# Patient Record
Sex: Female | Born: 1940 | Race: White | Hispanic: No | Marital: Married | Smoking: Former smoker
Health system: Southern US, Community
[De-identification: ages and names within clinical notes are randomized; demographics above are authoritative.]

## PROBLEM LIST (undated history)

## (undated) DIAGNOSIS — Z9889 Other specified postprocedural states: Secondary | ICD-10-CM

## (undated) DIAGNOSIS — R112 Nausea with vomiting, unspecified: Secondary | ICD-10-CM

## (undated) DIAGNOSIS — I209 Angina pectoris, unspecified: Secondary | ICD-10-CM

## (undated) DIAGNOSIS — E119 Type 2 diabetes mellitus without complications: Secondary | ICD-10-CM

## (undated) DIAGNOSIS — C679 Malignant neoplasm of bladder, unspecified: Secondary | ICD-10-CM

## (undated) DIAGNOSIS — I10 Essential (primary) hypertension: Secondary | ICD-10-CM

## (undated) DIAGNOSIS — E785 Hyperlipidemia, unspecified: Secondary | ICD-10-CM

## (undated) DIAGNOSIS — K219 Gastro-esophageal reflux disease without esophagitis: Secondary | ICD-10-CM

## (undated) DIAGNOSIS — I2510 Atherosclerotic heart disease of native coronary artery without angina pectoris: Secondary | ICD-10-CM

## (undated) DIAGNOSIS — T753XXA Motion sickness, initial encounter: Secondary | ICD-10-CM

## (undated) DIAGNOSIS — N3289 Other specified disorders of bladder: Secondary | ICD-10-CM

## (undated) DIAGNOSIS — R7303 Prediabetes: Secondary | ICD-10-CM

## (undated) DIAGNOSIS — I4891 Unspecified atrial fibrillation: Secondary | ICD-10-CM

## (undated) DIAGNOSIS — R0600 Dyspnea, unspecified: Secondary | ICD-10-CM

## (undated) DIAGNOSIS — K769 Liver disease, unspecified: Secondary | ICD-10-CM

## (undated) HISTORY — DX: Liver disease, unspecified: K76.9

## (undated) HISTORY — PX: CHOLECYSTECTOMY: SHX55

## (undated) HISTORY — PX: APPENDECTOMY: SHX54

## (undated) HISTORY — PX: TOE SURGERY: SHX1073

## (undated) HISTORY — DX: Gastro-esophageal reflux disease without esophagitis: K21.9

## (undated) HISTORY — DX: Other specified disorders of bladder: N32.89

## (undated) HISTORY — DX: Malignant neoplasm of bladder, unspecified: C67.9

## (undated) HISTORY — DX: Type 2 diabetes mellitus without complications: E11.9

## (undated) SURGERY — Surgical Case
Anesthesia: *Unknown

---

## 2005-06-30 ENCOUNTER — Ambulatory Visit: Payer: Self-pay | Admitting: Internal Medicine

## 2006-06-28 ENCOUNTER — Ambulatory Visit: Payer: Self-pay | Admitting: Internal Medicine

## 2006-08-18 ENCOUNTER — Emergency Department: Payer: Self-pay | Admitting: General Practice

## 2006-10-18 ENCOUNTER — Ambulatory Visit: Payer: Self-pay | Admitting: Gastroenterology

## 2007-02-11 ENCOUNTER — Ambulatory Visit: Payer: Self-pay | Admitting: Ophthalmology

## 2007-07-01 ENCOUNTER — Ambulatory Visit: Payer: Self-pay | Admitting: Internal Medicine

## 2008-07-23 ENCOUNTER — Ambulatory Visit: Payer: Self-pay | Admitting: Internal Medicine

## 2009-07-27 ENCOUNTER — Ambulatory Visit: Payer: Self-pay | Admitting: Internal Medicine

## 2010-07-28 ENCOUNTER — Ambulatory Visit: Payer: Self-pay | Admitting: Internal Medicine

## 2011-07-31 ENCOUNTER — Ambulatory Visit: Payer: Self-pay | Admitting: Internal Medicine

## 2011-12-15 ENCOUNTER — Ambulatory Visit: Payer: Self-pay | Admitting: Gastroenterology

## 2011-12-18 LAB — PATHOLOGY REPORT

## 2011-12-21 ENCOUNTER — Ambulatory Visit: Payer: Self-pay | Admitting: Podiatry

## 2012-06-06 ENCOUNTER — Inpatient Hospital Stay: Payer: Self-pay | Admitting: Internal Medicine

## 2012-06-06 LAB — URINALYSIS, COMPLETE
Bilirubin,UR: NEGATIVE
Glucose,UR: NEGATIVE mg/dL (ref 0–75)
Nitrite: NEGATIVE
Ph: 5 (ref 4.5–8.0)
Protein: 30
Squamous Epithelial: 12
WBC UR: 27 /HPF (ref 0–5)

## 2012-06-06 LAB — COMPREHENSIVE METABOLIC PANEL
Alkaline Phosphatase: 98 U/L (ref 50–136)
Anion Gap: 11 (ref 7–16)
Bilirubin,Total: 1.8 mg/dL — ABNORMAL HIGH (ref 0.2–1.0)
Calcium, Total: 9.2 mg/dL (ref 8.5–10.1)
Chloride: 99 mmol/L (ref 98–107)
Co2: 24 mmol/L (ref 21–32)
EGFR (Non-African Amer.): 32 — ABNORMAL LOW
Osmolality: 279 (ref 275–301)
Potassium: 3.7 mmol/L (ref 3.5–5.1)
Total Protein: 7.7 g/dL (ref 6.4–8.2)

## 2012-06-06 LAB — CBC WITH DIFFERENTIAL/PLATELET
Basophil #: 0.1 10*3/uL (ref 0.0–0.1)
Basophil %: 0.4 %
HGB: 16.3 g/dL — ABNORMAL HIGH (ref 12.0–16.0)
Lymphocyte #: 1.9 10*3/uL (ref 1.0–3.6)
MCH: 33.1 pg (ref 26.0–34.0)
MCHC: 35 g/dL (ref 32.0–36.0)
Monocyte #: 1.7 x10 3/mm — ABNORMAL HIGH (ref 0.2–0.9)
Monocyte %: 11.6 %
Neutrophil #: 11.1 10*3/uL — ABNORMAL HIGH (ref 1.4–6.5)
RBC: 4.93 10*6/uL (ref 3.80–5.20)
RDW: 13 % (ref 11.5–14.5)

## 2012-06-06 LAB — TROPONIN I: Troponin-I: <0.02 ng/mL

## 2012-06-06 LAB — LIPASE, BLOOD: Lipase: 101 U/L (ref 73–393)

## 2012-06-07 LAB — CBC WITH DIFFERENTIAL/PLATELET
Basophil #: 0 10*3/uL (ref 0.0–0.1)
HCT: 36.5 % (ref 35.0–47.0)
Lymphocyte #: 1.4 10*3/uL (ref 1.0–3.6)
MCH: 33.4 pg (ref 26.0–34.0)
MCHC: 35.5 g/dL (ref 32.0–36.0)
MCV: 94 fL (ref 80–100)
Monocyte #: 1 x10 3/mm — ABNORMAL HIGH (ref 0.2–0.9)
Monocyte %: 12.7 %
Neutrophil #: 5.6 10*3/uL (ref 1.4–6.5)
Neutrophil %: 68.5 %
Platelet: 183 10*3/uL (ref 150–440)
RDW: 13 % (ref 11.5–14.5)
WBC: 8.2 10*3/uL (ref 3.6–11.0)

## 2012-06-07 LAB — HEPATIC FUNCTION PANEL A (ARMC)
Albumin: 2.6 g/dL — ABNORMAL LOW (ref 3.4–5.0)
Bilirubin, Direct: 0.2 mg/dL (ref 0.00–0.20)
Bilirubin,Total: 0.9 mg/dL (ref 0.2–1.0)
Total Protein: 5.6 g/dL — ABNORMAL LOW (ref 6.4–8.2)

## 2012-06-07 LAB — BASIC METABOLIC PANEL
Anion Gap: 5 — ABNORMAL LOW (ref 7–16)
Calcium, Total: 8.3 mg/dL — ABNORMAL LOW (ref 8.5–10.1)
Co2: 27 mmol/L (ref 21–32)
Creatinine: 1.04 mg/dL (ref 0.60–1.30)
EGFR (African American): >60
Glucose: 153 mg/dL — ABNORMAL HIGH (ref 65–99)
Osmolality: 278 (ref 275–301)
Sodium: 137 mmol/L (ref 136–145)

## 2012-06-07 LAB — WBCS, STOOL

## 2012-06-07 LAB — MAGNESIUM: Magnesium: 1.9 mg/dL

## 2012-06-08 LAB — CBC WITH DIFFERENTIAL/PLATELET
Eosinophil #: 0.1 10*3/uL (ref 0.0–0.7)
Eosinophil %: 2.1 %
Lymphocyte #: 1.2 10*3/uL (ref 1.0–3.6)
Lymphocyte %: 21.5 %
MCH: 34.3 pg — ABNORMAL HIGH (ref 26.0–34.0)
MCHC: 36.2 g/dL — ABNORMAL HIGH (ref 32.0–36.0)
MCV: 95 fL (ref 80–100)
Monocyte #: 0.9 x10 3/mm (ref 0.2–0.9)
Neutrophil #: 3.3 10*3/uL (ref 1.4–6.5)
Neutrophil %: 59.3 %
Platelet: 158 10*3/uL (ref 150–440)
RBC: 3.44 10*6/uL — ABNORMAL LOW (ref 3.80–5.20)

## 2012-06-08 LAB — COMPREHENSIVE METABOLIC PANEL
Anion Gap: 7 (ref 7–16)
BUN: 5 mg/dL — ABNORMAL LOW (ref 7–18)
Bilirubin,Total: 0.5 mg/dL (ref 0.2–1.0)
Calcium, Total: 7.9 mg/dL — ABNORMAL LOW (ref 8.5–10.1)
Creatinine: 0.9 mg/dL (ref 0.60–1.30)
EGFR (African American): >60
EGFR (Non-African Amer.): >60
Glucose: 113 mg/dL — ABNORMAL HIGH (ref 65–99)
Osmolality: 281 (ref 275–301)
Potassium: 3.9 mmol/L (ref 3.5–5.1)
SGOT(AST): 35 U/L (ref 15–37)
Sodium: 142 mmol/L (ref 136–145)

## 2012-06-08 LAB — URINE CULTURE

## 2012-06-09 LAB — BASIC METABOLIC PANEL
Anion Gap: 6 — ABNORMAL LOW (ref 7–16)
Calcium, Total: 8.2 mg/dL — ABNORMAL LOW (ref 8.5–10.1)
Co2: 26 mmol/L (ref 21–32)
EGFR (African American): >60
Glucose: 147 mg/dL — ABNORMAL HIGH (ref 65–99)
Osmolality: 280 (ref 275–301)
Sodium: 141 mmol/L (ref 136–145)

## 2012-06-09 LAB — STOOL CULTURE

## 2012-06-25 LAB — STOOL CULTURE

## 2012-07-31 ENCOUNTER — Ambulatory Visit: Payer: Self-pay | Admitting: Internal Medicine

## 2013-10-22 ENCOUNTER — Ambulatory Visit: Payer: Self-pay | Admitting: Internal Medicine

## 2013-12-22 ENCOUNTER — Ambulatory Visit: Payer: Self-pay | Admitting: Urology

## 2014-01-12 ENCOUNTER — Ambulatory Visit: Payer: Self-pay | Admitting: Urology

## 2014-01-16 LAB — PATHOLOGY REPORT

## 2014-01-27 ENCOUNTER — Ambulatory Visit: Payer: Self-pay | Admitting: Urology

## 2014-03-04 ENCOUNTER — Ambulatory Visit: Payer: Self-pay | Admitting: Ophthalmology

## 2014-04-01 DIAGNOSIS — K219 Gastro-esophageal reflux disease without esophagitis: Secondary | ICD-10-CM | POA: Insufficient documentation

## 2014-04-01 DIAGNOSIS — E119 Type 2 diabetes mellitus without complications: Secondary | ICD-10-CM | POA: Insufficient documentation

## 2014-07-31 ENCOUNTER — Ambulatory Visit: Payer: Self-pay | Admitting: Internal Medicine

## 2014-08-20 ENCOUNTER — Ambulatory Visit: Payer: Self-pay | Admitting: Urology

## 2014-08-20 LAB — COMPREHENSIVE METABOLIC PANEL
ALK PHOS: 79 U/L
ANION GAP: 6 — AB (ref 7–16)
Albumin: 3.7 g/dL (ref 3.4–5.0)
BUN: 16 mg/dL (ref 7–18)
Bilirubin,Total: 0.7 mg/dL (ref 0.2–1.0)
CALCIUM: 9.3 mg/dL (ref 8.5–10.1)
CREATININE: 0.99 mg/dL (ref 0.60–1.30)
Chloride: 105 mmol/L (ref 98–107)
Co2: 29 mmol/L (ref 21–32)
GFR CALC NON AF AMER: 59 — AB
Glucose: 116 mg/dL — ABNORMAL HIGH (ref 65–99)
OSMOLALITY: 282 (ref 275–301)
Potassium: 4.6 mmol/L (ref 3.5–5.1)
SGOT(AST): 35 U/L (ref 15–37)
SGPT (ALT): 55 U/L
Sodium: 140 mmol/L (ref 136–145)
TOTAL PROTEIN: 6.8 g/dL (ref 6.4–8.2)

## 2014-09-04 ENCOUNTER — Ambulatory Visit: Payer: Self-pay | Admitting: Urology

## 2014-11-19 ENCOUNTER — Inpatient Hospital Stay: Payer: Self-pay | Admitting: Surgery

## 2015-01-19 NOTE — Discharge Summary (Signed)
PATIENT NAME:  Adriana Anderson, Adriana Anderson MR#:  [id number] DATE OF BIRTH:  09-18-1941  DATE OF ADMISSION:  06/06/2012 DATE OF DISCHARGE:  06/10/2012  HISTORY OF PRESENT ILLNESS: Ms. Satterfield is a 74 year old white lady who presented to the Emergency Room complaining of 4 to 5 days of nausea, vomiting, and diarrhea. She also had blood per rectum because of the diffuse diarrhea. In the Emergency Room, a CT scan of the abdomen was suggestive of colitis and she was therefore admitted.   PAST MEDICAL HISTORY:  1. Gastroesophageal reflux disease.  2. Osteopenia.  3. History of urinary incontinence.   ALLERGIES: The patient was noted to be allergic to sulfa.   ADMISSION MEDICATIONS:   1. Omeprazole 20 mg daily.  2. Calcium plus vitamin D b.i.d.   ADMISSION PHYSICAL EXAMINATION: As described by the admitting physician revealed a temperature of 97.1,  pulse of 111,  respirations 20, and blood pressure 144/84. Mucous membranes were noted to be dry. Abdomen was soft but there was tenderness without rebound in both lower quadrants. Bowel sounds were somewhat hyperactive. Rectal exam was not described by the admitting physician.   LABS/STUDIES: Admission CBC showed a hemoglobin of 16.3 with hematocrit of 46.6. Platelet count was 250,000. White count was 14,700. Admission urinalysis showed 1+ blood and 30 mg/dl of protein. There was 3+ leukocyte esterase. Microscopic exam showed 27 WBCs per high-powered field and 1+ bacteria. Admission electrocardiogram showed a sinus tachycardia with ST-T wave changes in the inferior leads. Abdominal CT of the abdomen and pelvis showed thickening of the wall of the colon in the transverse colon distally. It was suggestive of an infectious colitis.    HOSPITAL COURSE: The patient was admitted to the regular medical floor where she was rehydrated with IV fluids. She was placed on a restricted diet plus IV antibiotics pending culture reports. Stool for WBCs showed  80% WBCs. 20% were  mononuclear cells. Stool for C. difficile was negative. Stool culture was positive for Salmonella, sensitive to ampicillin, ciprofloxacin, sulfa, and levofloxacin.  Urine culture showed mixed bacterial organisms suggestive of contamination.   HOSPITAL COURSE: With rehydration and IV antibiotics the patient improved rather quickly. She was eventually switched to p.o. antibiotics and had her diet progressed. She was ambulated without difficulty.   DISCHARGE DIAGNOSIS: Salmonella dysentery.   DISCHARGE MEDICATIONS: The patient was discharged on Levaquin 500 mg daily for an additional seven days.   DISCHARGE DISPOSITION: The patient was discharged on a regular diet, but was advised to eat light for the first meal. She had no activity restrictions. She is to be followed up in the office in 4 to 6 weeks.     ____________________________ Hewitt Blade Sarina Ser, MD jbw:bjt D: 06/23/2012 16:03:02 ET T: 06/25/2012 09:49:02 ET JOB#: [id number]  cc: Jenny Reichmann B. Sarina Ser, MD, <Dictator> Lottie Mussel III MD ELECTRONICALLY SIGNED 06/25/2012 12:25

## 2015-01-19 NOTE — Consult Note (Signed)
PATIENT NAME:  Adriana Anderson, Adriana Anderson MR#:  [id number] DATE OF BIRTH:  07/17/1941  DATE OF CONSULTATION:  06/06/2012  CONSULTING PHYSICIAN:  Payton Emerald, NP/Paul Oh, MD PRIMARY CARE DOCTOR: Hewitt Blade. Sarina Ser, MD ATTENDING: Serita Grit, MD  REASON FOR CONSULTATION: Colitis.  HISTORY OF PRESENT ILLNESS: Adriana Anderson is a 74 year old Caucasian female who presented to [facility] emergency room today with a 5-day history of change in bowel habits. Sunday, acute onset of diarrhea with bowel movements being every 15 minutes, consistency very watery. Her normal bowel pattern is once a day. Known history of hemorrhoids and does notice a speck of bright red blood at times with straining to defecate. Personal history of colonic polyps in the past. Most recent colonoscopy was performed by Dr. Loistine Simas in 2008, unremarkable findings. For 5 days she has had loss of appetite. For 2 days she has really had no substantial chloric intake. Severe cramping to lower abdomen bilateral quadrants. Bowel habit has changed to maybe once an hour at this time but has been continuing a day and overnight. Saturday, low-grade fever, Sunday chills. This morning evidence of bright red blood, several occurrences yesterday, was pinkish in color initially.  Cramping still remains very prominent to lower abdomen with defecation, is mildly relieved at times, associated nausea, vomited Sunday, 10-pound weight loss since Sunday. No chest pain. No shortness of breath, dizziness. Feeling like she is going to pass out this morning with defecation.   No recent antibiotic use, travelled to [place], [place] August 15 through August 18.  Did consume oysters, fried oysters as well as crab cake and also ate oyster stew a week ago. She does live [place] a farm, has donkey, horses, chickens, goats, cows, cats, and dogs. She is not usually the primary caretaker of the animals.     PAST MEDICAL HISTORY: Urinary incontinence, gastroesophageal reflux disease,  hyperglycemia, osteopenia.  PAST SURGICAL HISTORY: Toe surgery.   HOME MEDICATIONS:  1. Lomotil 0.025 mg/2.5 mg 1 tablet every 6 hours as needed for diarrhea.  2. Calcium plus vitamin D 1 tablet twice a day.  3. Omeprazole 20 mg daily. 4. Promethazine 25 mg every 6 hours as needed. 5. Detrol 25 mg 1 tablet twice a day.   ALLERGIES: Sulfa.   FAMILY HISTORY: Father history of GU cancer. Uncle, lung cancer.   SOCIAL HISTORY: No tobacco use. Social alcohol consumption. Married.   REVIEW OF SYSTEMS: CONSTITUTIONAL: Significant for fever, fatigue, weakness, 10-pound weight loss since Sunday. EYES: No blurred vision, double vision, redness. Does have cataracts. No glaucoma. ENT: No tinnitus. No ear pain, hearing loss. RESPIRATORY: No coughing, no wheezing. CARDIOVASCULAR: No chest pain, orthopnea, no heart palpitations. Significant for dizziness. GI: See history of present illness. GU: No dysuria, hematuria. Significant for urinary incontinence. ENDOCRINE: No polyuria, nocturia, thyroid problems. HEME/LYMPH: Denies any anemia, easy bruising or bleeding. SKIN: No lesions. No rashes. MUSCULOSKELETAL: No neuralgias or myalgias. NEUROLOGIC: No numbness, no cerebrovascular accident, history of transient ischemic attacks, seizures. PSYCH: No depression. No anxiety.   PHYSICAL EXAMINATION:  VITAL SIGNS: Temperature is 98.2 with a pulse 101, respirations are 20, blood pressure is 148/72 with a pulse oximetry of 97%.    GENERAL: Well-developed, well nourished 74 year old Caucasian female, no acute distress noted but does appear mildly ill.   HEENT: Normocephalic, atraumatic. Pupils equal, reactive to light. Conjunctiva clear. Sclerae anicteric.   NECK: Supple. Trachea midline. No lymphadenopathy or thyromegaly.   PULMONARY: Symmetric rise and fall of chest. Clear to auscultation  throughout.   CARDIOVASCULAR: Regular rhythm, S1, S2. No murmurs, no gallops.   ABDOMEN: Soft, nondistended. Bowel sounds  hyperactive. No bruits. No masses. Marked discomfort noted bilateral lower quadrants of abdomen.   RECTAL: Deferred.   MUSCULOSKELETAL: Moving all 4 extremities. No contractures. No clubbing.   EXTREMITIES: No edema.   PSYCH: Alert and oriented x4. Memory grossly intact. Appropriate affect and mood.   LABORATORY/DIAGNOSTICS: Chemistry panel: Glucose is 149, BUN is 35, creatinine  is 1.62, sodium is 134. EGFR is 32, otherwise within normal limits. Lipase is 101. Hepatic panel: Total protein is 7.7, albumin is 3.7, total bilirubin is 1.8, alkaline phosphatase is 98, AST is 40, and ALT is 54. Troponin is less than 0.02. CBC: WBC count is 14.7, hemoglobin is 16.3, otherwise within normal limits. Differential revealed neutrophil number elevated at 11.1 with a monocyte elevation of 1.7. Antibody screen negative. ABO group is A+. C. difficile is negative. Urinalysis:  +1 blood, +3 leukocytes, protein is 30 mg/dL, RBC is 4 per high-power, WBC is 27 per high-power field, hyaline casts are 202 per low-power field.   CT scan abdomen and pelvis without contrast revealed thickening of the wall of the colon from the transverse colon distally to the rectum. A small amount of increased density is noted in the pericolonic fat predominantly associated with the descending colon. No evidence of an abscess or free air, no evidence of bowel obstruction. Urinary bladder, uterus, and adnexal structures are normal in appearance. The terminal ileum demonstrates minimal wall thickening, possibly related to incomplete distention wall thickening of the ascending colon, appears to be less throughout the remainder of the colon. Stomach is mildly distended with contrast. The jejunum/ ileum does not exhibit a significant luminal narrowing or wall thickening.   IMPRESSION: Acute onset of abdominal pain bilateral lower quadrants with diarrhea. CT scan of abdomen and pelvis without contrast revealing evidence of colitis. Suspect probably  infectious. Ischemic is also a differential.   PLAN: The patient's presentation was discussed with Dr. Verdie Shire. Pending stool studies which have been ordered, comprehensive stool, stool for WBC. Additionally added ova and parasite as well as giardia.  We will have patient continue with ciprofloxacin as well as metronidazole as  ordered. We will continue to monitor laboratory studies given the evidence of lower GI bleed again, probable in correlation with infectious process versus ischemic.        These services provided by Payton Emerald, NP, under collaborative agreement with Verdie Shire, MD.      ____________________________ Payton Emerald, NP dsh:vtd D: 06/06/2012 17:22:09 ET T: 06/07/2012 09:44:39 ET JOB#: [id number]  cc: Payton Emerald, NP, <Dictator> Payton Emerald MD ELECTRONICALLY SIGNED 06/11/2012 14:35

## 2015-01-19 NOTE — Consult Note (Signed)
Brief Consult Note: Diagnosis: Acute onset of abdominal pain with associated change in bowel habits.  Experiencing diarrhea with evidence of lower GI bleed.  CT scan of abdomen and pelvis revealed evidence of colitis.  Probable infectious in nature but ischemic also of consideration.   Consult note dictated.   Orders entered.   Discussed with Attending MD.   Comments: Patient's presentation discussed with Dr. Verdie Shire.  Pending stool study results.  Comprehensive stool, Stool for WBC and O&P with Giardia added.  Diffential inclusive of infectious vs ischemic.  Continue with current antibiotic therapy.  Will continue to monitor hemodynamic status as well as laboratory studies.  Transfuse as necessary.  Electronic Signatures: Payton Emerald (NP)  (Signed 05-Sep-13 17:24)  Authored: Brief Consult Note   Last Updated: 05-Sep-13 17:24 by Payton Emerald (NP)

## 2015-01-19 NOTE — H&P (Signed)
PATIENT NAME:  Adriana Anderson, Adriana Anderson MR#:  [id number] DATE OF BIRTH:  [birth year]  DATE OF ADMISSION:  06/06/2012  PRIMARY CARE PHYSICIAN: Dr. Gilford Rile, III ED REFERRING PHYSICIAN: Dr. Jimmye Norman   CHIEF COMPLAINT: Diarrhea, abdominal pain, bright red blood per rectum.   HISTORY OF PRESENT ILLNESS: The patient is a 74 year old Caucasian female with history of urinary incontinence, gastroesophageal reflux disease, and osteoporosis who states that she was doing well until last Saturday when she went to an auction. She reports that she did not do anything unusual, she did eat a sausage dog there on Saturday, then that evening did not feel well and then subsequently the next day started having diarrhea. She reports that the diarrhea was basically severe and everything she ate started going through her.  Nobody else in the family is sick.  She stopped eating two days ago because of fear that anything she ate would cause her to have diarrhea. She also had felt hot but did not check her temperature on Sunday. She reports that the diarrhea persisted and then two days ago started noticing some bright red blood per rectum. It was just minimal on the first day and then today was worse and she also started feeling dizzy earlier today. Therefore, she came to the Emergency Department.  In the Emergency Department she had a  CT scan of the abdomen which showed colitis. She otherwise denies any chest pains. No syncope. She did feel like she was going to pass out. The patient had a colonoscopy in 2008 which just showed a colonic polyp.  PAST MEDICAL HISTORY:  1. Urinary incontinence.  2. Gastroesophageal reflux disease.  3. Osteopenia.   PAST SURGICAL HISTORY: None.   ALLERGIES: Sulfa.  CURRENT HOME MEDICATIONS:  1. Atropine diphenoxylate 0.025 mg/2.5, 1 tab p.o. q. 6 p.r.n. for diarrhea. 2. Calcium plus vitamin D 1 tab p.o. b.i.d. 3. Omeprazole 20 mg daily.  4. Promethazine 25 q. 6 p.r.n. nausea, vomiting.  5. Detrol 2  mg 1 tab p.o. b.i.d.   SOCIAL HISTORY: Does not smoke. Does not drink. No drugs.   FAMILY HISTORY: No history of colon cancer or other inflammatory bowel disease.   REVIEW OF SYSTEMS:  CONSTITUTIONAL: Complains of subjective fever, fatigue, and weakness, abdominal pain in the lower abdomen. No weight loss. No weight gain. EYES: No blurred or double vision. No pain. No redness. No inflammation. Does have cataracts. No glaucoma. ENT: No tinnitus. No ear pain. No hearing loss. No seasonal or year-round allergies. No difficulty swallowing. RESPIRATORY: No cough. No wheezing. No hemoptysis. No chronic obstructive pulmonary disease. No tuberculosis. CARDIOVASCULAR: No chest pain. No orthopnea. No edema. No arrhythmia.  He did feel dizzy earlier. GI: No vomiting. Has had nausea and diarrhea as above. Abdominal pain in the right and left lower quadrants. No hematemesis. Complains of bright red blood per rectum. Has gastroesophageal reflux disease. No history of irritable bowel syndrome. No jaundice. GU: Denies any dysuria, hematuria, or renal calculus. Does have urinary incontinence. ENDO: Denies any polyuria, nocturia, or thyroid problems. HEME/LYMPH: Denies any anemia, easy bruisability, or bleeding. SKIN: No acne. No rash. No changes in mole, hair, or skin. MUSCULOSKELETAL: Denies any pain in the neck, back, or shoulder. NEURO: No numbness. No cerebrovascular accident. No transient ischemic attack. No seizures. PSYCHIATRIC: No anxiety. No insomnia. No ADD. No OCD.   PHYSICAL EXAMINATION:  VITAL SIGNS: Temperature 97.1, pulse 111, respirations 20, blood pressure 144/84, O2 99%.   GENERAL: The patient is a well developed,  well nourished Caucasian female, appears comfortable, in no acute distress.   HEENT: Head atraumatic, normocephalic. Pupils equally round, reactive to light and accommodation. There is no conjunctival pallor. No scleral icterus.  Oropharynx is dry. Nasal exam shows no drainage or ulceration.    NECK: No thyromegaly. No carotid bruits.   CARDIOVASCULAR: Regular rate and rhythm. No murmurs, rubs, clicks, or gallops. PMI is not displaced.   LUNGS: No accessory muscle usage. Clear to auscultation bilaterally without any rales, rhonchi, or wheezing.   ABDOMEN: Soft. There is tenderness in the bilateral lower quadrants. There is no guarding, no rebound. Hyperactive bowel sounds.   EXTREMITIES: No clubbing, cyanosis, or edema.   SKIN: No rash.   LYMPHATICS: No lymph nodes palpable.   VASCULAR: Good DP, PT pulses.   PSYCHIATRIC: Not anxious or depressed.   NEUROLOGICAL: Awake, alert, oriented times three. No focal deficits.   SKIN: No rash.   EVALUATIONS: CT scan of the abdomen shows findings consistent with diffuse thickening of the wall of the colon from the transverse colon distally. No small bowel or stomach abnormality noted. Mild distention of the gallbladder. No evidence of gallbladder thickening. C. difficile is negative. Urinalysis shows leukocytes 3+, WBCs 27. WBC 14.7, hemoglobin 16.3, platelet count 250, glucose 149, BUN 35, creatinine 1.62, sodium 134, potassium 3.7, chloride 99, CO2 24. LFTs showed a bilirubin total that was slightly elevated at 1.8. AST 40, lipase 101. Troponin less than 0.02. EKG showed nonspecific T-wave changes.   ASSESSMENT AND PLAN: The patient is a 74 year old Caucasian female with a history of osteoporosis, urinary incontinence, and gastroesophageal reflux disease who presents with abdominal pain and diarrhea times four to five days with bright red blood per rectum.  1. Acute colitis: Differential diagnosis includes possible infectious. Her stool is currently negative for Clostridium difficile. We will get comprehensive stool cultures. We will place her on Cipro and Flagyl for empiric antibiotics. Also, this could be ischemic colitis. We will provide her with supportive care and monitor her blood pressure. Give her IV fluids. We will have GI  evaluate the patient. Depending on her recovery she may need further evaluation with a sigmoidoscopy with biopsy or colonoscopy.  2. Bright red blood per rectum, possibly due to acute colitis. Could also be related to just her hemorrhoids flaring due to recurrent diarrhea. Her hemoglobin is currently stable. We will follow her hemoglobin and hematocrit.  3. Dehydration due to diarrhea. We will give her IV fluids.  4. Abnormal bilirubin total. We will repeat LFTs in the a.m.  5. Gastroesophageal reflux disease. We will continue PPIs.  6. Acute renal failure. We will give her IV fluids. Follow her renal function.  7. We will place her on TEDs for deep vein thrombosis prophylaxis.   Please note the patient is Dr. Thomes Dinning patient.  We will transfer her service later today to Dr. Gilford Rile who will assume her care in the morning.  TIME SPENT:  45 minutes.   ____________________________ Lafonda Mosses Posey Pronto, MD shp:bjt D: 06/06/2012 52:77:82 ET T: 06/06/2012 15:34:26 ET JOB#: [id number]  cc: Keith Cancio H. Posey Pronto, MD, <Dictator> John B. Sarina Ser, MD Alric Seton MD ELECTRONICALLY SIGNED 06/06/2012 20:40

## 2015-01-19 NOTE — Consult Note (Signed)
CC: diarrhea, presumed infectious.  Pt abd feels better, mild tenderness in suprapubic and LLQ, no pain with coughing.  Slept 7 hours without diarrhea last night.  Eating real food at this time.  She seems to be responding to the antibiotics and likely will not need a colonoscopy at this time.  Will decide for sure tomorrow.  Electronic Signatures: Manya Silvas (MD)  (Signed on 07-Sep-13 13:00)  Authored  Last Updated: 07-Sep-13 13:00 by Manya Silvas (MD)

## 2015-01-19 NOTE — Consult Note (Signed)
Chief Complaint:   Subjective/Chief Complaint Feeling better with less abd pain and no bleeding but still with diarrhea.   VITAL SIGNS/ANCILLARY NOTES: **Vital Signs.:   06-Sep-13 07:55   Vital Signs Type Routine   Temperature Temperature (F) 97.7   Celsius 36.5   Temperature Source Oral   Pulse Pulse 94   Respirations Respirations 20   Systolic BP Systolic BP 354   Diastolic BP (mmHg) Diastolic BP (mmHg) 77   Mean BP 95   Pulse Ox % Pulse Ox % 98   Pulse Ox Activity Level  At rest   Oxygen Delivery Room Air/ 21 %   Brief Assessment:   Cardiac Regular    Respiratory clear BS    Gastrointestinal mild LLQ abd tenderness   Lab Results: Hepatic:  06-Sep-13 04:53    Bilirubin, Total 0.9   Bilirubin, Direct 0.2 (Result(s) reported on 07 Jun 2012 at 05:36AM.)   Alkaline Phosphatase 68   SGPT (ALT) 44   SGOT (AST) 35   Total Protein, Serum  5.6   Albumin, Serum  2.6  Routine Chem:  06-Sep-13 04:53    Glucose, Serum  153   BUN 17   Creatinine (comp) 1.04   Sodium, Serum 137   Potassium, Serum  3.3   Chloride, Serum 105   CO2, Serum 27   Calcium (Total), Serum  8.3   Anion Gap  5   Osmolality (calc) 278   eGFR (African American) >60   eGFR (Non-African American)  54 (eGFR values <68m/min/1.73 m2 may be an indication of chronic kidney disease (CKD). Calculated eGFR is useful in patients with stable renal function. The eGFR calculation will not be reliable in acutely ill patients when serum creatinine is changing rapidly. It is not useful in  patients on dialysis. The eGFR calculation may not be applicable to patients at the low and high extremes of body sizes, pregnant women, and vegetarians.)   Magnesium, Serum 1.9 (1.8-2.4 THERAPEUTIC RANGE: 4-7 mg/dL TOXIC: > 10 mg/dL  -----------------------)  Routine Hem:  06-Sep-13 04:53    WBC (CBC) 8.2   RBC (CBC) 3.87   Hemoglobin (CBC) 12.9   Hematocrit (CBC) 36.5   Platelet Count (CBC) 183   MCV 94   MCH 33.4    MCHC 35.5   RDW 13.0   Neutrophil % 68.5   Lymphocyte % 16.8   Monocyte % 12.7   Eosinophil % 1.5   Basophil % 0.5   Neutrophil # 5.6   Lymphocyte # 1.4   Monocyte #  1.0   Eosinophil # 0.1   Basophil # 0.0 (Result(s) reported on 07 Jun 2012 at 05:16AM.)   Assessment/Plan:  Assessment/Plan:   Assessment Colitis, prob infectious.    Plan Continue Abx. Advance diet as tolerated. If no improvement, colon on Monday. If improved, then no further GI w/u now. Dr. EVira Agarto see patient over the weekend. THanks.   Electronic Signatures: OVerdie Shire(MD)  (Signed 06-Sep-13 13:02)  Authored: Chief Complaint, VITAL SIGNS/ANCILLARY NOTES, Brief Assessment, Lab Results, Assessment/Plan   Last Updated: 06-Sep-13 13:02 by OVerdie Shire(MD)

## 2015-01-19 NOTE — Consult Note (Signed)
Pt seen and examined. See Adriana Anderson's notes. Signif diarrhea with bleeding. Bleeding has stopped. CT shows left sided colitis. Likely infectious though ischemic also possible based on location of colitis. Agree with cipro/flagyl. Hx of colon polyps. Last colon in 2008. If sxs resolve with Abx, then colon later as outpt. If no relief, then flex sig/colon by Monday. Will follow. Thanks.  Electronic Signatures: Verdie Shire (MD)  (Signed on 05-Sep-13 21:17)  Authored  Last Updated: 05-Sep-13 21:17 by Verdie Shire (MD)

## 2015-01-23 NOTE — Op Note (Signed)
PATIENT NAME:  Adriana Anderson, Adriana Anderson MR#:  [id number] DATE OF BIRTH:  04/19/1941  DATE OF PROCEDURE:  01/12/2014  PREOPERATIVE DIAGNOSIS:  Large midline bladder tumor.  POSTOPERATIVE DIAGNOSIS: Large midline bladder tumor.    PROCEDURE: Cystoscopy with transurethral resection of bladder of a medium to large size bladder tumor.   ANESTHESIA: General.   COMPLICATIONS: None.   ESTIMATED BLOOD LOSS: 30 mL.  DESCRIPTION OF PROCEDURE: With the patient sterilely prepped and draped in supine lithotomy position under good relaxation from a general anesthetic and after an appropriate timeout with discussion of the procedure and the indications for same, the procedure is begun on the patient.  Has a 26-French constant flow irrigation saline resectoscope introduced into the bladder. The tumor is easily seen in the posterior wall extending down almost to the floor of the bladder. It is foul-looking tumor. It is resected utilizing constant irrigation and electrocautery for both cutting and for coagulation and the normal saline allows [place] to operate freely for as long as needed.  there is an extensive> vascular supply of this tumor, but I immediately stop any major bleeders. The resection is complete. The fragments are evacuated out with a syringe and sent to pathology for analysis. Depth of penetration of the resectoscope is good, but I do extend it into fat. I can see bladder muscle in the resectoscope with no fat in the resection. So she tolerates the procedure well and once I completely dried up any oozing or bleeding with electrocautery, I empty the bladder utilizing a 26-French Foley catheter with 15 mL in the balloon. There is nice clear drainage from this catheter with minimal bleeding and at the most light pink in irrigation. So this allows me to put 40 mg of mitomycin into the bladder immediately after I have put Marcaine in the bladder. The bladder is then plugged and she is sent to recovery in satisfactory  condition. The mitomycin will be emptied out in 1 hour. The patient has a B and O suppository placed. Follow up is in the recovery room. I will determine whether she goes home or not.   ____________________________ Janice Coffin. Elnoria Howard, [place] rdh:ce D: 01/12/2014 13:43:17 ET T: 01/12/2014 14:32:15 ET JOB#: [id number]  cc: Janice Coffin. Elnoria Howard, DO, <Dictator> RICHARD D HART DO ELECTRONICALLY SIGNED 01/30/2014 8:15

## 2015-01-23 NOTE — Op Note (Signed)
PATIENT NAME:  Adriana Anderson, Adriana Anderson MR#:  [id number] DATE OF BIRTH:  17-Jun-1941  DATE OF PROCEDURE:  09/04/2014  PREOPERATIVE DIAGNOSIS: Recurrent bladder tumor.   POSTOPERATIVE DIAGNOSIS: Recurrent bladder tumor.   DESCRIPTION OF PROCEDURE: With the patient sterilely prepped and draped in supine lithotomy position for ease of approach to external genitalia and under good relaxation from a general anesthetic, we have a timeout. Timeout is agreed to by all parties present and we begin the procedure.   PROCEDURE: Transurethral resection of bladder tumor medium size bladder tumor.    DESCRIPTION OF PROCEDURE:  The TURBT begins with resection of a tumor on the right lateral floor of the bladder just proximal and over the intermural portion of the right ureter. Resection continues along through this area. I stay pretty shallow. The patient tolerates the procedure well. Bladder fragments are evacuated with suction and sent to pathology. The bladder is checked, 30 mL of Marcaine are placed in the bladder. Fulguration of the base of the tumor has resulted in a complete hemostasis. There is no bleeding from the bladder at this point. B and O suppository is placed and she is sent to recovery in satisfactory condition. The resectoscope was constant irrigation and suction, resectoscope with normal saline irrigation. The normal saline irrigation created a safe milieu for resection of this bladder tumor. There was no fat showing in the base of the tumor. However it was through muscle at 2 or 3 points of this resection. There were no other tumor sites present. The site of the previous tumor was proximal to the site of this possible recurrent tumor. The patient is sent to recovery in satisfactory condition.     ____________________________ Janice Coffin. Elnoria Howard, DO rdh:bu D: 09/04/2014 14:39:47 ET T: 09/04/2014 15:00:09 ET JOB#: [id number]  cc: Janice Coffin. Elnoria Howard, DO, <Dictator> America Sandall D Avi Archuleta DO ELECTRONICALLY SIGNED  09/16/2014 13:12

## 2015-01-25 LAB — SURGICAL PATHOLOGY

## 2015-01-31 NOTE — Consult Note (Signed)
Chief Complaint:  Subjective/Chief Complaint No significant improvement. Still with abdominal pain. For surgery later today.   VITAL SIGNS/ANCILLARY NOTES: **Vital Signs.:   21-Feb-16 05:32  Vital Signs Type Routine  Temperature Temperature (F) 98.6  Celsius 37  Temperature Source oral  Pulse Pulse 101  Respirations Respirations 18  Systolic BP Systolic BP 660  Diastolic BP (mmHg) Diastolic BP (mmHg) 79  Mean BP 103  Pulse Ox % Pulse Ox % 92  Pulse Ox Activity Level  At rest  Oxygen Delivery Room Air/ 21 %   Brief Assessment:  GEN no acute distress   Cardiac Regular   Respiratory clear BS   Gastrointestinal diffuse lower abdominal tenderness with guarding.   Lab Results: Routine Hem:  21-Feb-16 05:02   WBC (CBC)  17.7  RBC (CBC)  3.72  Hemoglobin (CBC) 12.6  Hematocrit (CBC) 35.9  Platelet Count (CBC) 221  MCV 97  MCH 34.0  MCHC 35.1  RDW 12.9  Neutrophil % 80.9  Lymphocyte % 12.3  Monocyte % 6.2  Eosinophil % 0.3  Basophil % 0.3  Neutrophil #  14.3  Lymphocyte # 2.2  Monocyte #  1.1  Eosinophil # 0.1  Basophil # 0.1 (Result(s) reported on 22 Nov 2014 at 05:55AM.)   Assessment/Plan:  Assessment/Plan:  Assessment Enteritis? Ischemia?   Plan For surgery later. Will await results. thanks.   Electronic Signatures: Verdie Shire (MD)  (Signed 21-Feb-16 10:44)  Authored: Chief Complaint, VITAL SIGNS/ANCILLARY NOTES, Brief Assessment, Lab Results, Assessment/Plan   Last Updated: 21-Feb-16 10:44 by Verdie Shire (MD)

## 2015-01-31 NOTE — Consult Note (Signed)
Pt seen and examined. Full consult to follow. Acute and severe abdominal pain at home. CT shows diffuse inflammation in distal SB though TI relatively spared. Overall better on Abx and pain meds though still has diffuse abdominal tenderness with guarding but no rebound. WBC elevated. Hx fo colon polyps. Last colon in 5/13 showed polyps only. Had salmonella colitis later that year. Awaiting stool studies. Agree with  if laparoscopy if no significant improvement. If laparoscopy not planned, could repeat colonoscopy with intubation and evaluation of ileum, though this area may be hard to reach with scope. Will follow. Thanks.   Electronic Signatures: Verdie Shire (MD) (Signed on 20-Feb-16 11:54)  Authored   Last Updated: 20-Feb-16 11:55 by Verdie Shire (MD)

## 2015-01-31 NOTE — Op Note (Signed)
PATIENT NAME:  Adriana Anderson, Adriana Anderson MR#:  [id number] DATE OF BIRTH:  13-Sep-1941  DATE OF PROCEDURE:  11/22/2014  PREOPERATIVE DIAGNOSIS: Acute appendicitis.   POSTOPERATIVE DIAGNOSIS: Acute ruptured appendicitis.  SURGERY: Appendectomy.  SURGEON: Rodena Goldmann III, MD   ANESTHESIA: General.  DESCRIPTION OF PROCEDURE: With the patient in the supine position, after receiving appropriate general anesthesia, the patient's abdomen was prepped with ChloraPrep and draped with sterile towels. The patient was placed in head down, feet up position. A small infraumbilical incision was made in the standard fashion and carried down bluntly through the subcutaneous tissue. A Veress needle was used to cannulate the peritoneal cavity. CO2 was insufflated to appropriate pressure measurements. When approximately 2 liters of CO2 were instilled, the Veress needle was withdrawn. An 11 mm [employer] port was inserted into the peritoneal cavity. Intraperitoneal position was confirmed and CO2 was reinsufflated. A right upper quadrant transverse incision was made and an 11 mm port was inserted under direct vision. The right lower quadrant was investigated. There appeared to be some purulent material and abscess formation in the right lower quadrant with significant adhesions. The presumptive diagnosis of ruptured appendicitis was made. A left lower quadrant transverse incision was made and a 12 mm port inserted under direct vision. The camera was moved to the upper port and dissection carried out through the 2 lower ports. Scissors were used to take down the attachments of the omentum and colon to the pelvic side wall. The appendix was buried along the pelvic side wall, it was clearly ruptured with significant surrounding inflammatory tissue reaction and obvious purulence. Because of the odd angle with the appendix disappearing into the pelvis, the midline port was changed for a 12-port. The base of the appendix was elevated and  divided with a single application of the Endo GIA stapler carrying a blue load. The mesoappendix was divided with 2 applications of Endo GI stapler carrying a white load. The appendix was captured in an Endo Catch apparatus and removed without difficulty. The area was copiously suctioned and irrigated under direct vision with warm saline solution. I felt that with the localized abscess, drainage was appropriate. A separate 5 mm port was inserted in the right lower quadrant and a JP drain brought into one of the other incisions, brought out through the right lower quadrant. It was secured in place with 3-0 nylon and placed into the right pelvic sidewall into the pelvis. The abdomen was then desufflated after closing the left lower quadrant incision with 0-Vicryl under direct vision with the suture passer. The skin incisions were closed with 3-0 and 5-0 nylon. Drain was secured with 3-0 nylon. Dressings were placed sterilely and the patient returned to the recovery room having tolerated the procedure well. Sponge, instrument and needle counts were correct x 2 in the operating room.   ____________________________ Rodena Goldmann III, MD rle:TM D: 11/22/2014 13:57:21 ET T: 11/22/2014 21:02:02 ET JOB#: [id number]  cc: Micheline Maze, MD, <Dictator> John B. Sarina Ser, MD Rodena Goldmann MD ELECTRONICALLY SIGNED 11/25/2014 8:18

## 2015-01-31 NOTE — Consult Note (Signed)
PATIENT NAME:  Adriana Anderson, Adriana Anderson MR#:  [id number] DATE OF BIRTH:  1941-04-30  DATE OF CONSULTATION:  11/19/2014  REFERRING PHYSICIAN:   CONSULTING PHYSICIAN:  Harrell Gave A. Darryll Raju, MD  REASON FOR CONSULTATION: Lower abdominal pain, leukocytosis, tachycardia, and thickened small bowel as well as dilated appendix without periappendiceal changes.   HISTORY OF PRESENT ILLNESS: Adriana Anderson is a pleasant 74 year old female with history of bladder cancer, status post local resection x 2; GERD; and history of infectious colitis many years back. She reported feeling unwell 2 days prior and then woke up at 2 in the morning with acute onset of lower abdominal pain. She says that she also has pain elsewhere going into her upper abdomen. Does report that it is being better. No nausea, vomiting. No diarrhea or constipation. No fevers, chills. White cell count was elevated to 17. CT scan shows thickened loops of small bowel and dilated appendix without periappendiceal changes. No fevers, chills, night sweats, shortness of breath, chest pain, dysuria, or hematuria.   PAST MEDICAL HISTORY:  1.  Bladder cancer status post resection x 2.  2.  History of infectious colitis.  3.  GERD.   4.  Osteoporosis.  5.  Urinary incontinence.  6.  History of tubal ligation surgery.   ALLERGIES: SULFA AND .    HOME MEDICATIONS: VESIcare, omeprazole, Estrace, Carafate, and calcium.   FAMILY HISTORY: Denies any colon cancer or inflammatory bowel disease.   SOCIAL HISTORY: Social alcohol. No tobacco. No drugs.   REVIEW OF SYSTEMS: A 12-point review of systems review of systems obtained, pertinent positives and negatives as above.   PHYSICAL EXAMINATION:  VITAL SIGNS: Temperature 98.6, pulse 116, blood pressure 122/62, respirations 20, 95% on room air.  GENERAL: No acute distress. Alert and oriented x 3.  HEAD: Normocephalic, atraumatic.  EYES: No scleral icterus. No conjunctivitis.  FACE: No obvious face trauma.  Normal external nose. Normal external ears.  CHEST: Lungs clear to auscultation. Moving air well.  HEART: Regular rate and rhythm. No murmurs, rubs, or gallops.  ABDOMEN: Soft, mildly distended, tender to palpation. She said worse is right lower quadrant also has diffuse pain in her suprapubic and left and right upper quadrant areas.  EXTREMITIES: Moves all extremities well. Strength 5/5.  NEUROLOGIC: Cranial nerves II-XII grossly intact.   LABORATORY DATA: White cell count of 17.8 with differential not done. Glucose 172, BUN 13, creatinine is 1.06.   IMAGING: Shows thickened loops of distal ileum with small amount of free fluid in the pelvis, mildly prominent appendix with minimal periappendiceal change.   ASSESSMENT AND PLAN: Adriana Anderson is a pleasant 74 year old with acute onset abdominal pain, thickened small bowel loops as well as appendix without periappendiceal findings. Favor pain is secondary to the small bowel changes. Favor infectious; however, is not having nausea, vomiting, or diarrhea versus ischemic or inflammatory would be strange to have first episode of inflammatory bowel disease now and is not a terminal ileum. Favor ischemic etiology completely, unknown. Would recommend resuscitation and antibiotics. Of note, lactate of 2.6, does not really go with appendicitis. No indication for operation at this time. We will continue to follow.    ____________________________ Glena Norfolk Marialice Newkirk, MD cal:bm D: 11/19/2014 22:45:21 ET T: 11/19/2014 23:04:19 ET JOB#: [id number]  cc: Harrell Gave A. Wrenna Saks, MD, <Dictator> Floyde Parkins MD ELECTRONICALLY SIGNED 12/08/2014 11:25

## 2015-01-31 NOTE — H&P (Signed)
PATIENT NAME:  Adriana Anderson, Adriana Anderson MR#:  [id number] DATE OF BIRTH:  [birth year]  DATE OF ADMISSION:  11/19/2014  PRIMARY CARE PHYSICIAN: Dr. Gilford Rile with [place]: Lower abdominal pain.   HISTORY OF PRESENTING ILLNESS: A 74 year old Caucasian female patient with history of bladder cancer, urinary incontinence, GERD and past history of infectious colitis many years back. Presents to the Emergency Room complaining of acute onset of abdominal pain over the last day. The patient has not had any nausea or vomiting. No diarrhea. She had a normal bowel movement 2 days back. She mentions her abdominal pain is mostly in the lower abdomen and goes up into her upper abdomen. She has received multiple doses of morphine in the Emergency Room and feels a little better at this point, but rates her pain as 10/10 prior to the pain medication.   She had no shortness of breath, lightheadedness.   PAST MEDICAL HISTORY:  1. Infectious colitis.  2. Bladder cancer, status post resection.  3. Osteoporosis.  4. Urinary incontinence.  5. Tubal ligation surgery.   ALLERGIES: SULFA and DPT.   HOME MEDICATIONS:  1. VESIcare 10 mg oral once a day.  2. Omeprazole 20 mg daily.  3. Estrace vaginal cream daily.  4. Carafate 1 gram oral 4 times a day.  5. Calcium with vitamin D 1 tablet daily.   FAMILY HISTORY: No history of colon cancer or inflammatory bowel disease.   SOCIAL HISTORY: The patient does not smoke but has an occasional wine. No illicit drugs. Lives at home with husband, walks on her own.   REVIEW OF SYSTEMS:  CONSTITUTIONAL: Complains of fatigue.  EYES: No blurred vision, pain, redness.  ENT: No tinnitus, ear pain, hearing loss. RESPIRATORY: No cough, wheeze, hemoptysis.  CARDIOVASCULAR: No chest pain, orthopnea, edema.  GASTROINTESTINAL: No nausea, vomiting, diarrhea but had abdominal pain.  GENITOURINARY: No dysuria, hematuria, or frequency.  ENDOCRINE: No polyuria, nocturia,  thyroid problems.  HEMATOLOGIC AND LYMPHATIC: No anemia, easy bruising or bleeding. INTEGUMENTARY: No acne, rash, lesion.  MUSCULOSKELETAL: No back pain, arthritis. NEUROLOGIC: No focal numbness, weakness.  PSYCHIATRIC: No anxiety or depression.   PHYSICAL EXAMINATION:  VITAL SIGNS: Pulse 115, respirations 15, blood pressure 134/80, saturating 98% on room air.  GENERAL: Moderately built, Caucasian female patient lying in bed in significant distress secondary to abdominal pain.  PSYCHIATRIC: Alert and oriented x3. Mood and affect appropriate. Judgment intact.  HEENT: Normocephalic, atraumatic. Oral mucosa moist and pink. External ears and nose normal. No pallor. No icterus. Pupils bilaterally equal and reactive to light.  NECK: Supple. No thyromegaly. No palpable lymph nodes. Trachea midline. No carotid bruits or JVD.   CARDIOVASCULAR: S1, S2, tachycardic. No murmurs. Peripheral pulses 2+. No edema.  RESPIRATORY: Normal work of breathing. Clear to auscultation on both sides.  GASTROINTESTINAL: Has diffuse tenderness with maximal tenderness in the right lower quadrant area. No rebound tenderness found. Rigidity in the lower abdomen. Bowel sounds significantly decreased but present.  GENITOURINARY: No CVA tenderness or bladder distention.  SKIN: Warm and dry. No petechiae, rash, ulcers.  MUSCULOSKELETAL: No joint swelling, redness, effusion of the large joints. Normal muscle tone.  NEUROLOGIC: Motor strength 5/5 in upper and lower extremities. Sensation is intact all over. LYMPHATIC: No cervical lymphadenopathy.   LABORATORY STUDIES: WBC 17.8, hemoglobin 15.2, platelets 282,000. Urinalysis with no bacteria, no WBCs. Lactic acid is 2.6. AST, ALT, alkaline phosphatase normal. Glucose 172, BUN 13, creatinine 1.06, sodium 130, potassium 3.8. GFR 54.   CT  scan of the abdomen and pelvis with contrast shows thickening of inflamed distal small bowel. Possible focal enteritis. Mildly prominent appendix at  10 mm with only very minimal periappendiceal change.   ASSESSMENT AND PLAN:  1.  Enteritis, infectious versus ischemic. Also possible changes of her appendix. The case has been discussed with Dr. Pat Patrick of surgery, who will be seeing the patient. The patient will be admitted as an inpatient for IV antibiotics with her sepsis and enteritis. May need surgery if no improvement or if any worsening of the appendix. The possibility of appendicitis is not ruled out. Further decision for surgery per Dr. Pat Patrick. The patient will be given normal saline bolus for her sepsis. For elevated lactate, put her on maintenance fluids. I made the patient n.p.o. Discussed with the patient and her husband at bedside.  2.  Deep vein thrombosis prophylaxis with sequential compression devices. No Lovenox has been started in case the patient needs surgery.   CODE STATUS: Full code.   TIME SPENT ON THIS CASE: 55 minutes.    ____________________________ Leia Alf Jayen Bromwell, MD srs:je D: 11/19/2014 12:39:30 ET T: 11/19/2014 13:14:14 ET JOB#: [id number]  cc: Alveta Heimlich R. Lucile Didonato, MD, <Dictator> John B. Sarina Ser, MD Micheline Maze, MD Neita Carp MD ELECTRONICALLY SIGNED 11/19/2014 16:29

## 2015-01-31 NOTE — Discharge Summary (Signed)
PATIENT NAME:  Adriana Anderson, Adriana Anderson MR#:  [id number] DATE OF BIRTH:  [birth year]  DATE OF ADMISSION:  11/19/2014 DATE OF DISCHARGE:  11/28/2014  BRIEF HISTORY:  Ms. Brier is a 74 year old woman admitted through the Emergency Room with abdominal pain, distended appendix, and swollen loops of small bowel consistent with enteritis, a slightly elevated white blood cell count and a lactate of nearly 3. There was some concern that she had an ischemic colitis. She had been admitted previously in the past with an infectious versus ischemic colitis. Initial work-up suggested this could be a similar problem. She maintained on IV antibiotics and IV rehydration. She did not really improve over the next couple of days. Her exam continued to be remarkable for abdominal tenderness and mild peritoneal irritation. Because of ongoing concern for possible appendicitis she was taken to surgery on the 21st where she underwent laparoscopy where it was noted she had a ruptured appendix. The appendix was removed, a drain placed. She had slow return of bowel function following that procedure. She was maintained on IV antibiotics. By the 27th she was up, active, tolerating a diet with no complaints. Discharged home on Caltrate 600 mg b.i.d., omeprazole 20 mg once a day, Carafate 1 gram four times a day, Zantac 150 mg once a day.   FINAL DISCHARGE DIAGNOSIS: Acute ruptured appendicitis.   SURGERY: Laparoscopic appendectomy.     ____________________________ Micheline Maze, MD rle:at D: 12/02/2014 19:56:36 ET T: 12/03/2014 17:27:39 ET JOB#: [id number]  cc: Rodena Goldmann III, MD, <Dictator> Rodena Goldmann MD ELECTRONICALLY SIGNED 12/07/2014 9:27

## 2015-01-31 NOTE — Consult Note (Signed)
PATIENT NAME:  Adriana Anderson, Adriana Anderson MR#:  [id number] DATE OF BIRTH:  04/01/1941  DATE OF CONSULTATION:  11/21/2014  REFERRING PHYSICIAN:   CONSULTING PHYSICIAN:  Lupita Dawn. Breyden Jeudy, MD  REASON FOR REFERRAL: Abdominal pain, abnormal CT scan.   DESCRIPTION: The patient is a 74 year old, white female, who presents to the Emergency Room with acute abdominal pain for the past 24 hours with no nausea or vomiting. It was mostly in the lower abdomen. Even though she received multiple doses of morphine, her pain was not relieved adequately. Therefore, the decision was made to admit the patient. She had a CT scan of the abdomen that showed an inflamed distal small bowel, though the terminal ileum appeared to be relatively spared. The appendix was mildly prominent at 10 mm; therefore, surgery was consulted. They did not feel that she had appendicitis. Stool was done and stool was negative for Clostridium difficile. The patient was also placed on antibiotics. Overall, she does feel somewhat better today, although she still has diffuse abdominal pain, mostly in the lower abdomen.   She does have a history of colon polyps in the past. Her last colonoscopy was done in May 2013, which did show some polyps. She then later developed Salmonella-induced colitis that required antibiotic treatment.   PAST MEDICAL HISTORY: Notable for infectious colitis, bladder cancer requiring resection. She has tubal ligation surgery and urinary incontinence issues.   ALLERGIES: SHE IS ALLERGIC TO SULFA AND DPT.   HOME MEDICATIONS: Include Prilosec daily, Carafate 4 times a day, calcium and VESIcare.   FAMILY HISTORY: Negative for any GI disorder.   SOCIAL HISTORY: She does not smoke, but drinks alcohol occasionally.   REVIEW OF SYSTEMS: Her main complaint was fatigue. No fevers or chills at home. She had a lot of abdominal pain. The rest of the review of symptoms is noncontributory.   PHYSICAL EXAMINATION:  GENERAL: The patient is in no  acute distress right now.  VITAL SIGNS: She is afebrile. Vital signs are stable.  HEAD AND NECK: Within normal limits.  CARDIAC: Revealed a regular rhythm and rate.  LUNGS: Clear bilaterally.  ABDOMEN: Showed normoactive bowel sounds. There is definite tenderness in the lower abdomen with some guarding, but no rebound. She does have some bowel sounds, as well.  EXTREMITIES: Showed no clubbing, cyanosis, or edema.   LABORATORIES: Her white count is 17.8. It went up to 20.9 the following day despite antibiotics. Liver enzymes are normal, except for bilirubin at 1.3. Laboratories on February 20 showed normal electrolytes. Calcium level at 8.2. Lipase was 163 on admission. Hemoglobin is stable at 12.2. Again, Clostridium difficile was negative.   The patient, in terms of the CT scan, there is a somewhat prominent appendix. There is thickened and inflammed looking distal small bowel loops.   IMPRESSION: This is a patient with an acute bout of abdominal pain with abnormal CT scan suggesting enteritis. The patient is on antibiotics and pain medication, which appears to be helping somewhat. Initial Clostridium difficile was negative. It would be unusual for somebody her age to develop inflammatory bowel disease. It is most likely to be an infectious process, which should respond to antibiotics and pain medications. However, I agree with Dr. Pat Patrick that if the symptoms do not improve significantly, a laparoscopy may be useful. If the laparoscopy is not scheduled, then we can consider doing a colonoscopy later to try to intubate the terminal ileum. Because the distal small intestine is inflamed, but the terminal ileum appears to be  relatively spared, I may not be able to reach this area with the scope. The patient understands that.   Thank you for the referral.     ____________________________ Lupita Dawn. Candace Cruise, MD pyo:JT D: 11/22/2014 08:13:52 ET T: 11/22/2014 10:07:19 ET JOB#: [id number]  cc: Lupita Dawn. Candace Cruise, MD,  <Dictator> Lupita Dawn Beckett Maden MD ELECTRONICALLY SIGNED 11/25/2014 10:51

## 2015-03-15 ENCOUNTER — Other Ambulatory Visit: Payer: Self-pay | Admitting: Urology

## 2015-03-15 DIAGNOSIS — N952 Postmenopausal atrophic vaginitis: Secondary | ICD-10-CM

## 2015-03-15 NOTE — Telephone Encounter (Signed)
No she does not have any future appts.

## 2015-03-15 NOTE — Telephone Encounter (Signed)
Patient may have a refill for her Estrace cream, but she has a h/o TCC.  Does she have a cystoscopy scheduled?

## 2015-03-16 NOTE — Telephone Encounter (Signed)
She needs to be scheduled for a cystoscopy in July for 3 month surveillance for bladder cancer.

## 2015-03-18 NOTE — Telephone Encounter (Signed)
Medication sent to pharmacy. Pt has cysto appt in July. Cw,lpn

## 2015-04-08 DIAGNOSIS — Z8603 Personal history of neoplasm of uncertain behavior: Secondary | ICD-10-CM | POA: Insufficient documentation

## 2015-04-19 ENCOUNTER — Ambulatory Visit: Payer: Self-pay | Admitting: Urology

## 2015-04-21 ENCOUNTER — Ambulatory Visit (INDEPENDENT_AMBULATORY_CARE_PROVIDER_SITE_OTHER): Payer: Self-pay | Admitting: Urology

## 2015-04-21 ENCOUNTER — Encounter: Payer: Self-pay | Admitting: Urology

## 2015-04-21 VITALS — BP 163/83 | HR 86 | Ht 65.0 in | Wt 151.3 lb

## 2015-04-21 DIAGNOSIS — C679 Malignant neoplasm of bladder, unspecified: Secondary | ICD-10-CM

## 2015-04-21 DIAGNOSIS — N3289 Other specified disorders of bladder: Secondary | ICD-10-CM | POA: Diagnosis not present

## 2015-04-21 LAB — MICROSCOPIC EXAMINATION

## 2015-04-21 LAB — URINALYSIS, COMPLETE
Bilirubin, UA: NEGATIVE
Glucose, UA: NEGATIVE
KETONES UA: NEGATIVE
Nitrite, UA: NEGATIVE
PH UA: 6.5 (ref 5.0–7.5)
Protein, UA: NEGATIVE
RBC UA: NEGATIVE
Specific Gravity, UA: <1.005 — ABNORMAL LOW (ref 1.005–1.030)
Urobilinogen, Ur: 0.2 mg/dL (ref 0.2–1.0)

## 2015-04-21 MED ORDER — CIPROFLOXACIN HCL 500 MG PO TABS
500.0000 mg | ORAL_TABLET | Freq: Once | ORAL | Status: AC
  Administered 2015-04-21: 500 mg via ORAL

## 2015-04-21 MED ORDER — LIDOCAINE HCL 2 % EX GEL
1.0000 "application " | Freq: Once | CUTANEOUS | Status: AC
  Administered 2015-04-21: 1 via URETHRAL

## 2015-04-21 NOTE — Progress Notes (Signed)
    Cystoscopy Procedure Note  Patient identification was confirmed, informed consent was obtained, and patient was prepped using Betadine solution.  Lidocaine jelly was administered per urethral meatus.    Preoperative abx where received prior to procedure.    Procedure: - Flexible cystoscope introduced, without any difficulty.   - Thorough search of the bladder revealed:    normal urethral meatus    normal urothelium    no stones    no ulcers     no tumors    no urethral polyps    no trabeculation  - Ureteral orifices were normal in position and appearance.  Post-Procedure: - Patient tolerated the procedure well   

## 2015-04-21 NOTE — Progress Notes (Signed)
Patient here for routine follow-up cystoscopy every 3 months after recent recurrence of from bladder tumor and BCG treatment. Both she and her brother has exact same tumor although his is a little more aggressive at this point then [facility]. Interesting to note the have the tumor at same relative timeframe with the same treatment and diagnosis. The cystoscopy result is to follow and follow-up will be in 3 months for another cystoscopy.

## 2015-05-19 ENCOUNTER — Other Ambulatory Visit: Payer: Self-pay | Admitting: Urology

## 2015-06-14 ENCOUNTER — Other Ambulatory Visit: Payer: Self-pay | Admitting: Urology

## 2015-07-23 ENCOUNTER — Encounter: Payer: Self-pay | Admitting: Urology

## 2015-07-23 ENCOUNTER — Ambulatory Visit (INDEPENDENT_AMBULATORY_CARE_PROVIDER_SITE_OTHER): Payer: Medicare Other | Admitting: Urology

## 2015-07-23 VITALS — BP 166/82 | HR 103 | Ht 65.0 in | Wt 153.6 lb

## 2015-07-23 DIAGNOSIS — N3281 Overactive bladder: Secondary | ICD-10-CM

## 2015-07-23 DIAGNOSIS — N3289 Other specified disorders of bladder: Secondary | ICD-10-CM

## 2015-07-23 DIAGNOSIS — C679 Malignant neoplasm of bladder, unspecified: Secondary | ICD-10-CM | POA: Diagnosis not present

## 2015-07-23 LAB — URINALYSIS, COMPLETE
BILIRUBIN UA: NEGATIVE
Glucose, UA: NEGATIVE
Ketones, UA: NEGATIVE
Nitrite, UA: NEGATIVE
Protein, UA: NEGATIVE
SPEC GRAV UA: 1.015 (ref 1.005–1.030)
Urobilinogen, Ur: 0.2 mg/dL (ref 0.2–1.0)
pH, UA: 5.5 (ref 5.0–7.5)

## 2015-07-23 LAB — MICROSCOPIC EXAMINATION
Epithelial Cells (non renal): >10 /hpf — AB (ref 0–10)
Renal Epithel, UA: NONE SEEN /hpf
WBC UA: NONE SEEN /HPF (ref 0–?)

## 2015-07-23 MED ORDER — SOLIFENACIN SUCCINATE 10 MG PO TABS: 10.0000 mg | ORAL_TABLET | Freq: Every day | ORAL | Status: DC

## 2015-07-23 MED ORDER — CIPROFLOXACIN HCL 500 MG PO TABS
500.0000 mg | ORAL_TABLET | Freq: Once | ORAL | Status: AC
  Administered 2015-07-23: 500 mg via ORAL

## 2015-07-23 MED ORDER — LIDOCAINE HCL 2 % EX GEL
1.0000 "application " | Freq: Once | CUTANEOUS | Status: AC
  Administered 2015-07-23: 1 via URETHRAL

## 2015-07-23 NOTE — Progress Notes (Signed)
    Cystoscopy Procedure Note  Patient identification was confirmed, informed consent was obtained, and patient was prepped using Betadine solution.  Lidocaine jelly was administered per urethral meatus.    Preoperative abx where received prior to procedure.    Procedure: - Flexible cystoscope introduced, without any difficulty.   - Thorough search of the bladder revealed:    normal urethral meatus    normal urothelium    no stones    no ulcers     no tumors    no urethral polyps    no trabeculation  - Ureteral orifices were normal in position and appearance.  Post-Procedure: - Patient tolerated the procedure well   

## 2015-07-23 NOTE — Progress Notes (Signed)
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 Cystoscopy today revealed no tumors masses or growths and report is to follow patient also has overactive bladder and will be maintaining her on her Vesicare. Follow-up is in 3 months for her one-year anniversary of cystoscopy. Incidentally both her brothers have the same type tumor and a being treated exactly the same and hasn't had exactly the same course as she had.. Most interesting family case.

## 2015-10-22 ENCOUNTER — Other Ambulatory Visit: Payer: Medicare Other | Admitting: Urology

## 2015-10-25 ENCOUNTER — Ambulatory Visit (INDEPENDENT_AMBULATORY_CARE_PROVIDER_SITE_OTHER): Payer: Medicare Other | Admitting: Urology

## 2015-10-25 ENCOUNTER — Encounter: Payer: Self-pay | Admitting: Urology

## 2015-10-25 VITALS — Ht 65.0 in | Wt 155.2 lb

## 2015-10-25 DIAGNOSIS — Z8603 Personal history of neoplasm of uncertain behavior: Secondary | ICD-10-CM

## 2015-10-25 DIAGNOSIS — Z87448 Personal history of other diseases of urinary system: Secondary | ICD-10-CM

## 2015-10-25 LAB — URINALYSIS, COMPLETE
Bilirubin, UA: NEGATIVE
Glucose, UA: NEGATIVE
KETONES UA: NEGATIVE
LEUKOCYTES UA: NEGATIVE
NITRITE UA: NEGATIVE
PH UA: 5.5 (ref 5.0–7.5)
Protein, UA: NEGATIVE
RBC, UA: NEGATIVE
Urobilinogen, Ur: 0.2 mg/dL (ref 0.2–1.0)

## 2015-10-25 LAB — MICROSCOPIC EXAMINATION
RBC MICROSCOPIC, UA: NONE SEEN /HPF (ref 0–?)
WBC, UA: NONE SEEN /hpf (ref 0–?)

## 2015-10-25 MED ORDER — CIPROFLOXACIN HCL 500 MG PO TABS
500.0000 mg | ORAL_TABLET | Freq: Once | ORAL | Status: AC
  Administered 2015-10-25: 500 mg via ORAL

## 2015-10-25 MED ORDER — LIDOCAINE HCL 2 % EX GEL
1.0000 "application " | Freq: Once | CUTANEOUS | Status: AC
  Administered 2015-10-25: 1 via URETHRAL

## 2015-10-25 NOTE — Progress Notes (Signed)
10/25/2015 8:19 PM   Adriana Anderson [birth year] [id number]  Referring provider: Madelyn Brunner, MD [place] [facility] [place], [place] [address]  Chief Complaint  Patient presents with  . Cysto    6month    HPI: 75 year old female who returns today for surveillance cystoscopy.   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  She returned to the OR on 12/15 for repeat TURBT which was negative.  Last cystoscopy on 07/23/2015 was negative for any recurrence.  She denies any gross hematuria or dysuria.  She does have a strong family history of bladder cancer (siblings).   PMH: Past Medical History  Diagnosis Date  . Bladder mass   . Acid reflux   . Bladder cancer ([place])   . GERD (gastroesophageal reflux disease)   . Liver disease   . Diabetes mellitus, type 2 [facility])     Surgical History: Past Surgical History  Procedure Laterality Date  . Appendectomy    . Toe surgery      Home Medications:    Medication List       This list is accurate as of: 10/25/15  8:19 PM.  Always use your most recent med list.               CALTRATE 600+D 600-400 MG-UNIT chew tablet  Generic drug:  Calcium Carbonate-Vitamin D  Chew by mouth.     DEXILANT 60 MG capsule  Generic drug:  dexlansoprazole     ESTRACE VAGINAL 0.1 MG/GM vaginal cream  Generic drug:  estradiol  USE 1 APPLICATION VAGINALLY ONCE A WEEK     solifenacin 10 MG tablet  Commonly known as:  VESICARE  Take 1 tablet (10 mg total) by mouth daily.        Allergies:  Allergies  Allergen Reactions  . Tetanus Toxoids Swelling  . Tetanus-Diphth-Acell Pertussis Swelling  . Sulfa Antibiotics Rash    Family History: Family History  Problem Relation Age of Onset  . Bladder Cancer Brother     x 3   . Brain cancer Father   . Diabetes      Social History:  reports that she quit smoking about 33 years ago. She does not have any smokeless tobacco history on file. She  reports that she drinks alcohol. She reports that she does not use illicit drugs.   Physical Exam: Ht 5\' 5"  (1.651 m)  Wt 155 lb 3.2 oz (70.398 kg)  BMI 25.83 kg/m2  Constitutional:  Alert and oriented, No acute distress. HEENT: [place] AT, moist mucus membranes.  Trachea midline, no masses. Cardiovascular: No clubbing, cyanosis, or edema.  RRR. Respiratory: Normal respiratory effort, no increased work of breathing.  CTAB. GI: Abdomen is soft, nontender, nondistended, no abdominal masses GU: No CVA tenderness. Normal external genitalia.  Urethral meatus normal. Skin: No rashes, bruises or suspicious lesions. Neurologic: Grossly intact, no focal deficits, moving all 4 extremities. Psychiatric: Normal mood and affect.  Laboratory Data: Lab Results  Component Value Date   WBC 5.7 06/08/2012   HGB 11.8* 06/08/2012   HCT 32.6* 06/08/2012   MCV 95 06/08/2012   PLT 158 06/08/2012    Lab Results  Component Value Date   CREATININE 0.99 08/20/2014    Urinalysis Results for orders placed or performed in visit on 10/25/15  Microscopic Examination  Result Value Ref Range   WBC, UA None seen 0 -  5 /hpf   RBC, UA None seen  0 -  2 /hpf   Epithelial Cells (non renal) 0-10 0 - 10 /hpf   Bacteria, UA Few (A) None seen/Few  Urinalysis, Complete  Result Value Ref Range   Specific Gravity, UA <1.005 (L) 1.005 - 1.030   pH, UA 5.5 5.0 - 7.5   Color, UA Yellow Yellow   Appearance Ur Clear Clear   Leukocytes, UA Negative Negative   Protein, UA Negative Negative/Trace   Glucose, UA Negative Negative   Ketones, UA Negative Negative   RBC, UA Negative Negative   Bilirubin, UA Negative Negative   Urobilinogen, Ur 0.2 0.2 - 1.0 mg/dL   Nitrite, UA Negative Negative   Microscopic Examination See below:     Pertinent Imaging: N/a  Cystoscopy Procedure Note  Patient identification was confirmed, informed consent was obtained, and patient was prepped using Betadine solution.  Lidocaine jelly  was administered per urethral meatus.    Preoperative abx where received prior to procedure.    Procedure: - Flexible cystoscope introduced, without any difficulty.   - Thorough search of the bladder revealed:    normal urethral meatus    normal urothelium other than small, slightly raised with fine papillary appearnace, erythematous mucosal lesion on right posterior wall, approximately 0.5 cm which is suspicious for low grade early recurrence    no stones    no ulcers     no tumors    no urethral polyps    no trabeculation  - Ureteral orifices were visualized in position, right UA appears to have been previously resected.  Left UO normal.  Post-Procedure: - Patient tolerated the procedure well  Assessment & Plan:    1. History of neoplasm of bladder Cystoscopy today with concern for recurrence of tumor, very small. She was offered the option today of continuing to follow this small lesion in 3 months to assess for growth versus proceed to operating room for repeat TURBT/mitomycin.  Risk and benefits of TURBT were discussed in detail. She is undergone the procedure before and understands these.   - Urinalysis, Complete - ciprofloxacin (CIPRO) tablet 500 mg; Take 1 tablet (500 mg total) by mouth once. - lidocaine (XYLOCAINE) 2 % jelly 1 application; Place 1 application into the urethra once. - CULTURE, URINE COMPREHENSIVE   Return for schedule bladder biopsy.  [name], MD  [facility] [address] [place], [place] [address] [phone]

## 2015-10-26 ENCOUNTER — Telehealth: Payer: Self-pay | Admitting: Radiology

## 2015-10-26 NOTE — Telephone Encounter (Signed)
Notified pt of surgery scheduled 2/13, pre-admit testing appt on 2/2 @9 :45 and to call Friday prior to surgery for arrival time to SDS. Pt voices understanding.

## 2015-10-27 LAB — CULTURE, URINE COMPREHENSIVE

## 2015-11-04 ENCOUNTER — Encounter
Admission: RE | Admit: 2015-11-04 | Discharge: 2015-11-04 | Disposition: A | Discharge status: Home / Self Care | Payer: Medicare Other | Source: Ambulatory Visit | Attending: Urology | Admitting: Urology

## 2015-11-04 DIAGNOSIS — Z01812 Encounter for preprocedural laboratory examination: Secondary | ICD-10-CM | POA: Insufficient documentation

## 2015-11-04 HISTORY — DX: Other specified postprocedural states: Z98.890

## 2015-11-04 HISTORY — DX: Other specified postprocedural states: R11.2

## 2015-11-04 LAB — BASIC METABOLIC PANEL
Anion gap: 7 (ref 5–15)
BUN: 13 mg/dL (ref 6–20)
CO2: 26 mmol/L (ref 22–32)
Calcium: 9.2 mg/dL (ref 8.9–10.3)
Chloride: 103 mmol/L (ref 101–111)
Creatinine, Ser: 0.96 mg/dL (ref 0.44–1.00)
GFR calc Af Amer: >60 mL/min (ref >60)
GFR, EST NON AFRICAN AMERICAN: 57 mL/min — AB (ref >60)
Glucose, Bld: 96 mg/dL (ref 65–99)
POTASSIUM: 4.5 mmol/L (ref 3.5–5.1)
SODIUM: 136 mmol/L (ref 135–145)

## 2015-11-04 NOTE — Pre-Procedure Instructions (Addendum)
Progress note found under Encounters 10/25/15 titled Procedure Visit

## 2015-11-04 NOTE — Patient Instructions (Signed)
  Your procedure is scheduled on: Monday 11/15/15 Report to Day Surgery. 2ND FLOOR MEDICAL MALL ENTRANCE To find out your arrival time please call [phone] between 1PM - 3PM on Friday 11/12/15.  Remember: Instructions that are not followed completely may result in serious medical risk, up to and including death, or upon the discretion of your surgeon and anesthesiologist your surgery may need to be rescheduled.    __X__ 1. Do not eat food or drink liquids after midnight. No gum chewing or hard candies.     __X__ 2. No Alcohol for 24 hours before or after surgery.   ____ 3. Bring all medications with you on the day of surgery if instructed.    __X__ 4. Notify your doctor if there is any change in your medical condition     (cold, fever, infections).     Do not wear jewelry, make-up, hairpins, clips or nail polish.  Do not wear lotions, powders, or perfumes. You may wear deodorant.  Do not shave 48 hours prior to surgery. Men may shave face and neck.  Do not bring valuables to the hospital.    [facility] is not responsible for any belongings or valuables.               Contacts, dentures or bridgework may not be worn into surgery.  Leave your suitcase in the car. After surgery it may be brought to your room.  For patients admitted to the hospital, discharge time is determined by your                treatment team.   Patients discharged the day of surgery will not be allowed to drive home.   Please read over the following fact sheets that you were given:   Surgical Site Infection Prevention   __X__ Take these medicines the morning of surgery with A SIP OF WATER:    1. TAKE EXTRA DOSE DEXILANT AT BEDTIME THE NIGHT BEFORE SURGERY AND AGAIN THE MORNING OF SURGERY  2.   3.   4.  5.  6.  ____ Fleet Enema (as directed)   ____ Use CHG Soap as directed  ____ Use inhalers on the day of surgery  ____ Stop metformin 2 days prior to surgery    ____ Take 1/2 of usual insulin dose  the night before surgery and none on the morning of surgery.   ____ Stop Coumadin/Plavix/aspirin on   ____ Stop Anti-inflammatories on NO ADVIL IBUPROFEN OR ALEVE FOR 7 DAYS BEFORE SURGERY TYLENOL ONLY FOR ACHES OR PAINS   ____ Stop supplements until after surgery.    ____ Bring C-Pap to the hospital.

## 2015-11-15 ENCOUNTER — Ambulatory Visit
Admission: RE | Admit: 2015-11-15 | Discharge: 2015-11-15 | Disposition: A | Discharge status: Home / Self Care | Payer: Medicare Other | Source: Ambulatory Visit | Attending: Urology | Admitting: Urology

## 2015-11-15 ENCOUNTER — Ambulatory Visit: Payer: Medicare Other | Admitting: Registered Nurse

## 2015-11-15 ENCOUNTER — Encounter
Admission: RE | Disposition: A | Discharge status: Home / Self Care | Payer: Self-pay | Source: Ambulatory Visit | Attending: Urology

## 2015-11-15 ENCOUNTER — Encounter: Payer: Self-pay | Admitting: *Deleted

## 2015-11-15 DIAGNOSIS — D494 Neoplasm of unspecified behavior of bladder: Secondary | ICD-10-CM | POA: Diagnosis not present

## 2015-11-15 DIAGNOSIS — E119 Type 2 diabetes mellitus without complications: Secondary | ICD-10-CM | POA: Diagnosis not present

## 2015-11-15 DIAGNOSIS — Z87891 Personal history of nicotine dependence: Secondary | ICD-10-CM | POA: Insufficient documentation

## 2015-11-15 DIAGNOSIS — Z808 Family history of malignant neoplasm of other organs or systems: Secondary | ICD-10-CM | POA: Diagnosis not present

## 2015-11-15 DIAGNOSIS — K219 Gastro-esophageal reflux disease without esophagitis: Secondary | ICD-10-CM | POA: Insufficient documentation

## 2015-11-15 DIAGNOSIS — Z887 Allergy status to serum and vaccine status: Secondary | ICD-10-CM | POA: Insufficient documentation

## 2015-11-15 DIAGNOSIS — Z9049 Acquired absence of other specified parts of digestive tract: Secondary | ICD-10-CM | POA: Insufficient documentation

## 2015-11-15 DIAGNOSIS — D090 Carcinoma in situ of bladder: Secondary | ICD-10-CM | POA: Insufficient documentation

## 2015-11-15 DIAGNOSIS — Z882 Allergy status to sulfonamides status: Secondary | ICD-10-CM | POA: Insufficient documentation

## 2015-11-15 DIAGNOSIS — Z79899 Other long term (current) drug therapy: Secondary | ICD-10-CM | POA: Diagnosis not present

## 2015-11-15 DIAGNOSIS — Z8551 Personal history of malignant neoplasm of bladder: Secondary | ICD-10-CM | POA: Insufficient documentation

## 2015-11-15 DIAGNOSIS — Z8052 Family history of malignant neoplasm of bladder: Secondary | ICD-10-CM | POA: Insufficient documentation

## 2015-11-15 DIAGNOSIS — Z9889 Other specified postprocedural states: Secondary | ICD-10-CM | POA: Diagnosis not present

## 2015-11-15 DIAGNOSIS — K769 Liver disease, unspecified: Secondary | ICD-10-CM | POA: Insufficient documentation

## 2015-11-15 DIAGNOSIS — Z833 Family history of diabetes mellitus: Secondary | ICD-10-CM | POA: Diagnosis not present

## 2015-11-15 DIAGNOSIS — N329 Bladder disorder, unspecified: Secondary | ICD-10-CM | POA: Diagnosis present

## 2015-11-15 DIAGNOSIS — Z8603 Personal history of neoplasm of uncertain behavior: Secondary | ICD-10-CM

## 2015-11-15 HISTORY — PX: CYSTOSCOPY WITH BIOPSY: SHX5122

## 2015-11-15 LAB — GLUCOSE, CAPILLARY: GLUCOSE-CAPILLARY: 104 mg/dL — AB (ref 65–99)

## 2015-11-15 SURGERY — CYSTOSCOPY, WITH BIOPSY
Anesthesia: General

## 2015-11-15 MED ORDER — HYDROCODONE-ACETAMINOPHEN 5-325 MG PO TABS
ORAL_TABLET | ORAL | Status: AC
  Administered 2015-11-15: 1 via ORAL
  Filled 2015-11-15: qty 1

## 2015-11-15 MED ORDER — HYDROCODONE-ACETAMINOPHEN 5-325 MG PO TABS: 1.0000 | ORAL_TABLET | Freq: Four times a day (QID) | ORAL | Status: DC | PRN

## 2015-11-15 MED ORDER — SODIUM CHLORIDE 0.9 % IV SOLN
INTRAVENOUS | Status: DC
  Administered 2015-11-15: 13:00:00 via INTRAVENOUS

## 2015-11-15 MED ORDER — HYDROCODONE-ACETAMINOPHEN 5-325 MG PO TABS
1.0000 | ORAL_TABLET | ORAL | Status: DC | PRN
  Administered 2015-11-15: 1 via ORAL

## 2015-11-15 MED ORDER — OXYBUTYNIN CHLORIDE 5 MG PO TABS: 5.0000 mg | ORAL_TABLET | Freq: Three times a day (TID) | ORAL | Status: DC | PRN

## 2015-11-15 MED ORDER — FENTANYL CITRATE (PF) 100 MCG/2ML IJ SOLN
INTRAMUSCULAR | Status: DC | PRN
  Administered 2015-11-15: 50 ug via INTRAVENOUS

## 2015-11-15 MED ORDER — METOCLOPRAMIDE HCL 5 MG/ML IJ SOLN: 10.0000 mg | Freq: Once | INTRAMUSCULAR | Status: DC | PRN

## 2015-11-15 MED ORDER — PROPOFOL 10 MG/ML IV BOLUS
INTRAVENOUS | Status: DC | PRN
  Administered 2015-11-15: 150 mg via INTRAVENOUS

## 2015-11-15 MED ORDER — HYDROMORPHONE HCL 1 MG/ML IJ SOLN: 0.2500 mg | INTRAMUSCULAR | Status: DC | PRN

## 2015-11-15 MED ORDER — CEFAZOLIN SODIUM-DEXTROSE 2-3 GM-% IV SOLR
INTRAVENOUS | Status: AC
  Filled 2015-11-15: qty 50

## 2015-11-15 MED ORDER — ONDANSETRON HCL 4 MG/2ML IJ SOLN
INTRAMUSCULAR | Status: DC | PRN
  Administered 2015-11-15: 4 mg via INTRAVENOUS

## 2015-11-15 MED ORDER — CEFAZOLIN SODIUM-DEXTROSE 2-3 GM-% IV SOLR: 2.0000 g | Freq: Once | INTRAVENOUS | Status: DC

## 2015-11-15 MED ORDER — MIDAZOLAM HCL 2 MG/2ML IJ SOLN
INTRAMUSCULAR | Status: DC | PRN
  Administered 2015-11-15: 2 mg via INTRAVENOUS

## 2015-11-15 MED ORDER — LIDOCAINE HCL (CARDIAC) 20 MG/ML IV SOLN
INTRAVENOUS | Status: DC | PRN
  Administered 2015-11-15: 60 mg via INTRAVENOUS

## 2015-11-15 MED ORDER — GLYCOPYRROLATE 0.2 MG/ML IJ SOLN
INTRAMUSCULAR | Status: DC | PRN
  Administered 2015-11-15: 0.2 mg via INTRAVENOUS

## 2015-11-15 MED ORDER — DEXAMETHASONE SODIUM PHOSPHATE 10 MG/ML IJ SOLN
INTRAMUSCULAR | Status: DC | PRN
Start: 2015-11-15 — End: 2015-11-15
  Administered 2015-11-15: 5 mg via INTRAVENOUS

## 2015-11-15 SURGICAL SUPPLY — 16 items
BAG DRAIN CYSTO-URO LG1000N (MISCELLANEOUS) ×3 IMPLANT
CORD URO TURP 10FT (MISCELLANEOUS) ×3 IMPLANT
ELECT REM PT RETURN 9FT ADLT (ELECTROSURGICAL) ×3
ELECTRODE REM PT RTRN 9FT ADLT (ELECTROSURGICAL) ×1 IMPLANT
GLOVE BIO SURGEON STRL SZ 6.5 (GLOVE) ×2 IMPLANT
GLOVE BIO SURGEON STRL SZ7 (GLOVE) ×6 IMPLANT
GLOVE BIO SURGEONS STRL SZ 6.5 (GLOVE) ×1
GOWN STRL REUS W/ TWL LRG LVL3 (GOWN DISPOSABLE) ×2 IMPLANT
GOWN STRL REUS W/TWL LRG LVL3 (GOWN DISPOSABLE) ×4
KIT RM TURNOVER CYSTO AR (KITS) ×3 IMPLANT
PACK CYSTO AR (MISCELLANEOUS) ×3 IMPLANT
PREP PVP WINGED SPONGE (MISCELLANEOUS) ×3 IMPLANT
SET CYSTO W/LG BORE CLAMP LF (SET/KITS/TRAYS/PACK) ×3 IMPLANT
SURGILUBE 2OZ TUBE FLIPTOP (MISCELLANEOUS) ×3 IMPLANT
WATER STERILE IRR 1000ML POUR (IV SOLUTION) ×3 IMPLANT
WATER STERILE IRR 3000ML UROMA (IV SOLUTION) ×3 IMPLANT

## 2015-11-15 NOTE — Anesthesia Preprocedure Evaluation (Signed)
Anesthesia Evaluation  Patient identified by MRN, date of birth, ID band  Reviewed: Allergy & Precautions, NPO status , Patient's Chart, lab work & pertinent test results  History of Anesthesia Complications (+) PONV  Airway Mallampati: II  TM Distance: >3 FB Neck ROM: Limited    Dental  (+) Teeth Intact Upper front left implants, and permanent cemented bridge on right.:   Pulmonary former smoker,    Pulmonary exam normal        Cardiovascular Exercise Tolerance: Good negative cardio ROS Normal cardiovascular exam     Neuro/Psych    GI/Hepatic   Endo/Other  diabetes, Type [id number].  Renal/GU      Musculoskeletal   Abdominal (+)  Abdomen: soft.    Peds  Hematology   Anesthesia Other Findings   Reproductive/Obstetrics                             Anesthesia Physical Anesthesia Plan  ASA: III  Anesthesia Plan: General   Post-op Pain Management:    Induction: Intravenous  Airway Management Planned: LMA  Additional Equipment:   Intra-op Plan:   Post-operative Plan: Extubation in OR  Informed Consent: I have reviewed the patients History and Physical, chart, labs and discussed the procedure including the risks, benefits and alternatives for the proposed anesthesia with the patient or authorized representative who has indicated his/her understanding and acceptance.     Plan Discussed with: CRNA  Anesthesia Plan Comments:         Anesthesia Quick Evaluation

## 2015-11-15 NOTE — Interval H&P Note (Signed)
History and Physical Interval Note:  11/15/2015 1:34 PM  Adriana Anderson  has presented today for surgery, with the diagnosis of BLADDER LESION, HISTORY OF BLADDER NEOPLASM  The various methods of treatment have been discussed with the patient and family. After consideration of risks, benefits and other options for treatment, the patient has consented to  Procedure(s): CYSTOSCOPY WITH BIOPSY (N/A) as a surgical intervention .  The patient's history has been reviewed, patient examined, no change in status, stable for surgery.  I have reviewed the patient's chart and labs.  Questions were answered to the patient's satisfaction.     [name]

## 2015-11-15 NOTE — Anesthesia Postprocedure Evaluation (Signed)
Anesthesia Post Note  Patient: Adriana Anderson  Procedure(s) Performed: Procedure(s) (LRB): CYSTOSCOPY WITH BIOPSY (N/A)  Patient location during evaluation: PACU Anesthesia Type: General Level of consciousness: awake and alert Pain management: pain level controlled Vital Signs Assessment: post-procedure vital signs reviewed and stable Respiratory status: spontaneous breathing Cardiovascular status: blood pressure returned to baseline Postop Assessment: no headache Anesthetic complications: no    Last Vitals:  Filed Vitals:   11/15/15 1454 11/15/15 1505  BP: 175/84 170/81  Pulse: 94 92  Temp:  36.8 C  Resp: 12 12    Last Pain:  Filed Vitals:   11/15/15 1506  PainSc: 0-No pain                 Deforest Maiden M

## 2015-11-15 NOTE — Op Note (Signed)
Date of procedure: 11/15/2015  Preoperative diagnosis:  1. History of bladder cancer 2.  Bladder lesion  Postoperative diagnosis:  1. Same as above   Procedure: 1. Cystoscopy 2. 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 patient with history of high-grade T1 TCC status post BCG in April 2015. On surveillance cystoscopy, she was noted to have somewhat suspicious area on her posterior bladder wall which she was counseled to undergo biopsy of this lesion..  After reviewing the management options for treatment, she elected to proceed with the above surgical procedure(s). We have discussed the potential benefits and risks of the procedure, side effects of the proposed treatment, the likelihood of the patient achieving the goals of the procedure, and any potential problems that might occur during the procedure or recuperation. Informed consent has been obtained.  Description of procedure:  The patient was taken to the operating room and general anesthesia was induced.  The patient was placed in the dorsal lithotomy position, prepped and draped in the usual sterile fashion, and preoperative antibiotics were administered. A preoperative time-out was performed.   At this point in time, a rigid 21 French cystoscope was advanced per urethra into the bladder. Careful inspection of the bladder was then undertaken. No obvious papillary bladder tumors were identified throughout the entire bladder. The left ureteral orifice was normal and the right ureteral orifice appeared to be duplicated and previously resected with a stellate scar in the overlying area. There  is also a posterior wall stellate scar and adjacent to this, there was approximately a 0.5 cm to 1 cm area of patchy erythema which was quite distinct from the remainder of the bladder. 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 Cold cup biopsy forceps were used then to biopsy this lesion. This was passed off the field as bladder biopsy. Muscularis propria could be seen at the resection bed. Bugbee electrocautery was then used to fulgurate the base of this bladder and the surrounding areas until there was no residual abnormal mucosa remaining.  At this point, the bladder was then drained. There was no active bleeding noted. The patient was then cleaned and dried, reversed from anesthesia, and taken to the PACU in stable condition.  Plan: Patient will follow up in 1 week to discuss her pathology results.  [name], M.D.

## 2015-11-15 NOTE — Transfer of Care (Signed)
Immediate Anesthesia Transfer of Care Note  Patient: Adriana Anderson  Procedure(s) Performed: Procedure(s): CYSTOSCOPY WITH BIOPSY (N/A)  Patient Location: PACU  Anesthesia Type:General  Level of Consciousness: sedated  Airway & Oxygen Therapy: Patient Spontanous Breathing and Patient connected to face mask oxygen  Post-op Assessment: Report given to RN and Post -op Vital signs reviewed and stable  Post vital signs: Reviewed and stable  Last Vitals:  Filed Vitals:   11/15/15 1242 11/15/15 1419  BP: 188/75 152/75  Pulse: 94 110  Temp: 36.7 C 37 C  Resp: 18 16    Complications: No apparent anesthesia complications

## 2015-11-15 NOTE — H&P (View-Only) (Signed)
10/25/2015 8:19 PM   Linus Salmons [birth year] [id number]  Referring provider: Madelyn Brunner, MD [place] [facility] [place], [place] [address]  Chief Complaint  Patient presents with  . Cysto    39month    HPI: 75 year old female who returns today for surveillance cystoscopy.   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  She returned to the OR on 12/15 for repeat TURBT which was negative.  Last cystoscopy on 07/23/2015 was negative for any recurrence.  She denies any gross hematuria or dysuria.  She does have a strong family history of bladder cancer (siblings).   PMH: Past Medical History  Diagnosis Date  . Bladder mass   . Acid reflux   . Bladder cancer ([place])   . GERD (gastroesophageal reflux disease)   . Liver disease   . Diabetes mellitus, type 2 [facility])     Surgical History: Past Surgical History  Procedure Laterality Date  . Appendectomy    . Toe surgery      Home Medications:    Medication List       This list is accurate as of: 10/25/15  8:19 PM.  Always use your most recent med list.               CALTRATE 600+D 600-400 MG-UNIT chew tablet  Generic drug:  Calcium Carbonate-Vitamin D  Chew by mouth.     DEXILANT 60 MG capsule  Generic drug:  dexlansoprazole     ESTRACE VAGINAL 0.1 MG/GM vaginal cream  Generic drug:  estradiol  USE 1 APPLICATION VAGINALLY ONCE A WEEK     solifenacin 10 MG tablet  Commonly known as:  VESICARE  Take 1 tablet (10 mg total) by mouth daily.        Allergies:  Allergies  Allergen Reactions  . Tetanus Toxoids Swelling  . Tetanus-Diphth-Acell Pertussis Swelling  . Sulfa Antibiotics Rash    Family History: Family History  Problem Relation Age of Onset  . Bladder Cancer Brother     x 3   . Brain cancer Father   . Diabetes      Social History:  reports that she quit smoking about 33 years ago. She does not have any smokeless tobacco history on file. She  reports that she drinks alcohol. She reports that she does not use illicit drugs.   Physical Exam: Ht 5\' 5"  (1.651 m)  Wt 155 lb 3.2 oz (70.398 kg)  BMI 25.83 kg/m2  Constitutional:  Alert and oriented, No acute distress. HEENT: [place] AT, moist mucus membranes.  Trachea midline, no masses. Cardiovascular: No clubbing, cyanosis, or edema.  RRR. Respiratory: Normal respiratory effort, no increased work of breathing.  CTAB. GI: Abdomen is soft, nontender, nondistended, no abdominal masses GU: No CVA tenderness. Normal external genitalia.  Urethral meatus normal. Skin: No rashes, bruises or suspicious lesions. Neurologic: Grossly intact, no focal deficits, moving all 4 extremities. Psychiatric: Normal mood and affect.  Laboratory Data: Lab Results  Component Value Date   WBC 5.7 06/08/2012   HGB 11.8* 06/08/2012   HCT 32.6* 06/08/2012   MCV 95 06/08/2012   PLT 158 06/08/2012    Lab Results  Component Value Date   CREATININE 0.99 08/20/2014    Urinalysis Results for orders placed or performed in visit on 10/25/15  Microscopic Examination  Result Value Ref Range   WBC, UA None seen 0 -  5 /hpf   RBC, UA None seen  0 -  2 /hpf   Epithelial Cells (non renal) 0-10 0 - 10 /hpf   Bacteria, UA Few (A) None seen/Few  Urinalysis, Complete  Result Value Ref Range   Specific Gravity, UA <1.005 (L) 1.005 - 1.030   pH, UA 5.5 5.0 - 7.5   Color, UA Yellow Yellow   Appearance Ur Clear Clear   Leukocytes, UA Negative Negative   Protein, UA Negative Negative/Trace   Glucose, UA Negative Negative   Ketones, UA Negative Negative   RBC, UA Negative Negative   Bilirubin, UA Negative Negative   Urobilinogen, Ur 0.2 0.2 - 1.0 mg/dL   Nitrite, UA Negative Negative   Microscopic Examination See below:     Pertinent Imaging: N/a  Cystoscopy Procedure Note  Patient identification was confirmed, informed consent was obtained, and patient was prepped using Betadine solution.  Lidocaine jelly  was administered per urethral meatus.    Preoperative abx where received prior to procedure.    Procedure: - Flexible cystoscope introduced, without any difficulty.   - Thorough search of the bladder revealed:    normal urethral meatus    normal urothelium other than small, slightly raised with fine papillary appearnace, erythematous mucosal lesion on right posterior wall, approximately 0.5 cm which is suspicious for low grade early recurrence    no stones    no ulcers     no tumors    no urethral polyps    no trabeculation  - Ureteral orifices were visualized in position, right UA appears to have been previously resected.  Left UO normal.  Post-Procedure: - Patient tolerated the procedure well  Assessment & Plan:    1. History of neoplasm of bladder Cystoscopy today with concern for recurrence of tumor, very small. She was offered the option today of continuing to follow this small lesion in 3 months to assess for growth versus proceed to operating room for repeat TURBT/mitomycin.  Risk and benefits of TURBT were discussed in detail. She is undergone the procedure before and understands these.   - Urinalysis, Complete - ciprofloxacin (CIPRO) tablet 500 mg; Take 1 tablet (500 mg total) by mouth once. - lidocaine (XYLOCAINE) 2 % jelly 1 application; Place 1 application into the urethra once. - CULTURE, URINE COMPREHENSIVE   Return for schedule bladder biopsy.  [name], MD  [facility] [address], [place], [place] [address] [phone]

## 2015-11-15 NOTE — Discharge Instructions (Signed)
Transurethral Resection of Bladder Tumor (TURBT) or Bladder Biopsy ° ° °Definition: ° Transurethral Resection of the Bladder Tumor is a surgical procedure used to diagnose and remove tumors within the bladder. TURBT is the most common treatment for early stage bladder cancer. ° °General instructions: °   ° Your recent bladder surgery requires very little post hospital care but some definite precautions. ° °Despite the fact that no skin incisions were used, the area around the bladder incisions are raw and covered with scabs to promote healing and prevent bleeding. Certain precautions are needed to insure that the scabs are not disturbed over the next 2-4 weeks while the healing proceeds. ° °Because the raw surface inside your bladder and the irritating effects of urine you may expect frequency of urination and/or urgency (a stronger desire to urinate) and perhaps even getting up at night more often. This will usually resolve or improve slowly over the healing period. You may see some blood in your urine over the first 6 weeks. Do not be alarmed, even if the urine was clear for a while. Get off your feet and drink lots of fluids until clearing occurs. If you start to pass clots or don't improve call us. ° °Diet: ° °You may return to your normal diet immediately. Because of the raw surface of your bladder, alcohol, spicy foods, foods high in acid and drinks with caffeine may cause irritation or frequency and should be used in moderation. To keep your urine flowing freely and avoid constipation, drink plenty of fluids during the day (8-10 glasses). Tip: Avoid cranberry juice because it is very acidic. ° °Activity: ° °Your physical activity doesn't need to be restricted. However, if you are very active, you may see some blood in the urine. We suggest that you reduce your activity under the circumstances until the bleeding has stopped. ° °Bowels: ° °It is important to keep your bowels regular during the postoperative  period. Straining with bowel movements can cause bleeding. A bowel movement every other day is reasonable. Use a mild laxative if needed, such as milk of magnesia 2-3 tablespoons, or 2 Dulcolax tablets. Call if you continue to have problems. If you had been taking narcotics for pain, before, during or after your surgery, you may be constipated. Take a laxative if necessary. ° ° ° °Medication: ° °You should resume your pre-surgery medications unless told not to. In addition you may be given an antibiotic to prevent or treat infection. Antibiotics are not always necessary. All medication should be taken as prescribed until the bottles are finished unless you are having an unusual reaction to one of the drugs. ° ° °[facility] °[address], [address] °, [place] [address] °[phone] ° ° °AMBULATORY SURGERY  °DISCHARGE INSTRUCTIONS ° ° °1) The drugs that you were given will stay in your system until tomorrow so for the next 24 hours you should not: ° °A) Drive an automobile °B) Make any legal decisions °C) Drink any alcoholic beverage ° ° °2) You may resume regular meals tomorrow.  Today it is better to start with liquids and gradually work up to solid foods. ° °You may eat anything you prefer, but it is better to start with liquids, then soup and crackers, and gradually work up to solid foods. ° ° °3) Please notify your doctor immediately if you have any unusual bleeding, trouble breathing, redness and pain at the surgery site, drainage, fever, or pain not relieved by medication. ° ° ° °4)   Additional Instructions: ° ° ° ° ° ° ° °Please contact your physician with any problems or Same Day Surgery at [phone], Monday through Friday 6 am to 4 pm, or [place] [place] Main number at [phone]. ° ° ° ° °

## 2015-11-15 NOTE — Anesthesia Procedure Notes (Signed)
Procedure Name: LMA Insertion Date/Time: 11/15/2015 1:50 PM Performed by: Doreen Salvage Pre-anesthesia Checklist: Patient identified, Patient being monitored, Timeout performed, Emergency Drugs available and Suction available Patient Re-evaluated:Patient Re-evaluated prior to inductionOxygen Delivery Method: Circle system utilized Preoxygenation: Pre-oxygenation with 100% oxygen Intubation Type: IV induction Ventilation: Mask ventilation without difficulty LMA: LMA inserted LMA Size: 3.5 Tube type: Oral Number of attempts: 1 Placement Confirmation: positive ETCO2 and breath sounds checked- equal and bilateral Tube secured with: Tape Dental Injury: Teeth and Oropharynx as per pre-operative assessment

## 2015-11-16 ENCOUNTER — Encounter: Payer: Self-pay | Admitting: Urology

## 2015-11-18 LAB — SURGICAL PATHOLOGY

## 2015-11-23 ENCOUNTER — Ambulatory Visit (INDEPENDENT_AMBULATORY_CARE_PROVIDER_SITE_OTHER): Payer: Medicare Other | Admitting: Urology

## 2015-11-23 ENCOUNTER — Ambulatory Visit: Payer: Medicare Other | Admitting: Obstetrics and Gynecology

## 2015-11-23 ENCOUNTER — Encounter: Payer: Self-pay | Admitting: Urology

## 2015-11-23 VITALS — BP 158/79 | HR 103 | Ht 65.0 in | Wt 153.7 lb

## 2015-11-23 DIAGNOSIS — Z8551 Personal history of malignant neoplasm of bladder: Secondary | ICD-10-CM | POA: Diagnosis not present

## 2015-11-23 DIAGNOSIS — D090 Carcinoma in situ of bladder: Secondary | ICD-10-CM

## 2015-11-23 NOTE — Progress Notes (Signed)
5:21 PM  11/25/2015   Adriana Anderson [birth year] [id number]  Referring provider: Madelyn Brunner, MD [facility] [place], [place] [address]  Chief Complaint  Patient presents with  . 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 Unfortunately, pathology confirms this diagnosis.   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  CT Urogram negative.  She returned to the OR on 12/15 for repeat TURBT which was negative.  She does have a strong family history of bladder cancer (siblings).  Doing well postop. No dysuria or gross hematuria.  PMH: Past Medical History  Diagnosis Date  . Bladder mass   . Acid reflux   . Bladder cancer ([place])   . GERD (gastroesophageal reflux disease)   . Liver disease   . Diabetes mellitus, type 2 ([place])   . History of cystoscopy   . History of biopsy of bladder   . PONV (postoperative nausea and vomiting)     Surgical History: Past Surgical History  Procedure Laterality Date  . Appendectomy    . Toe surgery    . Cystoscopy with biopsy N/A 11/15/2015    Procedure: CYSTOSCOPY WITH BIOPSY;  Surgeon: [name], MD;  Location: [facility] ORS;  Service: Urology;  Laterality: N/A;    Home Medications:    Medication List       This list is accurate as of: 11/23/15 11:59 PM.  Always use your most recent med list.               CALTRATE 600+D 600-400 MG-UNIT chew tablet  Generic drug:  Calcium Carbonate-Vitamin D  Chew 1 tablet by mouth 2 (two) times daily.     CLARITIN PO  Take 1 tablet by mouth daily as needed.     DEXILANT 60 MG capsule  Generic drug:  dexlansoprazole  Take 60 mg by mouth daily.     ESTRACE VAGINAL 0.1 MG/GM vaginal cream  Generic drug:  estradiol  USE 1 APPLICATION VAGINALLY ONCE A WEEK     PROBIOTIC DAILY PO  Take 1 capsule by mouth daily.     solifenacin 10 MG tablet  Commonly known as:  VESICARE  Take 1 tablet (10 mg total) by mouth daily.        Allergies:  Allergies  Allergen Reactions  . Tetanus Toxoids Swelling  . Tetanus-Diphth-Acell Pertussis Swelling  . Sulfa Antibiotics Rash    Family History: Family History  Problem Relation Age of Onset  . Bladder Cancer Brother     x 3   . Brain cancer Father   . Diabetes      Social History:  reports that she quit smoking about 33 years ago. She does not have any smokeless tobacco history on file. She reports that she drinks alcohol. She reports that she does not use illicit drugs.   Physical Exam: BP 158/79 mmHg  Pulse 103  Ht 5\' 5"  (1.651 m)  Wt 153 lb 11.2 oz (69.718 kg)  BMI 25.58 kg/m2  Constitutional:  Alert and oriented, No acute distress. HEENT: [place] AT, moist mucus membranes.  Trachea midline, no masses. Cardiovascular: No clubbing, cyanosis, or edema.  Respiratory: Normal respiratory effort, no increased work of breathing.   Skin: No rashes, bruises or suspicious lesions. Neurologic: Grossly intact, no focal  deficits, moving all 4 extremities. Psychiatric: Normal mood and affect.  Laboratory Data: Lab Results  Component Value Date   WBC 5.7 06/08/2012   HGB 11.8* 06/08/2012   HCT 32.6* 06/08/2012   MCV 95 06/08/2012   PLT 158 06/08/2012    Lab Results  Component Value Date   CREATININE 0.96 11/04/2015    Urinalysis Results for orders placed or performed in visit on 11/23/15  Microscopic Examination  Result Value Ref Range   WBC, UA 0-5 0 -  5 /hpf   RBC, UA 0-2 0 -  2 /hpf   Epithelial Cells (non renal) 0-10 0 - 10 /hpf   Bacteria, UA None seen None seen/Few  Urinalysis, Complete  Result Value Ref Range   Specific Gravity, UA <1.005 (L) 1.005 - 1.030   pH, UA 5.5 5.0 - 7.5   Color, UA Yellow Yellow   Appearance Ur Clear Clear   Leukocytes, UA Negative Negative   Protein, UA  Negative Negative/Trace   Glucose, UA Negative Negative   Ketones, UA Negative Negative   RBC, UA 2+ (A) Negative   Bilirubin, UA Negative Negative   Urobilinogen, Ur 0.2 0.2 - 1.0 mg/dL   Nitrite, UA Negative Negative   Microscopic Examination See below:     Pertinent Imaging: N/a   Assessment & Plan:    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 I have recommended another course of induction BCG which he tolerated well and the past. I like to follow this up with maintenance BCG given her history of previous high-grade T1 disease and significant family history of bladder cancer and both of her siblings. He discussed that if she develops recurrence after this second induction course, there other intravesical agents that can be used but also possible consideration of cystectomy depending on the extent of the disease.  All of her questions were answered today in detail.  Plan for BCG 6 to begin in 6 weeks from the time of biopsy.  - Urinalysis, Complete  2. History of bladder cancer H/o HgT1 prior to this dx  Return for 5 weeks to start BCG x 6 then cysto at 3 months.  [name], MD  [facility] [address] [place], [place] [address] [phone]

## 2015-11-24 LAB — URINALYSIS, COMPLETE
Bilirubin, UA: NEGATIVE
GLUCOSE, UA: NEGATIVE
Ketones, UA: NEGATIVE
Leukocytes, UA: NEGATIVE
Nitrite, UA: NEGATIVE
PH UA: 5.5 (ref 5.0–7.5)
PROTEIN UA: NEGATIVE
Specific Gravity, UA: <1.005 — ABNORMAL LOW (ref 1.005–1.030)
Urobilinogen, Ur: 0.2 mg/dL (ref 0.2–1.0)

## 2015-11-24 LAB — MICROSCOPIC EXAMINATION: BACTERIA UA: NONE SEEN

## 2015-12-24 ENCOUNTER — Other Ambulatory Visit: Payer: Self-pay | Admitting: Urology

## 2015-12-27 ENCOUNTER — Ambulatory Visit (INDEPENDENT_AMBULATORY_CARE_PROVIDER_SITE_OTHER): Payer: Medicare Other | Admitting: Urology

## 2015-12-27 ENCOUNTER — Encounter: Payer: Self-pay | Admitting: Urology

## 2015-12-27 VITALS — BP 124/75 | HR 88 | Ht 65.0 in | Wt 154.6 lb

## 2015-12-27 DIAGNOSIS — C679 Malignant neoplasm of bladder, unspecified: Secondary | ICD-10-CM

## 2015-12-27 DIAGNOSIS — D090 Carcinoma in situ of bladder: Secondary | ICD-10-CM | POA: Diagnosis not present

## 2015-12-27 LAB — URINALYSIS, COMPLETE
BILIRUBIN UA: NEGATIVE
Glucose, UA: NEGATIVE
KETONES UA: NEGATIVE
LEUKOCYTES UA: NEGATIVE
Nitrite, UA: NEGATIVE
PH UA: 5.5 (ref 5.0–7.5)
PROTEIN UA: NEGATIVE
SPEC GRAV UA: 1.01 (ref 1.005–1.030)
UUROB: 0.2 mg/dL (ref 0.2–1.0)

## 2015-12-27 LAB — MICROSCOPIC EXAMINATION: RBC, UA: NONE SEEN /hpf (ref 0–?)

## 2015-12-27 MED ORDER — BCG LIVE 50 MG IS SUSR
3.2400 mL | Freq: Once | INTRAVESICAL | Status: AC
  Administered 2015-12-27: 81 mg via INTRAVESICAL

## 2015-12-27 MED ORDER — SODIUM CHLORIDE 0.9% FLUSH: 50.0000 mL | Freq: Once | INTRAVENOUS | Status: DC

## 2015-12-27 MED ORDER — LIDOCAINE HCL 2 % EX GEL
1.0000 | Freq: Once | CUTANEOUS | Status: AC
Start: 2015-12-27 — End: 2015-12-27
  Administered 2015-12-27: 1 via URETHRAL

## 2015-12-27 NOTE — Progress Notes (Signed)
BCG Bladder Instillation  BCG # 1  Due to Bladder Cancer patient is present today for a BCG treatment. Patient was cleaned and prepped in a sterile fashion with betadine and lidocaine 2% jelly was instilled into the urethra.  A 14FR catheter was inserted, urine return was noted 2ml, urine was yellow in color.  61ml of reconstituted BCG was instilled into the bladder. The catheter was then removed. Patient tolerated well, no complications were noted  Preformed by: Zara Council PA-C and Lyndee Hensen CMA  Follow up/ Additional notes: One week

## 2015-12-27 NOTE — Progress Notes (Signed)
12/27/2015 10:50 AM   Adriana Anderson [birth year] [id number]  Referring provider: Madelyn Brunner, MD [facility] [place], [place] [address]  Chief Complaint  Patient presents with  . 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 CT Urogram negative. She returned to the OR on 12/15 for repeat TURBT which was negative.  She is s/p bladder biopsy on 11/15/15 for suspicious erythematous lesion adjacent to her previous resection scar concerning for possible CIS. Unfortunately, pathology confirms this diagnosis.   She does have a strong family history of bladder cancer (siblings).    Today, she feels well.  She has not had any recent cough, dysuria, gross hematuria or suprapubic pain.  She a also has not had any recent fevers, chills, nausea or vomiting.    Her baseline urinary symptomatolgy is frequency, urgency, nocturia and leakage.    PMH: Past Medical History  Diagnosis Date  . Bladder mass   . Acid reflux   . Bladder cancer ([place])   . GERD (gastroesophageal reflux disease)   . Liver disease   . Diabetes mellitus, type 2 ([place])   . History of cystoscopy   . History of biopsy of bladder   . PONV (postoperative nausea and vomiting)     Surgical History: Past Surgical History  Procedure Laterality Date  . Appendectomy    . Toe surgery    . Cystoscopy with biopsy N/A 11/15/2015    Procedure: CYSTOSCOPY WITH BIOPSY;  Surgeon: [name], MD;  Location: [facility] ORS;  Service: Urology;  Laterality: N/A;    Home Medications:    Medication List       This list is accurate as of: 12/27/15 10:50 AM.  Always use your most recent med list.               CALTRATE 600+D 600-400 MG-UNIT chew  tablet  Generic drug:  Calcium Carbonate-Vitamin D  Chew 1 tablet by mouth 2 (two) times daily.     CLARITIN PO  Take 1 tablet by mouth daily as needed.     DEXILANT 60 MG capsule  Generic drug:  dexlansoprazole  Take 60 mg by mouth daily.     ESTRACE VAGINAL 0.1 MG/GM vaginal cream  Generic drug:  estradiol  USE 1 APPLICATION VAGINALLY ONCE A WEEK     PROBIOTIC DAILY PO  Take 1 capsule by mouth daily.     solifenacin 10 MG tablet  Commonly known as:  VESICARE  Take 1 tablet (10 mg total) by mouth daily.        Allergies:  Allergies  Allergen Reactions  . Tetanus Toxoids Swelling  . Tetanus-Diphth-Acell Pertussis Swelling  . Sulfa Antibiotics Rash    Family History: Family History  Problem Relation Age of Onset  . Bladder Cancer Brother     x 3   . Brain cancer Father   . Diabetes    . Kidney disease Father     Social History:  reports that she quit smoking about 33 years ago. She does not have any smokeless tobacco history on file. She reports that she drinks alcohol. She reports that she does not  use illicit drugs.  ROS: UROLOGY Frequent Urination?: Yes Hard to postpone urination?: Yes Burning/pain with urination?: No Get up at night to urinate?: Yes Leakage of urine?: Yes Urine stream starts and stops?: No Trouble starting stream?: No Do you have to strain to urinate?: No Blood in urine?: No Urinary tract infection?: No Sexually transmitted disease?: No Injury to kidneys or bladder?: No Painful intercourse?: No Weak stream?: No Currently pregnant?: No Vaginal bleeding?: No Last menstrual period?: n  Gastrointestinal Nausea?: No Vomiting?: No Indigestion/heartburn?: Yes Diarrhea?: No Constipation?: Yes  Constitutional Fever: No Night sweats?: No Weight loss?: No Fatigue?: No  Skin Skin rash/lesions?: No Itching?: No  Eyes Blurred vision?: No Double vision?: No  Ears/Nose/Throat Sore throat?: No Sinus problems?:  No  Hematologic/Lymphatic Swollen glands?: No Easy bruising?: Yes  Cardiovascular Leg swelling?: No Chest pain?: No  Respiratory Cough?: No Shortness of breath?: No  Endocrine Excessive thirst?: No  Musculoskeletal Back pain?: Yes Joint pain?: No  Neurological Headaches?: No Dizziness?: No  Psychologic Depression?: No Anxiety?: No  Physical Exam: BP 124/75 mmHg  Pulse 88  Ht 5\' 5"  (1.651 m)  Wt 154 lb 9.6 oz (70.126 kg)  BMI 25.73 kg/m2  Constitutional: Well nourished. Alert and oriented, No acute distress. HEENT: [place] AT, moist mucus membranes. Trachea midline, no masses. Cardiovascular: No clubbing, cyanosis, or edema. Respiratory: Normal respiratory effort, no increased work of breathing. GI: Abdomen is soft, non tender, non distended, no abdominal masses.  Skin: No rashes, bruises or suspicious lesions. Lymph: No cervical or inguinal adenopathy. Neurologic: Grossly intact, no focal deficits, moving all 4 extremities. Psychiatric: Normal mood and affect.  Laboratory Data: Lab Results  Component Value Date   WBC 5.7 06/08/2012   HGB 11.8* 06/08/2012   HCT 32.6* 06/08/2012   MCV 95 06/08/2012   PLT 158 06/08/2012   Lab Results  Component Value Date   CREATININE 0.96 11/04/2015   Lab Results  Component Value Date   AST 35 08/20/2014   Lab Results  Component Value Date   ALT 55 08/20/2014   Urinalysis Results for orders placed or performed in visit on 12/27/15  Microscopic Examination  Result Value Ref Range   WBC, UA 0-5 0 -  5 /hpf   RBC, UA None seen 0 -  2 /hpf   Epithelial Cells (non renal) 0-10 0 - 10 /hpf   Bacteria, UA Few (A) None seen/Few  Urinalysis, Complete  Result Value Ref Range   Specific Gravity, UA 1.010 1.005 - 1.030   pH, UA 5.5 5.0 - 7.5   Color, UA Yellow Yellow   Appearance Ur Clear Clear   Leukocytes, UA Negative Negative   Protein, UA Negative Negative/Trace   Glucose, UA Negative Negative   Ketones, UA  Negative Negative   RBC, UA Trace (A) Negative   Bilirubin, UA Negative Negative   Urobilinogen, Ur 0.2 0.2 - 1.0 mg/dL   Nitrite, UA Negative Negative   Microscopic Examination See below:      Assessment & Plan:    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  She tolerated the installment.  Return next week for #2/6 BCG.  Patient is instructed to contact the office if she develops a high grade fever, chills, cough, night sweats or gross hematuria.  She will pour bleach in her toilet for the next 6 hours after she urinates.    - Urinalysis, Complete  2. Malignant neoplasm of urinary bladder,  unspecified site [facility]):  H/o HgT1 prior to this dx  Return in about 1 week (around 01/03/2016) for # 2 BCG.  These notes generated with voice recognition software. I apologize for typographical errors.  Zara Council, [facility] [address] [place], [place] [address] [phone]

## 2015-12-31 DIAGNOSIS — C679 Malignant neoplasm of bladder, unspecified: Secondary | ICD-10-CM | POA: Insufficient documentation

## 2015-12-31 DIAGNOSIS — D090 Carcinoma in situ of bladder: Secondary | ICD-10-CM | POA: Insufficient documentation

## 2016-01-03 ENCOUNTER — Ambulatory Visit (INDEPENDENT_AMBULATORY_CARE_PROVIDER_SITE_OTHER): Payer: Medicare Other | Admitting: Urology

## 2016-01-03 ENCOUNTER — Encounter: Payer: Self-pay | Admitting: Urology

## 2016-01-03 VITALS — BP 143/78 | HR 99 | Ht 65.0 in | Wt 156.1 lb

## 2016-01-03 DIAGNOSIS — D090 Carcinoma in situ of bladder: Secondary | ICD-10-CM | POA: Diagnosis not present

## 2016-01-03 DIAGNOSIS — C679 Malignant neoplasm of bladder, unspecified: Secondary | ICD-10-CM

## 2016-01-03 MED ORDER — LIDOCAINE HCL 2 % EX GEL
1.0000 "application " | Freq: Once | CUTANEOUS | Status: AC
  Administered 2016-01-03: 1 via URETHRAL

## 2016-01-03 MED ORDER — SODIUM CHLORIDE 0.9% FLUSH: 50.0000 mL | Freq: Once | INTRAVENOUS | Status: DC

## 2016-01-03 MED ORDER — URIBEL 118 MG PO CAPS: 1.0000 | ORAL_CAPSULE | Freq: Four times a day (QID) | ORAL | Status: DC

## 2016-01-03 MED ORDER — BCG LIVE 50 MG IS SUSR
3.2400 mL | Freq: Once | INTRAVESICAL | Status: AC
  Administered 2016-01-03: 81 mg via INTRAVESICAL

## 2016-01-03 NOTE — Progress Notes (Signed)
3:09 PM   Adriana Anderson [birth year] [id number]  Referring provider: Madelyn Brunner, MD [facility] [place], [place] [address]  Chief Complaint  Patient presents with  . 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 CT Urogram negative. She returned to the OR on 12/15 for repeat TURBT which was negative.  She is s/p bladder biopsy on 11/15/15 for suspicious erythematous lesion adjacent to her previous resection scar concerning for possible CIS. Unfortunately, pathology confirms this diagnosis.   She does have a strong family history of bladder cancer (siblings).    Today, she feels well.  She has not had any recent cough, dysuria, gross hematuria or suprapubic pain.  She a also has not had any recent fevers, chills, nausea or vomiting.  She did admit to one day of dysuria after receiving the BCG treatment.  She also experienced four days of fatigue and malaise.  She has not been experiencing night sweats or a new cough.    Her baseline urinary symptomatolgy is frequency, urgency, nocturia and leakage.    PMH: Past Medical History  Diagnosis Date  . Bladder mass   . Acid reflux   . Bladder cancer ([place])   . GERD (gastroesophageal reflux disease)   . Liver disease   . Diabetes mellitus, type 2 ([place])   . History of cystoscopy   . History of biopsy of bladder   . PONV (postoperative nausea and vomiting)     Surgical History: Past Surgical History  Procedure Laterality Date  . Appendectomy    . Toe surgery    . Cystoscopy with biopsy N/A 11/15/2015    Procedure: CYSTOSCOPY WITH BIOPSY;  Surgeon: [name], MD;  Location: [facility] ORS;  Service: Urology;  Laterality: N/A;    Home  Medications:    Medication List       This list is accurate as of: 01/03/16  3:09 PM.  Always use your most recent med list.               CALTRATE 600+D 600-400 MG-UNIT chew tablet  Generic drug:  Calcium Carbonate-Vitamin D  Chew 1 tablet by mouth 2 (two) times daily.     CLARITIN PO  Take 1 tablet by mouth daily as needed.     DEXILANT 60 MG capsule  Generic drug:  dexlansoprazole  Take 60 mg by mouth daily.     ESTRACE VAGINAL 0.1 MG/GM vaginal cream  Generic drug:  estradiol  USE 1 APPLICATION VAGINALLY ONCE A WEEK     PROBIOTIC DAILY PO  Take 1 capsule by mouth daily.     solifenacin 10 MG tablet  Commonly known as:  VESICARE  Take 1 tablet (10 mg total) by mouth daily.     URIBEL 118 MG Caps  Take 1 capsule (118 mg total) by mouth QID.        Allergies:  Allergies  Allergen Reactions  . Tetanus Toxoids Swelling  . Tetanus-Diphth-Acell Pertussis Swelling  . Sulfa Antibiotics Rash    Family History: Family History  Problem Relation Age of Onset  . Bladder Cancer Brother     x  3   . Brain cancer Father   . Diabetes    . Kidney disease Father     Social History:  reports that she quit smoking about 33 years ago. She does not have any smokeless tobacco history on file. She reports that she drinks alcohol. She reports that she does not use illicit drugs.  ROS: UROLOGY Frequent Urination?: Yes Hard to postpone urination?: Yes Burning/pain with urination?: No Get up at night to urinate?: Yes Leakage of urine?: Yes Urine stream starts and stops?: No Trouble starting stream?: No Do you have to strain to urinate?: No Blood in urine?: No Urinary tract infection?: No Sexually transmitted disease?: No Injury to kidneys or bladder?: No Painful intercourse?: No Weak stream?: No Currently pregnant?: No Vaginal bleeding?: No Last menstrual period?: n  Gastrointestinal Nausea?: No Vomiting?: No Indigestion/heartburn?: Yes Diarrhea?:  No Constipation?: No  Constitutional Fever: No Night sweats?: No Weight loss?: No Fatigue?: No  Skin Skin rash/lesions?: No Itching?: No  Eyes Blurred vision?: No Double vision?: No  Ears/Nose/Throat Sore throat?: No Sinus problems?: No  Hematologic/Lymphatic Swollen glands?: No Easy bruising?: No  Cardiovascular Leg swelling?: No Chest pain?: No  Respiratory Cough?: Yes Shortness of breath?: No  Endocrine Excessive thirst?: No  Musculoskeletal Back pain?: Yes Joint pain?: No  Neurological Headaches?: No Dizziness?: No  Psychologic Depression?: No Anxiety?: No  Physical Exam: BP 143/78 mmHg  Pulse 99  Ht 5\' 5"  (1.651 m)  Wt 156 lb 1.6 oz (70.806 kg)  BMI 25.98 kg/m2  Constitutional: Well nourished. Alert and oriented, No acute distress. HEENT: [place] AT, moist mucus membranes. Trachea midline, no masses. Cardiovascular: No clubbing, cyanosis, or edema. Respiratory: Normal respiratory effort, no increased work of breathing. GI: Abdomen is soft, non tender, non distended, no abdominal masses.  Skin: No rashes, bruises or suspicious lesions. Lymph: No cervical or inguinal adenopathy. Neurologic: Grossly intact, no focal deficits, moving all 4 extremities. Psychiatric: Normal mood and affect.  Laboratory Data: Lab Results  Component Value Date   WBC 5.7 06/08/2012   HGB 11.8* 06/08/2012   HCT 32.6* 06/08/2012   MCV 95 06/08/2012   PLT 158 06/08/2012   Lab Results  Component Value Date   CREATININE 0.96 11/04/2015   Lab Results  Component Value Date   AST 35 08/20/2014   Lab Results  Component Value Date   ALT 55 08/20/2014   Urinalysis Results for orders placed or performed in visit on 01/03/16  Microscopic Examination  Result Value Ref Range   WBC, UA 0-5 0 -  5 /hpf   RBC, UA 0-2 0 -  2 /hpf   Epithelial Cells (non renal) 0-10 0 - 10 /hpf   Bacteria, UA None seen None seen/Few  Urinalysis, Complete  Result Value Ref Range    Specific Gravity, UA <1.005 (L) 1.005 - 1.030   pH, UA 5.0 5.0 - 7.5   Color, UA Yellow Yellow   Appearance Ur Clear Clear   Leukocytes, UA Negative Negative   Protein, UA Negative Negative/Trace   Glucose, UA Negative Negative   Ketones, UA Negative Negative   RBC, UA Trace (A) Negative   Bilirubin, UA Negative Negative   Urobilinogen, Ur 0.2 0.2 - 1.0 mg/dL   Nitrite, UA Negative Negative   Microscopic Examination See below:      Assessment & Plan:    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  She tolerated the installment.  Return  next week for #3/6 BCG.  Patient is instructed to contact the office if she develops a high grade fever, chills, cough, night sweats or gross hematuria.  She will pour bleach in her toilet for the next 6 hours after she urinates.    - Urinalysis, Complete  2. Malignant neoplasm of urinary bladder, unspecified site [facility]):  H/o HgT1 prior to this dx  Return in about 1 week (around 01/10/2016) for # 3 BCG.  These notes generated with voice recognition software. I apologize for typographical errors.  Zara Council, [facility] [address], [place], [place] [address] [phone]

## 2016-01-03 NOTE — Progress Notes (Signed)
BCG Bladder Instillation  BCG # 2  Due to Bladder Cancer patient is present today for a BCG treatment. Patient was cleaned and prepped in a sterile fashion with betadine and lidocaine 2% jelly was instilled into the urethra.  A 14FR catheter was inserted, urine return was noted 18ml, urine was clear in color.  43ml of reconstituted BCG was instilled into the bladder. The catheter was then removed. Patient tolerated well, no complications were noted  Preformed by: Lyndee Hensen CMA and Zara Council PA-C  Follow up/ Additional notes:One week

## 2016-01-04 LAB — URINALYSIS, COMPLETE
Bilirubin, UA: NEGATIVE
Glucose, UA: NEGATIVE
KETONES UA: NEGATIVE
LEUKOCYTES UA: NEGATIVE
NITRITE UA: NEGATIVE
PH UA: 5 (ref 5.0–7.5)
Protein, UA: NEGATIVE
Specific Gravity, UA: <1.005 — ABNORMAL LOW (ref 1.005–1.030)
Urobilinogen, Ur: 0.2 mg/dL (ref 0.2–1.0)

## 2016-01-04 LAB — MICROSCOPIC EXAMINATION: BACTERIA UA: NONE SEEN

## 2016-01-10 ENCOUNTER — Ambulatory Visit: Payer: Medicare Other | Admitting: Urology

## 2016-01-10 ENCOUNTER — Encounter: Payer: Self-pay | Admitting: Urology

## 2016-01-10 ENCOUNTER — Ambulatory Visit (INDEPENDENT_AMBULATORY_CARE_PROVIDER_SITE_OTHER): Payer: Medicare Other | Admitting: Urology

## 2016-01-10 ENCOUNTER — Ambulatory Visit: Payer: Medicare Other

## 2016-01-10 VITALS — BP 125/74 | HR 99 | Ht 65.0 in | Wt 153.4 lb

## 2016-01-10 DIAGNOSIS — D090 Carcinoma in situ of bladder: Secondary | ICD-10-CM | POA: Diagnosis not present

## 2016-01-10 LAB — MICROSCOPIC EXAMINATION
Bacteria, UA: NONE SEEN
RBC, UA: NONE SEEN /hpf (ref 0–?)

## 2016-01-10 LAB — URINALYSIS, COMPLETE
BILIRUBIN UA: NEGATIVE
Glucose, UA: NEGATIVE
KETONES UA: NEGATIVE
LEUKOCYTES UA: NEGATIVE
NITRITE UA: NEGATIVE
Protein, UA: NEGATIVE
RBC UA: NEGATIVE
SPEC GRAV UA: 1.01 (ref 1.005–1.030)
Urobilinogen, Ur: 0.2 mg/dL (ref 0.2–1.0)
pH, UA: 5.5 (ref 5.0–7.5)

## 2016-01-10 MED ORDER — LIDOCAINE HCL 2 % EX GEL
1.0000 "application " | Freq: Once | CUTANEOUS | Status: AC
  Administered 2016-01-10: 1 via URETHRAL

## 2016-01-10 MED ORDER — BCG LIVE 50 MG IS SUSR
3.2400 mL | Freq: Once | INTRAVESICAL | Status: AC
  Administered 2016-01-10: 81 mg via INTRAVESICAL

## 2016-01-10 NOTE — Progress Notes (Signed)
BCG Bladder Instillation  BCG # 3  Due to Bladder Cancer patient is present today for a BCG treatment. Patient was cleaned and prepped in a sterile fashion with betadine and lidocaine 2% jelly was instilled into the urethra.  A 14FR catheter was inserted, urine return was noted 9ml, urine was clear and light yellow in color.  28ml of reconstituted BCG was instilled into the bladder. The catheter was then removed. Patient tolerated well, no complications were noted  Preformed by: Lyndee Hensen CMA  Follow up/ Additional notes: One week

## 2016-01-10 NOTE — Progress Notes (Signed)
9:21 AM   Adriana Anderson 10/31/1940 [id number]  Referring provider: Madelyn Brunner, MD [facility] [place], [place] [address]  Chief Complaint  Patient presents with  . 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 CT Urogram negative. She returned to the OR on 12/15 for repeat TURBT which was negative.  She is s/p bladder biopsy on 11/15/15 for suspicious erythematous lesion adjacent to her previous resection scar concerning for possible CIS. Unfortunately, pathology confirms this diagnosis.   She does have a strong family history of bladder cancer (siblings).    Today, she feels well.  Some irritative voiding symptoms as expected. No fevers / gross hematuria.   PMH: Past Medical History  Diagnosis Date  . Bladder mass   . Acid reflux   . Bladder cancer ([place])   . GERD (gastroesophageal reflux disease)   . Liver disease   . Diabetes mellitus, type 2 ([place])   . History of cystoscopy   . History of biopsy of bladder   . PONV (postoperative nausea and vomiting)     Surgical History: Past Surgical History  Procedure Laterality Date  . Appendectomy    . Toe surgery    . Cystoscopy with biopsy N/A 11/15/2015    Procedure: CYSTOSCOPY WITH BIOPSY;  Surgeon: [name], MD;  Location: [facility] ORS;  Service: Urology;  Laterality: N/A;    Home Medications:    Medication List       This list is accurate as of: 01/10/16  9:21 AM.  Always use your most recent med list.               CALTRATE 600+D 600-400 MG-UNIT chew tablet  Generic drug:  Calcium Carbonate-Vitamin D  Chew 1 tablet by mouth 2 (two) times daily.     CLARITIN PO  Take 1 tablet by mouth daily as needed.     DEXILANT 60  MG capsule  Generic drug:  dexlansoprazole  Take 60 mg by mouth daily.     ESTRACE VAGINAL 0.1 MG/GM vaginal cream  Generic drug:  estradiol  USE 1 APPLICATION VAGINALLY ONCE A WEEK     PROBIOTIC DAILY PO  Take 1 capsule by mouth daily.     solifenacin 10 MG tablet  Commonly known as:  VESICARE  Take 1 tablet (10 mg total) by mouth daily.     URIBEL 118 MG Caps  Take 1 capsule (118 mg total) by mouth QID.        Allergies:  Allergies  Allergen Reactions  . Tetanus Toxoids Swelling  . Tetanus-Diphth-Acell Pertussis Swelling  . Sulfa Antibiotics Rash    Family History: Family History  Problem Relation Age of Onset  . Bladder Cancer Brother     x 3   . Brain cancer Father   . Diabetes    . Kidney disease Father     Social History:  reports that she quit smoking about 33 years ago. She does not have any smokeless tobacco history on file. She reports that she drinks alcohol. She reports that she does not use illicit drugs.  ROS:   Review of Systems  Gastrointestinal (upper)  : Negative for upper GI symptoms  Gastrointestinal (lower) : Negative for lower GI symptoms  Constitutional : Negative for symptoms  Skin: Negative for skin symptoms  Eyes: Negative for eye symptoms  Ear/Nose/Throat : Negative for Ear/Nose/Throat symptoms  Hematologic/Lymphatic: Negative for Hematologic/Lymphatic symptoms  Cardiovascular : Negative for cardiovascular symptoms  Respiratory : Negative for respiratory symptoms  Endocrine: Negative for endocrine symptoms  Musculoskeletal: Negative for musculoskeletal symptoms  Neurological: Negative for neurological symptoms  Psychologic: Negative for psychiatric symptoms      Physical Exam: BP 125/74 mmHg  Pulse 99  Ht 5\' 5"  (1.651 m)  Wt 153 lb 6.4 oz (69.582 kg)  BMI 25.53 kg/m2  Constitutional: Well nourished. Alert and oriented, No acute distress. HEENT: [place] AT, moist mucus membranes. Trachea midline, no  masses. Cardiovascular: No clubbing, cyanosis, or edema. Respiratory: Normal respiratory effort, no increased work of breathing. GI: Abdomen is soft, non tender, non distended, no abdominal masses.  Skin: No rashes, bruises or suspicious lesions. Lymph: No cervical or inguinal adenopathy. Neurologic: Grossly intact, no focal deficits, moving all 4 extremities. Psychiatric: Normal mood and affect.  Laboratory Data: Lab Results  Component Value Date   WBC 5.7 06/08/2012   HGB 11.8* 06/08/2012   HCT 32.6* 06/08/2012   MCV 95 06/08/2012   PLT 158 06/08/2012   Lab Results  Component Value Date   CREATININE 0.96 11/04/2015   Lab Results  Component Value Date   AST 35 08/20/2014   Lab Results  Component Value Date   ALT 55 08/20/2014   Urinalysis Results for orders placed or performed in visit on 01/03/16  Microscopic Examination  Result Value Ref Range   WBC, UA 0-5 0 -  5 /hpf   RBC, UA 0-2 0 -  2 /hpf   Epithelial Cells (non renal) 0-10 0 - 10 /hpf   Bacteria, UA None seen None seen/Few  Urinalysis, Complete  Result Value Ref Range   Specific Gravity, UA <1.005 (L) 1.005 - 1.030   pH, UA 5.0 5.0 - 7.5   Color, UA Yellow Yellow   Appearance Ur Clear Clear   Leukocytes, UA Negative Negative   Protein, UA Negative Negative/Trace   Glucose, UA Negative Negative   Ketones, UA Negative Negative   RBC, UA Trace (A) Negative   Bilirubin, UA Negative Negative   Urobilinogen, Ur 0.2 0.2 - 1.0 mg/dL   Nitrite, UA Negative Negative   Microscopic Examination See below:      Assessment & Plan:    1. CIS (carcinoma in situ of bladder):   Patient received her 3rd  BCG today.  She tolerated the installment.  Return next week for #4/6 BCG.  Patient is instructed to contact the office if she develops a high grade fever, chills, cough, night sweats or gross hematuria.  She will pour bleach in her toilet for the next 6 hours after she urinates.     2. Malignant neoplasm of urinary  bladder, unspecified site [facility]):  H/o HgT1 prior to this dx  Rec transition to Q12monthly cysto surveillance starting around 6 weeks after 6th BCG instillatoin.   No Follow-up on file.  These notes generated with voice recognition software. I apologize for typographical errors.  Alexis Frock, [facility] [address], [place], [place] [address] [phone]

## 2016-01-10 NOTE — Addendum Note (Signed)
Addended by: Orlene Erm on: 01/10/2016 10:01 AM   Modules accepted: Orders

## 2016-01-17 ENCOUNTER — Ambulatory Visit (INDEPENDENT_AMBULATORY_CARE_PROVIDER_SITE_OTHER): Payer: Medicare Other | Admitting: Urology

## 2016-01-17 ENCOUNTER — Encounter: Payer: Self-pay | Admitting: Urology

## 2016-01-17 VITALS — BP 122/75 | HR 93 | Ht 65.0 in | Wt 154.1 lb

## 2016-01-17 DIAGNOSIS — D090 Carcinoma in situ of bladder: Secondary | ICD-10-CM | POA: Diagnosis not present

## 2016-01-17 DIAGNOSIS — C679 Malignant neoplasm of bladder, unspecified: Secondary | ICD-10-CM | POA: Diagnosis not present

## 2016-01-17 LAB — URINALYSIS, COMPLETE
BILIRUBIN UA: NEGATIVE
Glucose, UA: NEGATIVE
Ketones, UA: NEGATIVE
Leukocytes, UA: NEGATIVE
Nitrite, UA: NEGATIVE
PH UA: 5.5 (ref 5.0–7.5)
Protein, UA: NEGATIVE
Specific Gravity, UA: 1.015 (ref 1.005–1.030)
UUROB: 0.2 mg/dL (ref 0.2–1.0)

## 2016-01-17 LAB — MICROSCOPIC EXAMINATION: RBC, UA: NONE SEEN /hpf (ref 0–?)

## 2016-01-17 MED ORDER — SODIUM CHLORIDE 0.9 % IJ SOLN
50.0000 mL | Freq: Once | INTRAMUSCULAR | Status: AC
  Administered 2016-01-17: 50 mL

## 2016-01-17 MED ORDER — LIDOCAINE HCL 2 % EX GEL
1.0000 "application " | Freq: Once | CUTANEOUS | Status: AC
  Administered 2016-01-17: 1 via URETHRAL

## 2016-01-17 MED ORDER — BCG LIVE 50 MG IS SUSR
3.2400 mL | Freq: Once | INTRAVESICAL | Status: AC
  Administered 2016-01-17: 81 mg via INTRAVESICAL

## 2016-01-17 NOTE — Progress Notes (Signed)
10:55 AM   Adriana Anderson [birth year] [id number]  Referring provider: Madelyn Brunner, MD [facility] [place], [place] [address]  Chief Complaint  Patient presents with  . 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 CT Urogram negative in 2015.   She returned to the OR on 12/15 for repeat TURBT which was negative.  She is s/p bladder biopsy on 11/15/15 for suspicious erythematous lesion adjacent to her previous resection scar concerning for possible CIS. Unfortunately, pathology confirms this diagnosis.   She does have a strong family history of bladder cancer (siblings).    She did undergo a CT scan of the abdomen and pelvis with contrast in February 2016 with Dr. Pat Patrick for abdominal pain and elevated white blood cell count.  Found to have appendicitis and her appendix was removed.  Today, she feels well.  She has not had any recent cough, dysuria, gross hematuria or suprapubic pain.  She a also has not had any recent chills, nausea or vomiting.  She did admit to having a low-grade fever of 24F for 2 days after the installation.  She has not been experiencing night sweats or a new cough.    Her baseline urinary symptomatolgy is frequency, urgency, nocturia and leakage.    PMH: Past Medical History  Diagnosis Date  . Bladder mass   . Acid reflux   . Bladder cancer ([place])   . GERD (gastroesophageal reflux disease)   . Liver disease   . Diabetes mellitus, type 2 ([place])   . History of cystoscopy   . History of biopsy of bladder   . PONV (postoperative nausea and vomiting)     Surgical History: Past Surgical History  Procedure Laterality Date  . Appendectomy    . Toe surgery     . Cystoscopy with biopsy N/A 11/15/2015    Procedure: CYSTOSCOPY WITH BIOPSY;  Surgeon: [name], MD;  Location: [facility] ORS;  Service: Urology;  Laterality: N/A;    Home Medications:    Medication List       This list is accurate as of: 01/17/16 10:55 AM.  Always use your most recent med list.               CALTRATE 600+D 600-400 MG-UNIT chew tablet  Generic drug:  Calcium Carbonate-Vitamin D  Chew 1 tablet by mouth 2 (two) times daily.     CLARITIN PO  Take 1 tablet by mouth daily as needed.     DEXILANT 60 MG capsule  Generic drug:  dexlansoprazole  Take 60 mg by mouth daily.     ESTRACE VAGINAL 0.1 MG/GM vaginal cream  Generic drug:  estradiol  USE 1 APPLICATION VAGINALLY ONCE A WEEK     PROBIOTIC DAILY PO  Take 1 capsule by mouth daily.     solifenacin 10 MG tablet  Commonly known as:  VESICARE  Take 1 tablet (10 mg total) by mouth daily.     URIBEL 118 MG Caps  Take 1 capsule (118 mg total) by mouth QID.        Allergies:  Allergies  Allergen Reactions  . Tetanus  Toxoids Swelling  . Tetanus-Diphth-Acell Pertussis Swelling  . Sulfa Antibiotics Rash    Family History: Family History  Problem Relation Age of Onset  . Bladder Cancer Brother     x 3   . Brain cancer Father   . Diabetes    . Kidney disease Father     Social History:  reports that she quit smoking about 33 years ago. She does not have any smokeless tobacco history on file. She reports that she drinks alcohol. She reports that she does not use illicit drugs.  ROS: UROLOGY Frequent Urination?: Yes Hard to postpone urination?: Yes Burning/pain with urination?: No Get up at night to urinate?: Yes Leakage of urine?: Yes Urine stream starts and stops?: No Trouble starting stream?: No Do you have to strain to urinate?: No Blood in urine?: No Urinary tract infection?: No Sexually transmitted disease?: No Injury to kidneys or bladder?: No Painful intercourse?: No Weak stream?:  No Currently pregnant?: No Vaginal bleeding?: No Last menstrual period?: n  Gastrointestinal Nausea?: No Vomiting?: No Indigestion/heartburn?: Yes Diarrhea?: No Constipation?: Yes  Constitutional Fever: No (pt states last week she developed low-grade fever 99.5 Temp.) Night sweats?: No Weight loss?: No Fatigue?: No (fatigue after last BCG that lasted only the day of. )  Skin Skin rash/lesions?: No Itching?: No  Eyes Blurred vision?: No Double vision?: No  Ears/Nose/Throat Sore throat?: No Sinus problems?: No  Hematologic/Lymphatic Swollen glands?: No Easy bruising?: Yes  Cardiovascular Leg swelling?: No Chest pain?: No  Respiratory Cough?: Yes Shortness of breath?: No  Endocrine Excessive thirst?: No  Musculoskeletal Back pain?: Yes Joint pain?: No  Neurological Headaches?: No Dizziness?: No  Psychologic Depression?: No Anxiety?: No  Physical Exam: BP 122/75 mmHg  Pulse 93  Ht 5\' 5"  (1.651 m)  Wt 154 lb 1.6 oz (69.899 kg)  BMI 25.64 kg/m2  Constitutional: Well nourished. Alert and oriented, No acute distress. HEENT: [place] AT, moist mucus membranes. Trachea midline, no masses. Cardiovascular: No clubbing, cyanosis, or edema. Respiratory: Normal respiratory effort, no increased work of breathing. GI: Abdomen is soft, non tender, non distended, no abdominal masses.  Skin: No rashes, bruises or suspicious lesions. Lymph: No cervical or inguinal adenopathy. Neurologic: Grossly intact, no focal deficits, moving all 4 extremities. Psychiatric: Normal mood and affect.  Laboratory Data: Lab Results  Component Value Date   WBC 5.7 06/08/2012   HGB 11.8* 06/08/2012   HCT 32.6* 06/08/2012   MCV 95 06/08/2012   PLT 158 06/08/2012   Lab Results  Component Value Date   CREATININE 0.96 11/04/2015   Lab Results  Component Value Date   AST 35 08/20/2014   Lab Results  Component Value Date   ALT 55 08/20/2014   Urinalysis Results for orders  placed or performed in visit on 01/17/16  Microscopic Examination  Result Value Ref Range   WBC, UA 6-10 (A) 0 -  5 /hpf   RBC, UA None seen 0 -  2 /hpf   Epithelial Cells (non renal) 0-10 0 - 10 /hpf   Mucus, UA Present (A) Not Estab.   Bacteria, UA Few (A) None seen/Few  Urinalysis, Complete  Result Value Ref Range   Specific Gravity, UA 1.015 1.005 - 1.030   pH, UA 5.5 5.0 - 7.5   Color, UA Yellow Yellow   Appearance Ur Clear Clear   Leukocytes, UA Negative Negative   Protein, UA Negative Negative/Trace   Glucose, UA Negative Negative   Ketones, UA Negative Negative   RBC,  UA Trace (A) Negative   Bilirubin, UA Negative Negative   Urobilinogen, Ur 0.2 0.2 - 1.0 mg/dL   Nitrite, UA Negative Negative   Microscopic Examination See below:      Assessment & Plan:    1. CIS (carcinoma in situ of bladder):   Patient received her 4th  BCG today.  She tolerated the installment.  Return next week for #5/6 BCG.  Patient is instructed to contact the office if she develops a high grade fever, chills, cough, night sweats or gross hematuria.  She will pour bleach in her toilet for the next 6 hours after she urinates.    - Urinalysis, Complete  2. Malignant neoplasm of urinary bladder, unspecified site [facility]):  H/o HgT1 prior to this dx  Return in about 1 week (around 01/24/2016) for # 5 BCG.  These notes generated with voice recognition software. I apologize for typographical errors.  Zara Council, [facility] [address], [place], [place] [address] [phone]

## 2016-01-17 NOTE — Progress Notes (Signed)
BCG Bladder Instillation  BCG # 4  Due to Bladder Cancer patient is present today for a BCG treatment. Patient was cleaned and prepped in a sterile fashion with betadine and lidocaine 2% jelly was instilled into the urethra.  A 12 FR catheter was inserted, urine return was noted 20 ml, urine was yellow in color.  90ml of reconstituted BCG was instilled into the bladder. The catheter was then removed. Patient tolerated well, no complications were noted  Preformed by: Pope  Follow up/ Additional notes: Pt instructed she needs to f/u in 1 week. Pt also reported that after her last BCG she developed what she called a fever 99.5 Temp and fatigue that lasted only that day, but subsided.

## 2016-01-24 ENCOUNTER — Ambulatory Visit (INDEPENDENT_AMBULATORY_CARE_PROVIDER_SITE_OTHER): Payer: Medicare Other | Admitting: Urology

## 2016-01-24 VITALS — BP 138/68 | HR 93 | Ht 65.0 in | Wt 156.0 lb

## 2016-01-24 DIAGNOSIS — C679 Malignant neoplasm of bladder, unspecified: Secondary | ICD-10-CM

## 2016-01-24 DIAGNOSIS — D090 Carcinoma in situ of bladder: Secondary | ICD-10-CM | POA: Diagnosis not present

## 2016-01-24 LAB — URINALYSIS, COMPLETE
BILIRUBIN UA: NEGATIVE
GLUCOSE, UA: NEGATIVE
Ketones, UA: NEGATIVE
Leukocytes, UA: NEGATIVE
NITRITE UA: NEGATIVE
PH UA: 7 (ref 5.0–7.5)
PROTEIN UA: NEGATIVE
RBC UA: NEGATIVE
Specific Gravity, UA: 1.015 (ref 1.005–1.030)
UUROB: 0.2 mg/dL (ref 0.2–1.0)

## 2016-01-24 LAB — MICROSCOPIC EXAMINATION
BACTERIA UA: NONE SEEN
RBC MICROSCOPIC, UA: NONE SEEN /HPF (ref 0–?)

## 2016-01-24 MED ORDER — SODIUM CHLORIDE 0.9% FLUSH: 50.0000 mL | Freq: Once | INTRAVENOUS | Status: DC

## 2016-01-24 MED ORDER — BCG LIVE 50 MG IS SUSR
3.2400 mL | Freq: Once | INTRAVESICAL | Status: AC
  Administered 2016-01-24: 81 mg via INTRAVESICAL

## 2016-01-24 NOTE — Progress Notes (Signed)
9:16 AM   Adriana Anderson 1942-09-74 [id number]  Referring provider: Madelyn Brunner, MD [facility] [place], [place] [address]  Chief Complaint  Patient presents with  . 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 CT Urogram negative in 2015.   She returned to the OR on 12/15 for repeat TURBT which was negative.  She is s/p bladder biopsy on 11/15/15 for suspicious erythematous lesion adjacent to her previous resection scar concerning for possible CIS. Unfortunately, pathology confirms this diagnosis.   She does have a strong family history of bladder cancer (siblings).    She did undergo a CT scan of the abdomen and pelvis with contrast in February 2016 with Dr. Pat Patrick for abdominal pain and elevated white blood cell count.  Found to have appendicitis and her appendix was removed.  Today, she feels well.  She has not had any recent cough, dysuria, gross hematuria or suprapubic pain.  She a also has not had any recent chills, nausea or vomiting.  She did not have fevers after the last installment.  She has not been experiencing night sweats or a new cough.  She does experience dysuria the day of the treatment, but it quickly abates.    Her baseline urinary symptomatolgy is frequency, urgency, nocturia and leakage.    PMH: Past Medical History  Diagnosis Date  . Bladder mass   . Acid reflux   . Bladder cancer ([place])   . GERD (gastroesophageal reflux disease)   . Liver disease   . Diabetes mellitus, type 2 ([place])   . History of cystoscopy   . History of biopsy of bladder   . PONV (postoperative nausea and vomiting)     Surgical History: Past Surgical History  Procedure Laterality Date  .  Appendectomy    . Toe surgery    . Cystoscopy with biopsy N/A 11/15/2015    Procedure: CYSTOSCOPY WITH BIOPSY;  Surgeon: [name], MD;  Location: [facility] ORS;  Service: Urology;  Laterality: N/A;    Home Medications:    Medication List       This list is accurate as of: 01/24/16  9:16 AM.  Always use your most recent med list.               CALTRATE 600+D 600-400 MG-UNIT chew tablet  Generic drug:  Calcium Carbonate-Vitamin D  Chew 1 tablet by mouth 2 (two) times daily.     CLARITIN PO  Take 1 tablet by mouth daily as needed.     DEXILANT 60 MG capsule  Generic drug:  dexlansoprazole  Take 60 mg by mouth daily.     ESTRACE VAGINAL 0.1 MG/GM vaginal cream  Generic drug:  estradiol  USE 1 APPLICATION VAGINALLY ONCE A WEEK     PROBIOTIC DAILY PO  Take 1 capsule by mouth daily.     solifenacin 10 MG tablet  Commonly known as:  VESICARE  Take 1 tablet (10 mg total) by mouth daily.     URIBEL 118 MG Caps  Take 1 capsule (118 mg total) by mouth QID.        Allergies:  Allergies  Allergen Reactions  . Tetanus Toxoids Swelling  . Tetanus-Diphth-Acell Pertussis Swelling  . Sulfa Antibiotics Rash    Family History: Family History  Problem Relation Age of Onset  . Bladder Cancer Brother     x 3   . Brain cancer Father   . Diabetes    . Kidney disease Father     Social History:  reports that she quit smoking about 33 years ago. She does not have any smokeless tobacco history on file. She reports that she drinks alcohol. She reports that she does not use illicit drugs.  ROS: UROLOGY Frequent Urination?: Yes Hard to postpone urination?: Yes Burning/pain with urination?: No Get up at night to urinate?: Yes Leakage of urine?: Yes Urine stream starts and stops?: No Trouble starting stream?: No Do you have to strain to urinate?: No Blood in urine?: No Urinary tract infection?: No Sexually transmitted disease?: No Injury to kidneys or bladder?: No Painful  intercourse?: No Weak stream?: No Currently pregnant?: No Vaginal bleeding?: No Last menstrual period?: n  Gastrointestinal Nausea?: No Vomiting?: No Indigestion/heartburn?: Yes Diarrhea?: No Constipation?: Yes  Constitutional Fever: No Night sweats?: No Weight loss?: No Fatigue?: No  Skin Skin rash/lesions?: No Itching?: No  Eyes Blurred vision?: No Double vision?: No  Ears/Nose/Throat Sore throat?: No Sinus problems?: No  Hematologic/Lymphatic Swollen glands?: No Easy bruising?: No  Cardiovascular Leg swelling?: No Chest pain?: No  Respiratory Cough?: Yes Shortness of breath?: No  Endocrine Excessive thirst?: No  Musculoskeletal Back pain?: Yes Joint pain?: No  Neurological Headaches?: No Dizziness?: No  Psychologic Depression?: No Anxiety?: No  Physical Exam: BP 138/68 mmHg  Pulse 93  Ht 5\' 5"  (1.651 m)  Wt 156 lb (70.761 kg)  BMI 25.96 kg/m2  Constitutional: Well nourished. Alert and oriented, No acute distress. HEENT: [place] AT, moist mucus membranes. Trachea midline, no masses. Cardiovascular: No clubbing, cyanosis, or edema. Respiratory: Normal respiratory effort, no increased work of breathing. GI: Abdomen is soft, non tender, non distended, no abdominal masses.  Skin: No rashes, bruises or suspicious lesions. Lymph: No cervical or inguinal adenopathy. Neurologic: Grossly intact, no focal deficits, moving all 4 extremities. Psychiatric: Normal mood and affect.  Laboratory Data: Lab Results  Component Value Date   WBC 5.7 06/08/2012   HGB 11.8* 06/08/2012   HCT 32.6* 06/08/2012   MCV 95 06/08/2012   PLT 158 06/08/2012   Lab Results  Component Value Date   CREATININE 0.96 11/04/2015   Lab Results  Component Value Date   AST 35 08/20/2014   Lab Results  Component Value Date   ALT 55 08/20/2014   Urinalysis Results for orders placed or performed in visit on 01/24/16  Microscopic Examination  Result Value Ref Range    WBC, UA 0-5 0 -  5 /hpf   RBC, UA None seen 0 -  2 /hpf   Epithelial Cells (non renal) >10 (A) 0 - 10 /hpf   Renal Epithel, UA 0-10 (A) None seen /hpf   Bacteria, UA None seen None seen/Few  Urinalysis, Complete  Result Value Ref Range   Specific Gravity, UA 1.015 1.005 - 1.030   pH, UA 7.0 5.0 - 7.5   Color, UA Yellow Yellow   Appearance Ur Hazy (A) Clear   Leukocytes, UA Negative Negative   Protein, UA Negative Negative/Trace   Glucose, UA Negative Negative   Ketones, UA Negative Negative   RBC, UA Negative Negative   Bilirubin, UA Negative Negative   Urobilinogen, Ur 0.2 0.2 -  1.0 mg/dL   Nitrite, UA Negative Negative   Microscopic Examination See below:     Assessment & Plan:    1. CIS (carcinoma in situ of bladder):   Patient received her 5th  BCG today.  She tolerated the installment.  Return next week for #6/6 BCG.  Patient is instructed to contact the office if she develops a high grade fever, chills, cough, night sweats or gross hematuria.  She will pour bleach in her toilet for the next 6 hours after she urinates.    - Urinalysis, Complete  2. Malignant neoplasm of urinary bladder, unspecified site [facility]):  H/o HgT1 prior to this dx  Return in about 1 week (around 01/31/2016) for # 6 BCG .  These notes generated with voice recognition software. I apologize for typographical errors.  Zara Council, [facility] [address], [place], [place] [address] [phone]

## 2016-01-24 NOTE — Progress Notes (Signed)
BCG Bladder Instillation  BCG # 5  Due to Bladder Cancer patient is present today for a BCG treatment. Patient was cleaned and prepped in a sterile fashion with betadine and lidocaine 2% jelly was instilled into the urethra.  A 14FR catheter was inserted, urine return was noted 38ml, urine was yellow in color.  55ml of reconstituted BCG was instilled into the bladder. The catheter was then removed. Patient tolerated well, no complications were noted  Preformed by: Judson Roch Riel Hirschman, CMA & Zara Council, PAC  Follow up/ Additional notes: follow up 1wk

## 2016-01-31 ENCOUNTER — Ambulatory Visit (INDEPENDENT_AMBULATORY_CARE_PROVIDER_SITE_OTHER): Payer: Medicare Other | Admitting: Urology

## 2016-01-31 ENCOUNTER — Encounter: Payer: Self-pay | Admitting: Urology

## 2016-01-31 VITALS — BP 130/74 | HR 86 | Ht 65.0 in | Wt 152.4 lb

## 2016-01-31 DIAGNOSIS — C679 Malignant neoplasm of bladder, unspecified: Secondary | ICD-10-CM

## 2016-01-31 DIAGNOSIS — D090 Carcinoma in situ of bladder: Secondary | ICD-10-CM

## 2016-01-31 LAB — MICROSCOPIC EXAMINATION
BACTERIA UA: NONE SEEN
Epithelial Cells (non renal): >10 /hpf — AB (ref 0–10)
RBC, UA: NONE SEEN /hpf (ref 0–?)

## 2016-01-31 LAB — URINALYSIS, COMPLETE
Bilirubin, UA: NEGATIVE
Glucose, UA: NEGATIVE
Ketones, UA: NEGATIVE
LEUKOCYTES UA: NEGATIVE
Nitrite, UA: NEGATIVE
PH UA: 5.5 (ref 5.0–7.5)
PROTEIN UA: NEGATIVE
RBC, UA: NEGATIVE
Specific Gravity, UA: 1.01 (ref 1.005–1.030)
Urobilinogen, Ur: 0.2 mg/dL (ref 0.2–1.0)

## 2016-01-31 MED ORDER — BCG LIVE 50 MG IS SUSR
3.2400 mL | Freq: Once | INTRAVESICAL | Status: AC
  Administered 2016-01-31: 81 mg via INTRAVESICAL

## 2016-01-31 MED ORDER — SODIUM CHLORIDE 0.9 % IJ SOLN
50.0000 mL | Freq: Once | INTRAMUSCULAR | Status: AC
  Administered 2016-01-31: 50 mL

## 2016-01-31 MED ORDER — LIDOCAINE HCL 2 % EX GEL
1.0000 "application " | Freq: Once | CUTANEOUS | Status: AC
  Administered 2016-01-31: 1 via URETHRAL

## 2016-01-31 NOTE — Progress Notes (Signed)
9:31 AM   Adriana Anderson 03/08/1941 [id number]  Referring provider: Madelyn Brunner, MD [facility] [place], [place] [address]  Chief Complaint  Patient presents with  . 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 CT Urogram negative in 2015.   She returned to the OR on 12/15 for repeat TURBT which was negative.  She is s/p bladder biopsy on 11/15/15 for suspicious erythematous lesion adjacent to her previous resection scar concerning for possible CIS. Unfortunately, pathology confirms this diagnosis.   She does have a strong family history of bladder cancer (siblings).    She did undergo a CT scan of the abdomen and pelvis with contrast in February 2016 with Dr. Pat Patrick for abdominal pain and elevated white blood cell count.  Found to have appendicitis and her appendix was removed.  Today, she feels well.  She has not had any recent cough, dysuria, gross hematuria or suprapubic pain.  She  also has not had any recent chills, nausea or vomiting.  She did have gross hematuria after her last installment for one void.  She then has two days of malaise.  After those two days, she feels like she can "hang the moon."  She has not been experiencing night sweats or a new cough.  She does experience dysuria the day of the treatment, but it quickly abates.    Her baseline urinary symptomatolgy is frequency, urgency, nocturia and leakage.    PMH: Past Medical History  Diagnosis Date  . Bladder mass   . Acid reflux   . Bladder cancer ([place])   . GERD (gastroesophageal reflux disease)   . Liver disease   . Diabetes mellitus, type 2 ([place])   . History of cystoscopy   . History of biopsy of bladder   . PONV  (postoperative nausea and vomiting)     Surgical History: Past Surgical History  Procedure Laterality Date  . Appendectomy    . Toe surgery    . Cystoscopy with biopsy N/A 11/15/2015    Procedure: CYSTOSCOPY WITH BIOPSY;  Surgeon: [name], MD;  Location: [facility] ORS;  Service: Urology;  Laterality: N/A;    Home Medications:    Medication List       This list is accurate as of: 01/31/16  9:31 AM.  Always use your most recent med list.               CALTRATE 600+D 600-400 MG-UNIT chew tablet  Generic drug:  Calcium Carbonate-Vitamin D  Chew 1 tablet by mouth 2 (two) times daily.     CLARITIN PO  Take 1 tablet by mouth daily as needed.     DEXILANT 60 MG capsule  Generic drug:  dexlansoprazole  Take 60 mg by mouth daily.     ESTRACE VAGINAL 0.1 MG/GM vaginal cream  Generic drug:  estradiol  USE 1 APPLICATION VAGINALLY ONCE A WEEK     PROBIOTIC DAILY PO  Take 1 capsule by mouth daily.     solifenacin 10 MG tablet  Commonly known as:  VESICARE  Take 1 tablet (10 mg total) by mouth daily.  URIBEL 118 MG Caps  Take 1 capsule (118 mg total) by mouth QID.        Allergies:  Allergies  Allergen Reactions  . Tetanus Toxoids Swelling  . Tetanus-Diphth-Acell Pertussis Swelling  . Sulfa Antibiotics Rash    Family History: Family History  Problem Relation Age of Onset  . Bladder Cancer Brother     x 3   . Brain cancer Father   . Diabetes    . Kidney disease Father     Social History:  reports that she quit smoking about 33 years ago. She does not have any smokeless tobacco history on file. She reports that she drinks alcohol. She reports that she does not use illicit drugs.  ROS: UROLOGY Frequent Urination?: Yes Hard to postpone urination?: Yes Burning/pain with urination?: No Get up at night to urinate?: Yes Leakage of urine?: Yes Urine stream starts and stops?: No Trouble starting stream?: No Do you have to strain to urinate?: No Blood in  urine?: No Urinary tract infection?: No Sexually transmitted disease?: No Injury to kidneys or bladder?: No Painful intercourse?: No Weak stream?: No Currently pregnant?: No Vaginal bleeding?: No Last menstrual period?: n  Gastrointestinal Nausea?: No Vomiting?: No Indigestion/heartburn?: Yes Diarrhea?: No Constipation?: Yes  Constitutional Fever: No Night sweats?: No Weight loss?: No Fatigue?: No  Skin Skin rash/lesions?: No Itching?: No  Eyes Blurred vision?: No Double vision?: No  Ears/Nose/Throat Sore throat?: No Sinus problems?: No  Hematologic/Lymphatic Swollen glands?: No Easy bruising?: No  Cardiovascular Leg swelling?: No Chest pain?: No  Respiratory Cough?: Yes Shortness of breath?: No  Endocrine Excessive thirst?: No  Musculoskeletal Back pain?: Yes Joint pain?: No  Neurological Headaches?: No Dizziness?: No  Psychologic Depression?: No Anxiety?: No  Physical Exam: BP 130/74 mmHg  Pulse 86  Ht 5\' 5"  (1.651 m)  Wt 152 lb 6.4 oz (69.128 kg)  BMI 25.36 kg/m2  Constitutional: Well nourished. Alert and oriented, No acute distress. HEENT: [place] AT, moist mucus membranes. Trachea midline, no masses. Cardiovascular: No clubbing, cyanosis, or edema. Respiratory: Normal respiratory effort, no increased work of breathing. GI: Abdomen is soft, non tender, non distended, no abdominal masses.  Skin: No rashes, bruises or suspicious lesions. Lymph: No cervical or inguinal adenopathy. Neurologic: Grossly intact, no focal deficits, moving all 4 extremities. Psychiatric: Normal mood and affect.  Laboratory Data: Lab Results  Component Value Date   WBC 5.7 06/08/2012   HGB 11.8* 06/08/2012   HCT 32.6* 06/08/2012   MCV 95 06/08/2012   PLT 158 06/08/2012   Lab Results  Component Value Date   CREATININE 0.96 11/04/2015   Lab Results  Component Value Date   AST 35 08/20/2014   Lab Results  Component Value Date   ALT 55 08/20/2014    Urinalysis Results for orders placed or performed in visit on 01/31/16  Microscopic Examination  Result Value Ref Range   WBC, UA 0-5 0 -  5 /hpf   RBC, UA None seen 0 -  2 /hpf   Epithelial Cells (non renal) >10 (A) 0 - 10 /hpf   Bacteria, UA None seen None seen/Few  Urinalysis, Complete  Result Value Ref Range   Specific Gravity, UA 1.010 1.005 - 1.030   pH, UA 5.5 5.0 - 7.5   Color, UA Yellow Yellow   Appearance Ur Hazy (A) Clear   Leukocytes, UA Negative Negative   Protein, UA Negative Negative/Trace   Glucose, UA Negative Negative   Ketones, UA Negative Negative  RBC, UA Negative Negative   Bilirubin, UA Negative Negative   Urobilinogen, Ur 0.2 0.2 - 1.0 mg/dL   Nitrite, UA Negative Negative   Microscopic Examination See below:     Assessment & Plan:    1. CIS (carcinoma in situ of bladder):   Patient received her 6th  BCG today.  She tolerated the installment.  Return in August 2017 for her cystoscopy.    Patient is instructed to contact the office if she develops a high grade fever, chills, cough, night sweats or gross hematuria.  She will pour bleach in her toilet for the next 6 hours after she urinates.    - Urinalysis, Complete  2. Malignant neoplasm of urinary bladder, unspecified site [facility]):  H/o HgT1 prior to this dx.  Return for cystoscopy in August 2017.  Return for keep cysto appt. on 05/12/2016 at 9 AM.  These notes generated with voice recognition software. I apologize for typographical errors.  Zara Council, [facility] [address] [place], [place] [address] [phone]

## 2016-01-31 NOTE — Progress Notes (Signed)
BCG Bladder Instillation  BCG # 6  Due to Bladder Cancer patient is present today for a BCG treatment. Patient was cleaned and prepped in a sterile fashion with betadine and lidocaine 2% jelly was instilled into the urethra.  A 14FR catheter was inserted, urine return was noted 62ml, urine was clear in color.  11ml of reconstituted BCG was instilled into the bladder. The catheter was then removed. Patient tolerated well, no complications were noted  Preformed by: Zara Council PA-C and Lyndee Hensen CMA  Follow up/ Additional notes: 3 months for cystoscopy (already scheduled for August)

## 2016-05-12 ENCOUNTER — Other Ambulatory Visit: Payer: Medicare Other | Admitting: Urology

## 2016-05-16 ENCOUNTER — Ambulatory Visit (INDEPENDENT_AMBULATORY_CARE_PROVIDER_SITE_OTHER): Payer: Medicare Other | Admitting: Urology

## 2016-05-16 LAB — URINALYSIS, COMPLETE
Bilirubin, UA: NEGATIVE
Ketones, UA: NEGATIVE
Leukocytes, UA: NEGATIVE
NITRITE UA: NEGATIVE
PH UA: 5.5 (ref 5.0–7.5)
Protein, UA: NEGATIVE
RBC, UA: NEGATIVE
Specific Gravity, UA: <1.005 — ABNORMAL LOW (ref 1.005–1.030)
UUROB: 0.2 mg/dL (ref 0.2–1.0)

## 2016-05-16 LAB — MICROSCOPIC EXAMINATION
BACTERIA UA: NONE SEEN
Epithelial Cells (non renal): >10 /hpf — AB (ref 0–10)
RBC MICROSCOPIC, UA: NONE SEEN /HPF (ref 0–?)

## 2016-05-16 NOTE — Progress Notes (Deleted)
10:30 AM  05/16/16   Adriana Anderson 07/25/1941 [id number]  Referring provider: Madelyn Brunner, MD [facility] [place], [place] [address]  No chief complaint on file.   HPI: 75 year old female who returns today for cystoscopy following her induction course of BCG.  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  CT Urogram negative.  She returned to the OR on 12/15 for repeat TURBT which was negative.  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 She completed an induction course of BCG on 01/31/16.  She does have a strong family history of bladder cancer (siblings).  Doing well postop. No dysuria or gross hematuria.  PMH: Past Medical History:  Diagnosis Date  . Acid reflux   . Bladder cancer ([place])   . Bladder mass   . Diabetes mellitus, type 2 ([place])   . GERD (gastroesophageal reflux disease)   . History of biopsy of bladder   . History of cystoscopy   . Liver disease   . PONV (postoperative nausea and vomiting)     Surgical History: Past Surgical History:  Procedure Laterality Date  . APPENDECTOMY    . CYSTOSCOPY WITH BIOPSY N/A 11/15/2015   Procedure: CYSTOSCOPY WITH BIOPSY;  Surgeon: [name], MD;  Location: [facility] ORS;  Service: Urology;  Laterality: N/A;  . TOE SURGERY      Home Medications:    Medication List       Accurate as of 05/16/16 10:30 AM. Always use your most recent med list.          CALTRATE 600+D 600-400 MG-UNIT chew tablet Generic drug:  Calcium Carbonate-Vitamin D Chew 1 tablet by mouth 2 (two) times daily.   CLARITIN PO Take 1 tablet by mouth daily as needed.   DEXILANT 60 MG capsule Generic drug:  dexlansoprazole Take 60 mg by mouth daily.   ESTRACE VAGINAL 0.1 MG/GM vaginal cream Generic drug:  estradiol USE 1 APPLICATION VAGINALLY ONCE A WEEK   PROBIOTIC DAILY PO Take 1 capsule by mouth daily.   solifenacin 10 MG tablet Commonly known as:   VESICARE Take 1 tablet (10 mg total) by mouth daily.   URIBEL 118 MG Caps Take 1 capsule (118 mg total) by mouth QID.       Allergies:  Allergies  Allergen Reactions  . Tetanus Toxoids Swelling  . Tetanus-Diphth-Acell Pertussis Swelling  . Sulfa Antibiotics Rash    Family History: Family History  Problem Relation Age of Onset  . Bladder Cancer Brother     x 3   . Brain cancer Father   . Diabetes    . Kidney disease Father     Social History:  reports that she quit smoking about 34 years ago. She does not have any smokeless tobacco history on file. She reports that she drinks alcohol. She reports that she does not use drugs.   Physical Exam: There were no vitals taken for this visit.  Constitutional:  Alert and oriented, No acute distress. HEENT: [place] AT, moist mucus membranes.  Trachea midline, no masses. Cardiovascular: No clubbing, cyanosis, or edema.  Respiratory: Normal respiratory effort, no increased work of breathing.   Skin: No rashes, bruises or suspicious lesions. GU: Normal external genitalia, normal urethral meatus. Neurologic: Grossly intact, no focal deficits, moving all 4 extremities. Psychiatric: Normal mood and affect.  Laboratory Data: Lab Results  Component Value Date   WBC 5.7 06/08/2012   HGB  11.8 (L) 06/08/2012   HCT 32.6 (L) 06/08/2012   MCV 95 06/08/2012   PLT 158 06/08/2012    Lab Results  Component Value Date   CREATININE 0.96 11/04/2015    Urinalysis Results for orders placed or performed in visit on 01/31/16  Microscopic Examination  Result Value Ref Range   WBC, UA 0-5 0 - 5 /hpf   RBC, UA None seen 0 - 2 /hpf   Epithelial Cells (non renal) >10 (A) 0 - 10 /hpf   Bacteria, UA None seen None seen/Few  Urinalysis, Complete  Result Value Ref Range   Specific Gravity, UA 1.010 1.005 - 1.030   pH, UA 5.5 5.0 - 7.5   Color, UA Yellow Yellow   Appearance Ur Hazy (A) Clear   Leukocytes, UA Negative Negative   Protein, UA  Negative Negative/Trace   Glucose, UA Negative Negative   Ketones, UA Negative Negative   RBC, UA Negative Negative   Bilirubin, UA Negative Negative   Urobilinogen, Ur 0.2 0.2 - 1.0 mg/dL   Nitrite, UA Negative Negative   Microscopic Examination See below:     Pertinent Imaging: N/a   Cystoscopy Procedure Note  Patient identification was confirmed, informed consent was obtained, and patient was prepped using Betadine solution.  Lidocaine jelly was administered per urethral meatus.    Preoperative abx where received prior to procedure.    Procedure: - Flexible cystoscope introduced, without any difficulty.   - Thorough search of the bladder revealed:    normal urethral meatus    normal urothelium    no stones    no ulcers     no tumors    no urethral polyps    no trabeculation  - Ureteral orifices were normal in position and appearance.  Post-Procedure: - Patient tolerated the procedure well   Assessment & Plan:    1. CIS (carcinoma in situ of bladder) S/p induction BCG for CIS  Recommend continuation of maintenance BCG on a schedule of 3 months, 6 months, 12 months, 18 months, 24 months, 30 months, and 36 months.    2. History of bladder cancer H/o HgT1 prior to this dx  No Follow-up on file.  [name], MD  [facility] [address], [place], [place] [address] [phone]

## 2016-05-18 ENCOUNTER — Ambulatory Visit (INDEPENDENT_AMBULATORY_CARE_PROVIDER_SITE_OTHER): Payer: Medicare Other | Admitting: Urology

## 2016-05-18 VITALS — BP 146/77 | HR 88 | Ht 65.0 in | Wt 149.0 lb

## 2016-05-18 DIAGNOSIS — D090 Carcinoma in situ of bladder: Secondary | ICD-10-CM | POA: Diagnosis not present

## 2016-05-18 DIAGNOSIS — Z8551 Personal history of malignant neoplasm of bladder: Secondary | ICD-10-CM

## 2016-05-18 MED ORDER — LIDOCAINE HCL 2 % EX GEL
1.0000 "application " | Freq: Once | CUTANEOUS | Status: AC
  Administered 2016-05-18: 1 via URETHRAL

## 2016-05-18 MED ORDER — CIPROFLOXACIN HCL 500 MG PO TABS
500.0000 mg | ORAL_TABLET | Freq: Once | ORAL | Status: AC
  Administered 2016-05-18: 500 mg via ORAL

## 2016-05-18 NOTE — Progress Notes (Signed)
10:03 AM  05/18/16   Adriana Anderson [birth year] [id number]  Referring provider: Madelyn Brunner, MD [facility] [place], [place] [address]  Chief Complaint  Patient presents with  . Cysto    HPI: 75 year old female who returns today for cystoscopy following her induction course of BCG.  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  CT Urogram negative.  She returned to the OR on 12/15 for repeat TURBT which was negative.  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 She completed an induction course of BCG on 01/31/16.  She does have a strong family history of bladder cancer (siblings).  No dysuria or gross hematuria today.  Tolerated BCG well.    PMH: Past Medical History:  Diagnosis Date  . Acid reflux   . Bladder cancer ([place])   . Bladder mass   . Diabetes mellitus, type 2 ([place])   . GERD (gastroesophageal reflux disease)   . History of biopsy of bladder   . History of cystoscopy   . Liver disease   . PONV (postoperative nausea and vomiting)     Surgical History: Past Surgical History:  Procedure Laterality Date  . APPENDECTOMY    . CYSTOSCOPY WITH BIOPSY N/A 11/15/2015   Procedure: CYSTOSCOPY WITH BIOPSY;  Surgeon: [name], MD;  Location: [facility] ORS;  Service: Urology;  Laterality: N/A;  . TOE SURGERY      Home Medications:    Medication List       Accurate as of 05/18/16 10:03 AM. Always use your most recent med list.          CALTRATE 600+D 600-400 MG-UNIT chew tablet Generic drug:  Calcium Carbonate-Vitamin D Chew 1 tablet by mouth 2 (two) times daily.   CLARITIN PO Take 1 tablet by mouth daily as needed.   DEXILANT 60 MG capsule Generic drug:  dexlansoprazole Take 60 mg by mouth daily.   ESTRACE VAGINAL 0.1 MG/GM vaginal cream Generic drug:  estradiol USE 1 APPLICATION VAGINALLY ONCE A WEEK   PROBIOTIC DAILY PO Take 1 capsule by mouth daily.   solifenacin 10 MG  tablet Commonly known as:  VESICARE Take 1 tablet (10 mg total) by mouth daily.   URIBEL 118 MG Caps Take 1 capsule (118 mg total) by mouth QID.       Allergies:  Allergies  Allergen Reactions  . Tetanus Toxoids Swelling  . Tetanus-Diphth-Acell Pertussis Swelling  . Sulfa Antibiotics Rash    Family History: Family History  Problem Relation Age of Onset  . Bladder Cancer Brother     x 3   . Brain cancer Father   . Diabetes    . Kidney disease Father     Social History:  reports that she quit smoking about 34 years ago. She does not have any smokeless tobacco history on file. She reports that she drinks alcohol. She reports that she does not use drugs.   Physical Exam: BP (!) 146/77   Pulse 88   Ht 5\' 5"  (1.651 m)   Wt 149 lb (67.6 kg)   BMI 24.79 kg/m   Constitutional:  Alert and oriented, No acute distress. HEENT: [place] AT, moist mucus membranes.  Trachea midline, no masses. Cardiovascular: No clubbing, cyanosis, or edema.  Respiratory: Normal respiratory effort, no increased work of breathing.   Skin: No rashes, bruises or suspicious lesions. GU: Normal external genitalia, normal urethral meatus. Neurologic: Grossly intact, no  focal deficits, moving all 4 extremities. Psychiatric: Normal mood and affect.  Laboratory Data: Lab Results  Component Value Date   WBC 5.7 06/08/2012   HGB 11.8 (L) 06/08/2012   HCT 32.6 (L) 06/08/2012   MCV 95 06/08/2012   PLT 158 06/08/2012    Lab Results  Component Value Date   CREATININE 0.96 11/04/2015    Urinalysis Results for orders placed or performed in visit on 05/16/16  Microscopic Examination  Result Value Ref Range   WBC, UA 0-5 0 - 5 /hpf   RBC, UA None seen 0 - 2 /hpf   Epithelial Cells (non renal) >10 (A) 0 - 10 /hpf   Bacteria, UA None seen None seen/Few  Urinalysis, Complete  Result Value Ref Range   Specific Gravity, UA <1.005 (L) 1.005 - 1.030   pH, UA 5.5 5.0 - 7.5   Color, UA Yellow Yellow    Appearance Ur Hazy (A) Clear   Leukocytes, UA Negative Negative   Protein, UA Negative Negative/Trace   Glucose, UA Trace (A) Negative   Ketones, UA Negative Negative   RBC, UA Negative Negative   Bilirubin, UA Negative Negative   Urobilinogen, Ur 0.2 0.2 - 1.0 mg/dL   Nitrite, UA Negative Negative   Microscopic Examination See below:     Pertinent Imaging: N/a   Cystoscopy Procedure Note  Patient identification was confirmed, informed consent was obtained, and patient was prepped using Betadine solution.  Lidocaine jelly was administered per urethral meatus.    Preoperative abx where received prior to procedure.    Procedure: - Flexible cystoscope introduced, without any difficulty.   - Thorough search of the bladder revealed:    normal urethral meatus    normal urothelium    no stones    no ulcers     no tumors    no urethral polyps    no trabeculation  - Ureteral orifices were normal in position and appearance.  Post-Procedure: - Patient tolerated the procedure well   Assessment & Plan:    1. CIS (carcinoma in situ of bladder) S/p induction BCG for CIS  Cysto today negative, cytology sent.    Recommend continuation of maintenance BCG on a schedule of 3 months, 6 months, 12 months, 18 months, 24 months, 30 months, and 36 months.    2. History of bladder cancer H/o HgT1 prior to this dx  Return for schedule BCG x 3 weeks (ASAP) then cysto in 3 months.  [name], MD  [facility] [address] [place], [place] [address] [phone]

## 2016-05-24 ENCOUNTER — Encounter: Payer: Self-pay | Admitting: Urology

## 2016-05-24 ENCOUNTER — Ambulatory Visit (INDEPENDENT_AMBULATORY_CARE_PROVIDER_SITE_OTHER): Payer: Medicare Other | Admitting: Urology

## 2016-05-24 VITALS — BP 120/71 | HR 84 | Ht 65.0 in | Wt 152.4 lb

## 2016-05-24 DIAGNOSIS — D090 Carcinoma in situ of bladder: Secondary | ICD-10-CM | POA: Diagnosis not present

## 2016-05-24 DIAGNOSIS — Z8551 Personal history of malignant neoplasm of bladder: Secondary | ICD-10-CM

## 2016-05-24 LAB — MICROSCOPIC EXAMINATION
Bacteria, UA: NONE SEEN
WBC, UA: NONE SEEN /hpf (ref 0–?)

## 2016-05-24 LAB — URINALYSIS, COMPLETE
Bilirubin, UA: NEGATIVE
GLUCOSE, UA: NEGATIVE
Ketones, UA: NEGATIVE
Leukocytes, UA: NEGATIVE
NITRITE UA: NEGATIVE
PH UA: 5.5 (ref 5.0–7.5)
Protein, UA: NEGATIVE
RBC, UA: NEGATIVE
Specific Gravity, UA: <1.005 — ABNORMAL LOW (ref 1.005–1.030)
UUROB: 0.2 mg/dL (ref 0.2–1.0)

## 2016-05-24 MED ORDER — LIDOCAINE HCL 2 % EX GEL
1.0000 "application " | Freq: Once | CUTANEOUS | Status: AC
  Administered 2016-05-24: 1 via URETHRAL

## 2016-05-24 MED ORDER — BCG LIVE 50 MG IS SUSR
3.2400 mL | Freq: Once | INTRAVESICAL | Status: DC
  Administered 2016-05-24: 81 mg via INTRAVESICAL

## 2016-05-24 NOTE — Progress Notes (Signed)
BCG Bladder Instillation  BCG # 1 of 3 Maint  Due to Bladder Cancer patient is present today for a BCG treatment. Patient was cleaned and prepped in a sterile fashion with betadine and lidocaine 2% jelly was instilled into the urethra.  A 14FR catheter was inserted, urine return was noted 138ml, urine was yellow in color.  24ml of reconstituted BCG was instilled into the bladder. The catheter was then removed. Patient tolerated well, no complications were noted  Preformed by: Zara Council PA-C and Lyndee Hensen CMA  Follow up/ Additional notes: One week

## 2016-05-24 NOTE — Progress Notes (Signed)
1:57 PM  05/24/16   SHAMYIAH KAEO 1941-03-02 [id number]  Referring provider: Madelyn Brunner, MD [facility] [place], [place] [address]  Chief Complaint  Patient presents with  . Follow-up    BCG    HPI: 75 year old female who returns today for maintenance course of BCG, this will be # 1 out of 3.    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  CT Urogram negative.  She returned to the OR on 12/15 for repeat TURBT which was negative.  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 She completed an induction course of BCG on 01/31/16.  She does have a strong family history of bladder cancer (siblings).  No dysuria or gross hematuria today.  Tolerated BCG well in the past.   She is experiencing urinary frequency, urgency, nocturia and incontinence, but these are baseline symptoms.   She specifically denies dysuria, gross hematuria or suprapubic pain. She has not had recent fevers, chills, nausea or vomiting. She does not have a cough. Her UA today is unremarkable.  PMH: Past Medical History:  Diagnosis Date  . Acid reflux   . Bladder cancer ([place])   . Bladder mass   . Diabetes mellitus, type 2 ([place])   . GERD (gastroesophageal reflux disease)   . History of biopsy of bladder   . History of cystoscopy   . Liver disease   . PONV (postoperative nausea and vomiting)     Surgical History: Past Surgical History:  Procedure Laterality Date  . APPENDECTOMY    . CYSTOSCOPY WITH BIOPSY N/A 11/15/2015   Procedure: CYSTOSCOPY WITH BIOPSY;  Surgeon: [name], MD;  Location: [facility] ORS;  Service: Urology;  Laterality: N/A;  . TOE SURGERY      Home Medications:    Medication List       Accurate as of 05/24/16  1:57 PM. Always use your most recent med list.          CALTRATE 600+D 600-400 MG-UNIT chew tablet Generic drug:  Calcium Carbonate-Vitamin D Chew 1 tablet by mouth 2 (two) times  daily.   CLARITIN PO Take 1 tablet by mouth daily as needed.   DEXILANT 60 MG capsule Generic drug:  dexlansoprazole Take 60 mg by mouth daily.   ESTRACE VAGINAL 0.1 MG/GM vaginal cream Generic drug:  estradiol USE 1 APPLICATION VAGINALLY ONCE A WEEK   PROBIOTIC DAILY PO Take 1 capsule by mouth daily.   solifenacin 10 MG tablet Commonly known as:  VESICARE Take 1 tablet (10 mg total) by mouth daily.   URIBEL 118 MG Caps Take 1 capsule (118 mg total) by mouth QID.       Allergies:  Allergies  Allergen Reactions  . Tetanus Toxoids Swelling  . Tetanus-Diphth-Acell Pertussis Swelling  . Sulfa Antibiotics Rash    Family History: Family History  Problem Relation Age of Onset  . Bladder Cancer Brother     x 3   . Brain cancer Father   . Diabetes    . Kidney disease Father     Social History:  reports that she quit smoking about 34 years ago. She has never used smokeless tobacco. She reports that she drinks alcohol. She reports that she does not use drugs.   Physical Exam: BP 120/71 (BP Location: Left Arm, Patient Position: Sitting, Cuff Size: Normal)   Pulse 84   Ht 5\' 5"  (1.651 m)  Wt 152 lb 6.4 oz (69.1 kg)   BMI 25.36 kg/m   Constitutional:  Alert and oriented, No acute distress. HEENT: [place] AT, moist mucus membranes.  Trachea midline, no masses. Cardiovascular: No clubbing, cyanosis, or edema.  Respiratory: Normal respiratory effort, no increased work of breathing.   Skin: No rashes, bruises or suspicious lesions. GU: Normal external genitalia, normal urethral meatus. Neurologic: Grossly intact, no focal deficits, moving all 4 extremities. Psychiatric: Normal mood and affect.  Laboratory Data: Lab Results  Component Value Date   WBC 5.7 06/08/2012   HGB 11.8 (L) 06/08/2012   HCT 32.6 (L) 06/08/2012   MCV 95 06/08/2012   PLT 158 06/08/2012    Lab Results  Component Value Date   CREATININE 0.96 11/04/2015    Urinalysis Results for orders placed  or performed in visit on 05/24/16  Microscopic Examination  Result Value Ref Range   WBC, UA None seen 0 - 5 /hpf   RBC, UA 0-2 0 - 2 /hpf   Epithelial Cells (non renal) 0-10 0 - 10 /hpf   Bacteria, UA None seen None seen/Few  Urinalysis, Complete  Result Value Ref Range   Specific Gravity, UA <1.005 (L) 1.005 - 1.030   pH, UA 5.5 5.0 - 7.5   Color, UA Yellow Yellow   Appearance Ur Clear Clear   Leukocytes, UA Negative Negative   Protein, UA Negative Negative/Trace   Glucose, UA Negative Negative   Ketones, UA Negative Negative   RBC, UA Negative Negative   Bilirubin, UA Negative Negative   Urobilinogen, Ur 0.2 0.2 - 1.0 mg/dL   Nitrite, UA Negative Negative   Microscopic Examination See below:      Assessment & Plan:    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   - # 1 of 3 BCG given today             - patient instructed to do rotation at home             - instructed to pour bleach in the toilet after urination for 6 hours of the day of treatment             - advised to contact our office or seek treatment in the ED if becomes febrile, chills, vomiting, night sweats/new cough in order to arrange for emergent/urgent intervention             - RTC in one week for # 2/3  BCG             - cystoscopy in 3 months after completion of BCG   2. History of bladder cancer H/o HgT1 prior to this dx  Return in about 1 week (around 05/31/2016) for # 2 BCG.  Zara Council, [facility] [address] [place], [place] [address] [phone]

## 2016-05-26 ENCOUNTER — Other Ambulatory Visit: Payer: Self-pay | Admitting: Urology

## 2016-06-02 ENCOUNTER — Ambulatory Visit (INDEPENDENT_AMBULATORY_CARE_PROVIDER_SITE_OTHER): Payer: Medicare Other | Admitting: Urology

## 2016-06-02 ENCOUNTER — Encounter: Payer: Self-pay | Admitting: Urology

## 2016-06-02 VITALS — BP 117/70 | HR 81 | Ht 65.0 in | Wt 150.5 lb

## 2016-06-02 DIAGNOSIS — D090 Carcinoma in situ of bladder: Secondary | ICD-10-CM | POA: Diagnosis not present

## 2016-06-02 LAB — URINALYSIS, COMPLETE
BILIRUBIN UA: NEGATIVE
GLUCOSE, UA: NEGATIVE
KETONES UA: NEGATIVE
Leukocytes, UA: NEGATIVE
NITRITE UA: NEGATIVE
Protein, UA: NEGATIVE
RBC UA: NEGATIVE
Specific Gravity, UA: <1.005 — ABNORMAL LOW (ref 1.005–1.030)
UUROB: 0.2 mg/dL (ref 0.2–1.0)
pH, UA: 6 (ref 5.0–7.5)

## 2016-06-02 LAB — MICROSCOPIC EXAMINATION: Bacteria, UA: NONE SEEN

## 2016-06-02 MED ORDER — LIDOCAINE HCL 2 % EX GEL
1.0000 "application " | Freq: Once | CUTANEOUS | Status: AC
  Administered 2016-06-02: 1 via URETHRAL

## 2016-06-02 MED ORDER — BCG LIVE 50 MG IS SUSR
3.2400 mL | Freq: Once | INTRAVESICAL | Status: AC
  Administered 2016-06-02: 81 mg via INTRAVESICAL

## 2016-06-02 NOTE — Progress Notes (Signed)
BCG Bladder Instillation  BCG # 2 of 3 Maint  Due to Bladder Cancer patient is present today for a BCG treatment. Patient was cleaned and prepped in a sterile fashion with betadine and lidocaine 2% jelly was instilled into the urethra.  A 14FR catheter was inserted, urine return was noted 123ml, urine was yellow in color.  34ml of reconstituted BCG was instilled into the bladder. The catheter was then removed. Patient tolerated well, no complications were noted  Preformed by: Lyndee Hensen CMA and Elberta Leatherwood CMA  Follow up/ Additional notes: One week

## 2016-06-06 NOTE — Progress Notes (Signed)
06/02/16  CC: History of bladder cancer  HPI: 75 yo F here for maintenance BCG 2/3.  UA reviewed.  No urinary complaints today.    Blood pressure 117/70, pulse 81, height 5\' 5"  (1.651 m), weight 150 lb 8 oz (68.3 kg).  Results for orders placed or performed in visit on 06/02/16  Microscopic Examination  Result Value Ref Range   WBC, UA 0-5 0 - 5 /hpf   RBC, UA 0-2 0 - 2 /hpf   Epithelial Cells (non renal) 0-10 0 - 10 /hpf   Bacteria, UA None seen None seen/Few  Urinalysis, Complete  Result Value Ref Range   Specific Gravity, UA <1.005 (L) 1.005 - 1.030   pH, UA 6.0 5.0 - 7.5   Color, UA Yellow Yellow   Appearance Ur Clear Clear   Leukocytes, UA Negative Negative   Protein, UA Negative Negative/Trace   Glucose, UA Negative Negative   Ketones, UA Negative Negative   RBC, UA Negative Negative   Bilirubin, UA Negative Negative   Urobilinogen, Ur 0.2 0.2 - 1.0 mg/dL   Nitrite, UA Negative Negative   Microscopic Examination See below:    See attached note for procedure  RTC in 1 week for next instillation  [name], MD

## 2016-06-09 ENCOUNTER — Encounter: Payer: Self-pay | Admitting: Urology

## 2016-06-09 ENCOUNTER — Ambulatory Visit (INDEPENDENT_AMBULATORY_CARE_PROVIDER_SITE_OTHER): Payer: Medicare Other | Admitting: Urology

## 2016-06-09 VITALS — BP 155/79 | HR 92 | Ht 65.0 in | Wt 150.4 lb

## 2016-06-09 DIAGNOSIS — D090 Carcinoma in situ of bladder: Secondary | ICD-10-CM

## 2016-06-09 LAB — URINALYSIS, COMPLETE
BILIRUBIN UA: NEGATIVE
GLUCOSE, UA: NEGATIVE
KETONES UA: NEGATIVE
LEUKOCYTES UA: NEGATIVE
NITRITE UA: NEGATIVE
Protein, UA: NEGATIVE
RBC, UA: NEGATIVE
SPEC GRAV UA: 1.01 (ref 1.005–1.030)
UUROB: 0.2 mg/dL (ref 0.2–1.0)
pH, UA: 5.5 (ref 5.0–7.5)

## 2016-06-09 LAB — MICROSCOPIC EXAMINATION
Bacteria, UA: NONE SEEN
Epithelial Cells (non renal): >10 /hpf — AB (ref 0–10)

## 2016-06-09 MED ORDER — BCG LIVE 50 MG IS SUSR
3.2400 mL | Freq: Once | INTRAVESICAL | Status: AC
  Administered 2016-06-09: 81 mg via INTRAVESICAL

## 2016-06-09 NOTE — Progress Notes (Signed)
BCG Bladder Instillation  BCG # 3 maintenance  Due to Bladder Cancer patient is present today for a BCG treatment. Patient was cleaned and prepped in a sterile fashion with betadine and lidocaine.  A 14FR catheter was inserted, urine return was noted 66ml, urine was yellow in color.  73ml of reconstituted BCG was instilled into the bladder. The catheter was then removed. Patient tolerated well, no complications were noted  Preformed by: Elberta Leatherwood, CMA, [name], MD  Follow up/ Additional notes: 6 weeks for Cysto

## 2016-06-09 NOTE — Progress Notes (Signed)
06/09/16  CC: History of bladder cancer  HPI: 75 yo F here for maintenance BCG 3/3.  UA reviewed.  No urinary complaints today.  Reports low-grade temp, isolated, following last treatment. Otherwise, no issues.  Blood pressure 117/70, pulse 81, height 5\' 5"  (1.651 m), weight 150 lb 8 oz (68.3 kg).  UA reviewed, negative, see EPIC  See attached note for procedure  RTC in 3 months for next surveillance cystoscopy  [name], MD

## 2016-08-07 ENCOUNTER — Other Ambulatory Visit: Payer: Self-pay | Admitting: Internal Medicine

## 2016-08-07 DIAGNOSIS — Z1231 Encounter for screening mammogram for malignant neoplasm of breast: Secondary | ICD-10-CM

## 2016-08-08 ENCOUNTER — Other Ambulatory Visit: Payer: Self-pay

## 2016-08-08 DIAGNOSIS — N3281 Overactive bladder: Secondary | ICD-10-CM

## 2016-08-08 MED ORDER — SOLIFENACIN SUCCINATE 10 MG PO TABS: 10.0000 mg | ORAL_TABLET | Freq: Every day | ORAL | 2 refills | Status: DC

## 2016-08-11 ENCOUNTER — Other Ambulatory Visit: Payer: Medicare Other | Admitting: Urology

## 2016-09-08 ENCOUNTER — Other Ambulatory Visit: Payer: Medicare Other | Admitting: Urology

## 2016-09-11 ENCOUNTER — Ambulatory Visit
Admission: RE | Admit: 2016-09-11 | Discharge: 2016-09-11 | Disposition: A | Discharge status: Home / Self Care | Payer: Medicare Other | Source: Ambulatory Visit | Attending: Internal Medicine | Admitting: Internal Medicine

## 2016-09-11 DIAGNOSIS — Z1231 Encounter for screening mammogram for malignant neoplasm of breast: Secondary | ICD-10-CM | POA: Diagnosis not present

## 2016-09-12 ENCOUNTER — Ambulatory Visit: Payer: Medicare Other | Admitting: Urology

## 2016-09-12 VITALS — BP 145/82 | HR 88 | Ht 65.0 in | Wt 153.8 lb

## 2016-09-12 DIAGNOSIS — D090 Carcinoma in situ of bladder: Secondary | ICD-10-CM

## 2016-09-12 DIAGNOSIS — C678 Malignant neoplasm of overlapping sites of bladder: Secondary | ICD-10-CM | POA: Diagnosis not present

## 2016-09-12 LAB — MICROSCOPIC EXAMINATION
Epithelial Cells (non renal): >10 /hpf — AB (ref 0–10)
WBC, UA: >30 /hpf — AB (ref 0–?)

## 2016-09-12 LAB — URINALYSIS, COMPLETE
Bilirubin, UA: NEGATIVE
GLUCOSE, UA: NEGATIVE
KETONES UA: NEGATIVE
Nitrite, UA: POSITIVE — AB
Protein, UA: NEGATIVE
Urobilinogen, Ur: 0.2 mg/dL (ref 0.2–1.0)
pH, UA: 5.5 (ref 5.0–7.5)

## 2016-09-12 MED ORDER — LIDOCAINE HCL 2 % EX GEL
1.0000 "application " | Freq: Once | CUTANEOUS | Status: AC
  Administered 2016-09-12: 1 via URETHRAL

## 2016-09-12 MED ORDER — CIPROFLOXACIN HCL 500 MG PO TABS
500.0000 mg | ORAL_TABLET | Freq: Once | ORAL | Status: AC
  Administered 2016-09-12: 500 mg via ORAL

## 2016-09-12 MED ORDER — BCG LIVE 50 MG IS SUSR
3.2400 mL | Freq: Once | INTRAVESICAL | Status: AC
  Administered 2016-09-12: 81 mg via INTRAVESICAL

## 2016-09-12 NOTE — Progress Notes (Signed)
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    PMH: Past Medical History:  Diagnosis Date  . Acid reflux   . Bladder cancer ([place])   . Bladder mass   . Diabetes mellitus, type 2 ([place])   . GERD (gastroesophageal reflux disease)   . History of biopsy of bladder   . History of cystoscopy   . Liver disease   . PONV (postoperative nausea and vomiting)     Surgical History: Past Surgical History:  Procedure Laterality Date  . APPENDECTOMY    . CYSTOSCOPY WITH BIOPSY N/A 11/15/2015   Procedure: CYSTOSCOPY WITH BIOPSY;  Surgeon: [name], MD;  Location: [facility] ORS;  Service: Urology;  Laterality: N/A;  . TOE SURGERY      Home Medications:    Medication List       Accurate as of 09/12/16  5:59 AM. Always use your most recent med list.          CALTRATE 600+D 600-400 MG-UNIT chew tablet Generic drug:  Calcium Carbonate-Vitamin D Chew 1 tablet by mouth 2 (two) times daily.   CLARITIN PO Take 1 tablet by mouth daily as needed.   DEXILANT 60 MG capsule Generic drug:  dexlansoprazole Take 60 mg by mouth daily.   ESTRACE VAGINAL 0.1 MG/GM vaginal cream Generic drug:  estradiol USE 1 APPLICATION VAGINALLY ONCE A WEEK   MIRALAX PO Take by mouth.   PROBIOTIC DAILY PO Take 1 capsule by mouth daily.   solifenacin 10 MG tablet Commonly known as:  VESICARE Take 1 tablet (10  mg total) by mouth daily.   URIBEL 118 MG Caps Take 1 capsule (118 mg total) by mouth QID.       Allergies:  Allergies  Allergen Reactions  . Tetanus Toxoids Swelling  . Tetanus-Diphth-Acell Pertussis Swelling  . Sulfa Antibiotics Rash    Family History: Family History  Problem Relation Age of Onset  . Bladder Cancer Brother     x 3   . Brain cancer Father   . Kidney disease Father   . Diabetes      Social History:  reports that she quit smoking about 34 years ago. She has never used smokeless tobacco. She reports that she drinks alcohol. She reports that she does not use drugs.    Review of Systems  Gastrointestinal (upper)  : Negative for upper GI symptoms  Gastrointestinal (lower) : Negative for lower GI symptoms  Constitutional : Negative for symptoms  Skin: Negative for skin symptoms  Eyes: Negative for eye symptoms  Ear/Nose/Throat : Negative for Ear/Nose/Throat symptoms  Hematologic/Lymphatic: Negative for Hematologic/Lymphatic symptoms  Cardiovascular : Negative for cardiovascular symptoms  Respiratory : Negative for respiratory symptoms  Endocrine: Negative for endocrine symptoms  Musculoskeletal: Negative for musculoskeletal symptoms  Neurological: Negative for neurological symptoms  Psychologic: Negative for psychiatric symptoms    Physical  Exam: There were no vitals taken for this visit.  Constitutional:  Alert and oriented, No acute distress. HEENT: [place] AT, moist mucus membranes.  Trachea midline, no masses. Cardiovascular: No clubbing, cyanosis, or edema. Respiratory: Normal respiratory effort, no increased work of breathing. GI: Abdomen is soft, nontender, nondistended, no abdominal masses GU: No CVA tenderness.  Skin: No rashes, bruises or suspicious lesions. Lymph: No cervical or inguinal adenopathy. Neurologic: Grossly intact, no focal deficits, moving all 4 extremities. Psychiatric: Normal mood and  affect.  Laboratory Data: Lab Results  Component Value Date   WBC 5.7 06/08/2012   HGB 11.8 (L) 06/08/2012   HCT 32.6 (L) 06/08/2012   MCV 95 06/08/2012   PLT 158 06/08/2012    Lab Results  Component Value Date   CREATININE 0.96 11/04/2015    No results found for: PSA  No results found for: TESTOSTERONE  No results found for: HGBA1C  Urinalysis    Component Value Date/Time   COLORURINE Amber 06/06/2012 1054   APPEARANCEUR Clear 06/09/2016 0934   LABSPEC 1.014 06/06/2012 1054   PHURINE 5.0 06/06/2012 1054   GLUCOSEU Negative 06/09/2016 0934   GLUCOSEU Negative 06/06/2012 1054   HGBUR 1+ 06/06/2012 1054   BILIRUBINUR Negative 06/09/2016 0934   BILIRUBINUR Negative 06/06/2012 1054   KETONESUR Trace 06/06/2012 1054   PROTEINUR Negative 06/09/2016 0934   PROTEINUR 30 mg/dL 06/06/2012 1054   NITRITE Negative 06/09/2016 0934   NITRITE Negative 06/06/2012 1054   LEUKOCYTESUR Negative 06/09/2016 0934   LEUKOCYTESUR 3+ 06/06/2012 1054     09/12/16  CC:  Chief Complaint  Patient presents with  . Cysto    bladder cancer     HPI:  Blood pressure (!) 145/82, pulse 88, height 5\' 5"  (1.651 m), weight 69.8 kg (153 lb 12.8 oz). NED. A&Ox3.   No respiratory distress   Abd soft, NT, ND Normal external genitalia with patent urethral meatus  Cystoscopy Procedure Note  Patient identification was confirmed, informed consent was obtained, and patient was prepped using Betadine solution.  Lidocaine jelly was administered per urethral meatus.    Preoperative abx where received prior to procedure.    Procedure: - Flexible cystoscope introduced, without any difficulty.   - Thorough search of the bladder revealed:    normal urethral meatus    normal urothelium    no stones    no ulcers     no tumors    no urethral polyps    no trabeculation  - Ureteral orifices were normal in position and appearance.  Post-Procedure: - Patient tolerated the procedure well - Adriana Anderson  as [place], no recurrence.      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 Agree with maintenance BCG given recurrence pattern, but she will need cystectomy if recurs again.   RTC qweek x 2 more for maintenance BCG nurse visit, then 35mos for surveillance cysto.   Alexis Frock, [facility] [address], [place], [place] [address] [phone]

## 2016-09-12 NOTE — Progress Notes (Signed)
BCG Bladder Instillation  BCG # 1 maintanance  Due to Bladder Cancer patient is present today for a BCG treatment. Patient was cleaned and prepped in a sterile fashion with betadine and lidocaine 2% jelly was instilled into the urethra.  A 16 FR catheter was inserted, urine return was noted 30 ml, urine was  in color.  49ml of reconstituted BCG was instilled into the bladder. The catheter was then removed. Patient tolerated well, no complications were noted  Preformed by: [place]  Follow up/ Additional notes: f/u for next BCG maintanance

## 2016-09-20 ENCOUNTER — Encounter: Payer: Self-pay | Admitting: Urology

## 2016-09-20 ENCOUNTER — Ambulatory Visit: Payer: Medicare Other | Admitting: Urology

## 2016-09-20 VITALS — BP 135/85 | HR 99 | Ht 65.0 in | Wt 154.0 lb

## 2016-09-20 DIAGNOSIS — C679 Malignant neoplasm of bladder, unspecified: Secondary | ICD-10-CM | POA: Diagnosis not present

## 2016-09-20 DIAGNOSIS — D090 Carcinoma in situ of bladder: Secondary | ICD-10-CM

## 2016-09-20 LAB — MICROSCOPIC EXAMINATION: RBC MICROSCOPIC, UA: NONE SEEN /HPF (ref 0–?)

## 2016-09-20 LAB — URINALYSIS, COMPLETE
BILIRUBIN UA: NEGATIVE
GLUCOSE, UA: NEGATIVE
KETONES UA: NEGATIVE
NITRITE UA: NEGATIVE
Protein, UA: NEGATIVE
RBC, UA: NEGATIVE
UUROB: 0.2 mg/dL (ref 0.2–1.0)
pH, UA: 5.5 (ref 5.0–7.5)

## 2016-09-20 MED ORDER — LIDOCAINE HCL 2 % EX GEL
1.0000 "application " | Freq: Once | CUTANEOUS | Status: AC
  Administered 2016-09-20: 1 via URETHRAL

## 2016-09-20 MED ORDER — BCG LIVE 50 MG IS SUSR
3.2400 mL | Freq: Once | INTRAVESICAL | Status: AC
  Administered 2016-09-20: 81 mg via INTRAVESICAL

## 2016-09-20 NOTE — Progress Notes (Signed)
BCG Bladder Instillation  BCG # 2  Due to Bladder Cancer patient is present today for a BCG treatment. Patient was cleaned and prepped in a sterile fashion with betadine and lidocaine 2% jelly was instilled into the urethra.  A 14FR catheter was inserted, urine return was noted 121ml, urine was yellow in color.  31ml of reconstituted BCG was instilled into the bladder. The catheter was then removed. Patient tolerated well, no complications were noted  Preformed by: Zara Council PA-C and Lyndee Hensen CMA  Follow up/ Additional notes: One week

## 2016-09-20 NOTE — Progress Notes (Signed)
09/20/2016 2:59 PM   Adriana Anderson 1940/10/08 [id number]  Referring provider: Madelyn Brunner, MD [facility] [place], [place] [address]  Chief Complaint  Patient presents with  . Bladder Cancer    BCG  2     HPI: Patient is a 75 year old Caucasian female who presents today for her 2 of 3 maintenance BCG.    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  She was scheduled for  x 2 more for maintenance BCG nurse visit, then 65mos for surveillance cysto.  Last week, she states she was able to hold the instillation for two hours.    Today, she states she is having frequency, urgency, nocturia and incontinence.  She denies any gross hematuria.  She has not had recent fevers, chills, nausea or vomiting.  She has not had a new cough.  Her UA today noted 11-30 WBC's.    PMH: Past Medical History:  Diagnosis Date  . Acid reflux   . Bladder cancer ([place])   . Bladder mass   . Diabetes mellitus, type 2 ([place])   . GERD (gastroesophageal reflux disease)   . History of biopsy of bladder   . History of cystoscopy   . Liver disease   . PONV (postoperative nausea and vomiting)     Surgical History: Past Surgical History:  Procedure Laterality Date  . APPENDECTOMY    . CYSTOSCOPY WITH BIOPSY N/A 11/15/2015   Procedure: CYSTOSCOPY WITH BIOPSY;  Surgeon: [name], MD;  Location: [facility] ORS;  Service: Urology;  Laterality: N/A;  . TOE SURGERY      Home Medications:  Allergies as of 09/20/2016      Reactions   Tetanus Toxoids Swelling   Tetanus-diphth-acell  Pertussis Swelling   Sulfa Antibiotics Rash      Medication List       Accurate as of 09/20/16  2:59 PM. Always use your most recent med list.          CALTRATE 600+D 600-400 MG-UNIT chew tablet Generic drug:  Calcium Carbonate-Vitamin D Chew 1 tablet by mouth 2 (two) times daily.   CLARITIN PO Take 1 tablet by mouth daily as needed.   DEXILANT 60 MG capsule Generic drug:  dexlansoprazole Take 60 mg by mouth daily.   ESTRACE VAGINAL 0.1 MG/GM vaginal cream Generic drug:  estradiol USE 1 APPLICATION VAGINALLY ONCE A WEEK   MIRALAX PO Take by mouth.   PROBIOTIC DAILY PO Take 1 capsule by mouth daily.   solifenacin 10 MG tablet Commonly known as:  VESICARE Take 1 tablet (10 mg total) by mouth daily.   URIBEL 118 MG Caps Take 1 capsule (118 mg total) by mouth QID.       Allergies:  Allergies  Allergen Reactions  . Tetanus Toxoids Swelling  . Tetanus-Diphth-Acell Pertussis Swelling  . Sulfa Antibiotics Rash    Family History: Family History  Problem Relation Age of Onset  . Bladder Cancer Brother     x 3   . Brain cancer Father   . Kidney disease  Father   . Diabetes    . Prostate cancer Neg Hx     Social History:  reports that she quit smoking about 34 years ago. She has never used smokeless tobacco. She reports that she drinks alcohol. She reports that she does not use drugs.  ROS: UROLOGY Frequent Urination?: Yes Hard to postpone urination?: Yes Burning/pain with urination?: No Get up at night to urinate?: Yes Leakage of urine?: Yes Urine stream starts and stops?: No Trouble starting stream?: No Do you have to strain to urinate?: No Blood in urine?: No Urinary tract infection?: No Sexually transmitted disease?: No Injury to kidneys or bladder?: No Painful intercourse?: No Weak stream?: No Currently pregnant?: No Vaginal bleeding?: No Last menstrual period?: n  Gastrointestinal Nausea?: No Vomiting?: No Indigestion/heartburn?:  No Diarrhea?: No Constipation?: Yes  Constitutional Fever: No Night sweats?: No Weight loss?: No Fatigue?: No  Skin Skin rash/lesions?: No Itching?: No  Eyes Blurred vision?: No Double vision?: No  Ears/Nose/Throat Sore throat?: No Sinus problems?: No  Hematologic/Lymphatic Swollen glands?: No Easy bruising?: No  Cardiovascular Leg swelling?: No Chest pain?: No  Respiratory Cough?: Yes Shortness of breath?: No  Endocrine Excessive thirst?: No  Musculoskeletal Back pain?: Yes Joint pain?: No  Neurological Headaches?: No Dizziness?: No  Psychologic Depression?: No Anxiety?: No  Physical Exam: BP 135/85   Pulse 99   Ht 5\' 5"  (1.651 m)   Wt 154 lb (69.9 kg)   BMI 25.63 kg/m   Constitutional: Well nourished. Alert and oriented, No acute distress. HEENT: [place] AT, moist mucus membranes. Trachea midline, no masses. Cardiovascular: No clubbing, cyanosis, or edema. Respiratory: Normal respiratory effort, no increased work of breathing. GI: Abdomen is soft, non tender, non distended, no abdominal masses. Liver and spleen not palpable.  No hernias appreciated.  Stool sample for occult testing is not indicated.   GU: No CVA tenderness.  No bladder fullness or masses.   Skin: No rashes, bruises or suspicious lesions. Lymph: No cervical or inguinal adenopathy. Neurologic: Grossly intact, no focal deficits, moving all 4 extremities. Psychiatric: Normal mood and affect.  Laboratory Data: Lab Results  Component Value Date   WBC 5.7 06/08/2012   HGB 11.8 (L) 06/08/2012   HCT 32.6 (L) 06/08/2012   MCV 95 06/08/2012   PLT 158 06/08/2012    Lab Results  Component Value Date   CREATININE 0.96 11/04/2015    Lab Results  Component Value Date   AST 35 08/20/2014   Lab Results  Component Value Date   ALT 55 08/20/2014     Urinalysis 11-30 WBC's.  See EPIC.   Assessment & Plan:    1. Malignant neoplasm of urinary bladder, unspecified site  ([place])  - Reviewed BCG treatment course, possible side effects including BCG sepsis, bladder irritation, worsening of her urinary symptoms  - # 2 of 3 maintenance BCG installed today  - Patient was instructed to pour bleach down her toilet for the next 6 hours  -  Instructed to call the office if she should experience fevers greater than 102, chills/rigors, onset of a new cough, night sweats or further bladder spasms or inability to urinate   - RTC in one week for # 3 maintenance BCG  - Surveillance cystoscopy in 3 months   - Urinalysis, Complete  - bcg vaccine injection 81 mg; Instill 3.24 mLs (81 mg total) into the bladder once.  - lidocaine (XYLOCAINE) 2 % jelly 1 application; Place 1 application into the urethra once.  -  Urinalysis, Complete  - bcg vaccine injection 81 mg; Instill 3.24 mLs (81 mg total) into the bladder once.  - lidocaine (XYLOCAINE) 2 % jelly 1 application; Place 1 application into the urethra once.  2. CIS  - see above  - if patient has reoccurrence, cystectomy is recommended   Return in about 1 week (around 09/27/2016) for # 3 maintenance BCG.  These notes generated with voice recognition software. I apologize for typographical errors.  Zara Council, [facility] [address] [place], [place] [address] [phone]

## 2016-09-29 ENCOUNTER — Ambulatory Visit: Payer: Medicare Other

## 2016-10-04 ENCOUNTER — Encounter: Payer: Self-pay | Admitting: Urology

## 2016-10-04 ENCOUNTER — Ambulatory Visit: Payer: Medicare Other | Admitting: Urology

## 2016-10-04 VITALS — BP 134/73 | HR 88 | Ht 65.0 in | Wt 150.8 lb

## 2016-10-04 DIAGNOSIS — D090 Carcinoma in situ of bladder: Secondary | ICD-10-CM

## 2016-10-04 DIAGNOSIS — C679 Malignant neoplasm of bladder, unspecified: Secondary | ICD-10-CM

## 2016-10-04 LAB — URINALYSIS, COMPLETE
BILIRUBIN UA: NEGATIVE
Glucose, UA: NEGATIVE
KETONES UA: NEGATIVE
Nitrite, UA: POSITIVE — AB
PH UA: 6 (ref 5.0–7.5)
Protein, UA: NEGATIVE
SPEC GRAV UA: 1.015 (ref 1.005–1.030)
UUROB: 0.2 mg/dL (ref 0.2–1.0)

## 2016-10-04 LAB — MICROSCOPIC EXAMINATION: RBC, UA: NONE SEEN /hpf (ref 0–?)

## 2016-10-04 MED ORDER — BCG LIVE 50 MG IS SUSR
3.2400 mL | Freq: Once | INTRAVESICAL | Status: AC
  Administered 2016-10-04: 81 mg via INTRAVESICAL

## 2016-10-04 MED ORDER — LIDOCAINE HCL 2 % EX GEL
1.0000 "application " | Freq: Once | CUTANEOUS | Status: AC
  Administered 2016-10-04: 1 via URETHRAL

## 2016-10-04 NOTE — Progress Notes (Signed)
BCG Bladder Instillation  BCG # 3 of Maintenance 3  Due to Bladder Cancer patient is present today for a BCG treatment. Patient was cleaned and prepped in a sterile fashion with betadine and lidocaine 2% jelly was instilled into the urethra.  A 14FR catheter was inserted, urine return was noted 214ml, urine was yellow in color.  24ml of reconstituted BCG was instilled into the bladder. The catheter was then removed. Patient tolerated well, no complications were noted.  Preformed by: Zara Council PA-C and Lyndee Hensen CMA  Follow up/ Additional notes: Cystoscopy in April

## 2016-10-04 NOTE — Progress Notes (Signed)
10/04/2016 9:56 AM   Adriana Anderson [birth year] [id number]  Referring provider: Madelyn Brunner, MD [place] [facility] [place], [place] [address]  Chief Complaint  Patient presents with  . Bladder Cancer    HPI: Patient is a 76 year old Caucasian female who presents today for her 3 of 3 maintenance BCG.    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  She was scheduled for  x 2 more for maintenance BCG nurse visit, then 3 mos for surveillance cysto.  Last week, she states she was able to hold the instillation for two hours.    Today, she states she is having frequency, urgency, nocturia and incontinence.  She denies any gross hematuria.  She has not had recent fevers, chills, nausea or vomiting.  She has not had a new cough.  Her UA today noted > 30 WBC's and positive for nitrates.  This UA is similar to her first treatment's UA and she had no difficulty with that installment.    PMH: Past Medical History:  Diagnosis Date  . Acid reflux   . Bladder cancer ([place])   . Bladder mass   . Diabetes mellitus, type 2 ([place])   . GERD (gastroesophageal reflux disease)   . History of biopsy of bladder   . History of cystoscopy   . Liver disease   . PONV (postoperative nausea and vomiting)     Surgical History: Past Surgical History:  Procedure Laterality Date  . APPENDECTOMY    . CYSTOSCOPY WITH BIOPSY N/A 11/15/2015   Procedure: CYSTOSCOPY WITH BIOPSY;  Surgeon: [name], MD;  Location: [facility] ORS;  Service: Urology;  Laterality: N/A;  . TOE SURGERY      Home  Medications:  Allergies as of 10/04/2016      Reactions   Tetanus Toxoids Swelling   Tetanus-diphth-acell Pertussis Swelling   Sulfa Antibiotics Rash      Medication List       Accurate as of 10/04/16  9:56 AM. Always use your most recent med list.          CALTRATE 600+D 600-400 MG-UNIT chew tablet Generic drug:  Calcium Carbonate-Vitamin D Chew 1 tablet by mouth 2 (two) times daily.   CLARITIN PO Take 1 tablet by mouth daily as needed.   DEXILANT 60 MG capsule Generic drug:  dexlansoprazole Take 60 mg by mouth daily.   ESTRACE VAGINAL 0.1 MG/GM vaginal cream Generic drug:  estradiol USE 1 APPLICATION VAGINALLY ONCE A WEEK   MIRALAX PO Take by mouth.   PROBIOTIC DAILY PO Take 1 capsule by mouth daily.   solifenacin 10 MG tablet Commonly known as:  VESICARE Take 1 tablet (10 mg total) by mouth daily.   URIBEL 118 MG Caps Take 1 capsule (118 mg total) by mouth QID.       Allergies:  Allergies  Allergen Reactions  . Tetanus Toxoids Swelling  . Tetanus-Diphth-Acell Pertussis Swelling  . Sulfa Antibiotics Rash    Family History: Family History  Problem Relation Age of Onset  . Bladder Cancer Brother  x 3   . Brain cancer Father   . Kidney disease Father   . Diabetes    . Prostate cancer Neg Hx     Social History:  reports that she quit smoking about 34 years ago. She has never used smokeless tobacco. She reports that she drinks alcohol. She reports that she does not use drugs.  ROS: UROLOGY Frequent Urination?: Yes Hard to postpone urination?: Yes Burning/pain with urination?: No Get up at night to urinate?: Yes Leakage of urine?: Yes Urine stream starts and stops?: No Trouble starting stream?: No Do you have to strain to urinate?: No Blood in urine?: No Urinary tract infection?: No Sexually transmitted disease?: No Injury to kidneys or bladder?: No Painful intercourse?: No Weak stream?: No Currently pregnant?: No Vaginal bleeding?:  No Last menstrual period?: n  Gastrointestinal Nausea?: No Vomiting?: No Indigestion/heartburn?: Yes Diarrhea?: No Constipation?: Yes  Constitutional Fever: No Night sweats?: No Weight loss?: No Fatigue?: No  Skin Skin rash/lesions?: No Itching?: No  Eyes Blurred vision?: No Double vision?: No  Ears/Nose/Throat Sore throat?: No Sinus problems?: No  Hematologic/Lymphatic Swollen glands?: No Easy bruising?: Yes  Cardiovascular Leg swelling?: No Chest pain?: No  Respiratory Cough?: Yes Shortness of breath?: No  Endocrine Excessive thirst?: No  Musculoskeletal Back pain?: Yes Joint pain?: No  Neurological Headaches?: No Dizziness?: No  Psychologic Depression?: No Anxiety?: No  Physical Exam: BP 134/73 (BP Location: Left Arm, Patient Position: Sitting, Cuff Size: Normal)   Pulse 88   Ht 5\' 5"  (1.651 m)   Wt 150 lb 12.8 oz (68.4 kg)   BMI 25.09 kg/m   Constitutional: Well nourished. Alert and oriented, No acute distress. HEENT: [place] AT, moist mucus membranes. Trachea midline, no masses. Cardiovascular: No clubbing, cyanosis, or edema. Respiratory: Normal respiratory effort, no increased work of breathing. GI: Abdomen is soft, non tender, non distended, no abdominal masses. Liver and spleen not palpable.  No hernias appreciated.  Stool sample for occult testing is not indicated.   GU: No CVA tenderness.  No bladder fullness or masses.   Skin: No rashes, bruises or suspicious lesions. Lymph: No cervical or inguinal adenopathy. Neurologic: Grossly intact, no focal deficits, moving all 4 extremities. Psychiatric: Normal mood and affect.  Laboratory Data: Lab Results  Component Value Date   WBC 5.7 06/08/2012   HGB 11.8 (L) 06/08/2012   HCT 32.6 (L) 06/08/2012   MCV 95 06/08/2012   PLT 158 06/08/2012    Lab Results  Component Value Date   CREATININE 0.96 11/04/2015    Lab Results  Component Value Date   AST 35 08/20/2014   Lab Results   Component Value Date   ALT 55 08/20/2014     Urinalysis > 30 WBC's.  Nitrite positive.  See EPIC.   Assessment & Plan:    1. Malignant neoplasm of urinary bladder, unspecified site ([place])  - Reviewed BCG treatment course, possible side effects including BCG sepsis, bladder irritation, worsening of her urinary symptoms  - # 3 of 3 maintenance BCG installed today  - Patient was instructed to pour bleach down her toilet for the next 6 hours  -  Instructed to call the office if she should experience fevers greater than 102, chills/rigors, onset of a new cough, night sweats or further bladder spasms or inability to urinate   - RTC in one week for # 3 maintenance BCG  - Surveillance cystoscopy scheduled for 01/09/2017  - Urinalysis, Complete  - urine sent for culture  -  bcg vaccine injection 81 mg; Instill 3.24 mLs (81 mg total) into the bladder once.  - lidocaine (XYLOCAINE) 2 % jelly 1 application; Place 1 application into the urethra once.   2. CIS  - see above  - if patient has reoccurrence, cystectomy is recommended   Return for keep cysto appointment on 01/09/2017.  These notes generated with voice recognition software. I apologize for typographical errors.  Zara Council, [facility] [address], [place], [place] [address] [phone]

## 2016-10-08 LAB — CULTURE, URINE COMPREHENSIVE

## 2016-10-09 ENCOUNTER — Telehealth: Payer: Self-pay

## 2016-10-09 DIAGNOSIS — N390 Urinary tract infection, site not specified: Secondary | ICD-10-CM

## 2016-10-09 MED ORDER — AMOXICILLIN-POT CLAVULANATE 875-125 MG PO TABS: 1.0000 | ORAL_TABLET | Freq: Two times a day (BID) | ORAL | 0 refills | Status: AC

## 2016-10-09 NOTE — Telephone Encounter (Signed)
Spoke with pt in reference to +ucx. Made aware abx were sent to pharmacy. Pt voiced understanding.  

## 2016-10-09 NOTE — Telephone Encounter (Signed)
-----   Message from Nori Riis, PA-C sent at 10/08/2016  5:42 PM EST ----- Patient has a +UCx.  They need to start Augmentin 875/125, one tablet twice daily for seven days.  They also need to take a probiotic with the antibiotic course.  The dosage is listed below:  L. acidophilus and L. casei (25 x 109 CFU/day for 2 days, then 50 x 109 CFU/day for duration of the antibiotic course)

## 2017-01-09 ENCOUNTER — Other Ambulatory Visit: Payer: Medicare Other

## 2017-01-22 ENCOUNTER — Ambulatory Visit: Payer: Medicare Other | Admitting: Urology

## 2017-01-22 VITALS — BP 139/82 | HR 94 | Ht 65.0 in | Wt 156.2 lb

## 2017-01-22 DIAGNOSIS — C679 Malignant neoplasm of bladder, unspecified: Secondary | ICD-10-CM

## 2017-01-22 LAB — MICROSCOPIC EXAMINATION: RBC MICROSCOPIC, UA: NONE SEEN /HPF (ref 0–?)

## 2017-01-22 LAB — URINALYSIS, COMPLETE
Bilirubin, UA: NEGATIVE
Glucose, UA: NEGATIVE
Ketones, UA: NEGATIVE
Nitrite, UA: POSITIVE — AB
Protein, UA: NEGATIVE
Specific Gravity, UA: 1.015 (ref 1.005–1.030)
Urobilinogen, Ur: 0.2 mg/dL (ref 0.2–1.0)
pH, UA: 7 (ref 5.0–7.5)

## 2017-01-22 MED ORDER — LIDOCAINE HCL 2 % EX GEL
1.0000 "application " | Freq: Once | CUTANEOUS | Status: AC
  Administered 2017-01-22: 1 via URETHRAL

## 2017-01-22 MED ORDER — CEPHALEXIN 250 MG PO CAPS
500.0000 mg | ORAL_CAPSULE | Freq: Once | ORAL | Status: AC
  Administered 2017-01-22: 500 mg via ORAL

## 2017-01-22 MED ORDER — CIPROFLOXACIN HCL 500 MG PO TABS: 500.0000 mg | ORAL_TABLET | Freq: Once | ORAL | Status: DC

## 2017-01-22 NOTE — Progress Notes (Signed)
01/22/2017 10:09 AM   Adriana Anderson [birth year] [id number]  Referring provider: Madelyn Brunner, MD [place] [facility] [place], [place] [address]  Chief Complaint  Patient presents with  . 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 She has some mil ddysuria in the AM which clears with water intake. This is normal for her. She feels well. She has cardiology appt tomorrow. We discussed her UA showed Some bacteria and the nature risk and benefits of proceeding with cystoscopy today which she wants to do. I think that's reasonable. Clinically she doesn't seem to have a significant infection. We'll cover her with the cephalexin based on her last culture.    PMH: Past Medical History:  Diagnosis Date  . Acid reflux   . Bladder cancer ([place])   . Bladder mass   . Diabetes mellitus, type 2 ([place])   . GERD (gastroesophageal reflux disease)   . History of biopsy of bladder   . History of cystoscopy   . Liver disease   . PONV (postoperative nausea and vomiting)     Surgical History: Past Surgical History:  Procedure Laterality Date  . APPENDECTOMY    . CYSTOSCOPY WITH BIOPSY N/A 11/15/2015   Procedure: CYSTOSCOPY WITH BIOPSY;  Surgeon: [name], MD;  Location: [facility] ORS;  Service: Urology;  Laterality: N/A;  . TOE SURGERY      Home Medications:  Allergies as of 01/22/2017      Reactions   Tetanus Toxoids Swelling   Tetanus-diphth-acell Pertussis Swelling   Sulfa Antibiotics Rash      Medication  List       Accurate as of 01/22/17 10:09 AM. Always use your most recent med list.          CALTRATE 600+D 600-400 MG-UNIT chew tablet Generic drug:  Calcium Carbonate-Vitamin D Chew 1 tablet by mouth 2 (two) times daily.   CLARITIN PO Take 1 tablet by mouth daily as needed.   DEXILANT 60 MG capsule Generic drug:  dexlansoprazole Take 60 mg by mouth daily.   ESTRACE VAGINAL 0.1 MG/GM vaginal cream Generic drug:  estradiol USE 1 APPLICATION VAGINALLY ONCE A WEEK   MIRALAX PO Take by mouth.   PROBIOTIC DAILY PO Take 1 capsule by mouth daily.   solifenacin 10 MG tablet Commonly known as:  VESICARE Take 1 tablet (10 mg total) by mouth daily.   URIBEL 118 MG Caps Take 1 capsule (118 mg total) by mouth QID.       Allergies:  Allergies  Allergen Reactions  . Tetanus Toxoids Swelling  . Tetanus-Diphth-Acell Pertussis Swelling  . Sulfa Antibiotics Rash    Family History: Family History  Problem Relation Age of Onset  . Bladder Cancer Brother     x 3   . Brain cancer Father   . Kidney disease Father   . Diabetes    . Prostate cancer Neg Hx     Social History:  reports that she quit smoking about 34 years ago. She has never used smokeless tobacco. She reports that she drinks alcohol. She reports that she does not use drugs.  ROS:                                        Laboratory Data: Lab Results  Component Value Date   WBC 5.7 06/08/2012   HGB 11.8 (L) 06/08/2012   HCT 32.6 (L) 06/08/2012   MCV 95 06/08/2012   PLT 158 06/08/2012    Lab Results  Component Value Date   CREATININE 0.96 11/04/2015    No results found for: PSA  No results found for: TESTOSTERONE  No results found for: HGBA1C  Urinalysis    Component Value Date/Time   COLORURINE Amber 06/06/2012 1054   APPEARANCEUR Cloudy (A) 10/04/2016 0933   LABSPEC 1.014 06/06/2012 1054   PHURINE 5.0 06/06/2012 1054   GLUCOSEU Negative 10/04/2016 0933   GLUCOSEU  Negative 06/06/2012 1054   HGBUR 1+ 06/06/2012 1054   BILIRUBINUR Negative 10/04/2016 0933   BILIRUBINUR Negative 06/06/2012 1054   KETONESUR Trace 06/06/2012 1054   PROTEINUR Negative 10/04/2016 0933   PROTEINUR 30 mg/dL 06/06/2012 1054   NITRITE Positive (A) 10/04/2016 0933   NITRITE Negative 06/06/2012 1054   LEUKOCYTESUR 3+ (A) 10/04/2016 0933   LEUKOCYTESUR 3+ 06/06/2012 1054     Cystoscopy Procedure Note  Patient identification was confirmed, informed consent was obtained, and patient was prepped using Betadine solution.  Lidocaine jelly was administered per urethral meatus.    Preoperative abx where received prior to procedure - cephalexin 500 mg.  Chaperone: [place] PE: Abdomen was soft and nontender, no masses GU-mild atrophy. Introitus appeared normal. Vagina appeared normal. Bladder and urethra are palpably normal. Anus appeared normal.  Procedure: - Flexible cystoscope introduced, without any difficulty.   - Thorough search of the bladder revealed:    normal urethral meatus    normal urothelium    no stones    no ulcers     no tumors    no urethral polyps    no trabeculation  - Ureteral orifices were normal in position and appearance.  Post-Procedure: - Patient tolerated the procedure well   Assessment & Plan:    1. Malignant neoplasm of urinary bladder, unspecified site [facility]) -- follow-up 3 months for cystoscopy and begin BCG maintenance. - Urinalysis, Complete - cephalexin 500 mg po x 1  - lidocaine (XYLOCAINE) 2 % jelly 1 application; Place 1 application into the urethra once.   No Follow-up on file.  Festus Aloe, [facility] [address], [place], [place] [address] [phone]

## 2017-01-23 DIAGNOSIS — R0602 Shortness of breath: Secondary | ICD-10-CM | POA: Insufficient documentation

## 2017-01-23 DIAGNOSIS — R079 Chest pain, unspecified: Secondary | ICD-10-CM | POA: Insufficient documentation

## 2017-01-24 LAB — CULTURE, URINE COMPREHENSIVE

## 2017-01-29 ENCOUNTER — Telehealth: Payer: Self-pay

## 2017-01-29 NOTE — Telephone Encounter (Signed)
Pt returned call and I read message from [place]im[place].  She said she isn't having any pain when she urinates.

## 2017-01-29 NOTE — Telephone Encounter (Signed)
Adriana Aloe, MD  Lestine Box, LPN        Notify patient her urine culture was positive and was sensitive to the antibiotic we have her before the cystoscopy. She shouldn't need anymore antibiotics if she feels well and has no dysuria.    LMOM

## 2017-02-23 ENCOUNTER — Ambulatory Visit
Admission: RE | Admit: 2017-02-23 | Discharge: 2017-02-23 | Disposition: A | Discharge status: Home / Self Care | Payer: Medicare Other | Source: Ambulatory Visit | Attending: Cardiology | Admitting: Cardiology

## 2017-02-23 ENCOUNTER — Encounter
Admission: RE | Disposition: A | Discharge status: Home / Self Care | Payer: Self-pay | Source: Ambulatory Visit | Attending: Cardiology

## 2017-02-23 ENCOUNTER — Encounter: Payer: Self-pay | Admitting: *Deleted

## 2017-02-23 DIAGNOSIS — I2510 Atherosclerotic heart disease of native coronary artery without angina pectoris: Secondary | ICD-10-CM | POA: Insufficient documentation

## 2017-02-23 HISTORY — PX: LEFT HEART CATH AND CORONARY ANGIOGRAPHY: CATH118249

## 2017-02-23 SURGERY — LEFT HEART CATH AND CORONARY ANGIOGRAPHY
Anesthesia: Moderate Sedation | Laterality: Left

## 2017-02-23 MED ORDER — SODIUM CHLORIDE 0.9% FLUSH: 3.0000 mL | INTRAVENOUS | Status: DC | PRN

## 2017-02-23 MED ORDER — HEPARIN (PORCINE) IN NACL 2-0.9 UNIT/ML-% IJ SOLN
INTRAMUSCULAR | Status: AC
  Filled 2017-02-23: qty 500

## 2017-02-23 MED ORDER — IOPAMIDOL (ISOVUE-300) INJECTION 61%
INTRAVENOUS | Status: DC | PRN
  Administered 2017-02-23: 75 mL via INTRA_ARTERIAL

## 2017-02-23 MED ORDER — MIDAZOLAM HCL 2 MG/2ML IJ SOLN
INTRAMUSCULAR | Status: DC | PRN
  Administered 2017-02-23: 1 mg via INTRAVENOUS

## 2017-02-23 MED ORDER — MIDAZOLAM HCL 2 MG/2ML IJ SOLN
INTRAMUSCULAR | Status: AC
  Filled 2017-02-23: qty 2

## 2017-02-23 MED ORDER — ASPIRIN 81 MG PO CHEW
81.0000 mg | CHEWABLE_TABLET | ORAL | Status: AC
  Administered 2017-02-23: 81 mg via ORAL

## 2017-02-23 MED ORDER — FENTANYL CITRATE (PF) 100 MCG/2ML IJ SOLN
INTRAMUSCULAR | Status: DC | PRN
  Administered 2017-02-23: 25 ug via INTRAVENOUS

## 2017-02-23 MED ORDER — ASPIRIN 81 MG PO CHEW
CHEWABLE_TABLET | ORAL | Status: AC
  Filled 2017-02-23: qty 1

## 2017-02-23 MED ORDER — SODIUM CHLORIDE 0.9 % WEIGHT BASED INFUSION: 1.0000 mL/kg/h | INTRAVENOUS | Status: DC

## 2017-02-23 MED ORDER — FENTANYL CITRATE (PF) 100 MCG/2ML IJ SOLN
INTRAMUSCULAR | Status: AC
  Filled 2017-02-23: qty 2

## 2017-02-23 MED ORDER — SODIUM CHLORIDE 0.9 % IV SOLN: 250.0000 mL | INTRAVENOUS | Status: DC | PRN

## 2017-02-23 MED ORDER — SODIUM CHLORIDE 0.9 % WEIGHT BASED INFUSION
3.0000 mL/kg/h | INTRAVENOUS | Status: DC
  Administered 2017-02-23: 3 mL/kg/h via INTRAVENOUS

## 2017-02-23 MED ORDER — SODIUM CHLORIDE 0.9% FLUSH: 3.0000 mL | Freq: Two times a day (BID) | INTRAVENOUS | Status: DC

## 2017-02-23 SURGICAL SUPPLY — 10 items
CATH INFINITI 5 FR 3DRC (CATHETERS) ×3 IMPLANT
CATH INFINITI 5FR ANG PIGTAIL (CATHETERS) ×3 IMPLANT
CATH INFINITI 5FR JL4 (CATHETERS) ×3 IMPLANT
CATH INFINITI JR4 5F (CATHETERS) ×3 IMPLANT
DEVICE CLOSURE MYNXGRIP 5F (Vascular Products) ×3 IMPLANT
KIT MANI 3VAL PERCEP (MISCELLANEOUS) ×3 IMPLANT
NEEDLE PERC 18GX7CM (NEEDLE) ×3 IMPLANT
PACK CARDIAC CATH (CUSTOM PROCEDURE TRAY) ×3 IMPLANT
SHEATH AVANTI 5FR X 11CM (SHEATH) ×3 IMPLANT
WIRE EMERALD 3MM-J .035X150CM (WIRE) ×3 IMPLANT

## 2017-02-23 NOTE — Discharge Instructions (Signed)

## 2017-02-27 DIAGNOSIS — I2510 Atherosclerotic heart disease of native coronary artery without angina pectoris: Secondary | ICD-10-CM | POA: Insufficient documentation

## 2017-03-02 DIAGNOSIS — I2510 Atherosclerotic heart disease of native coronary artery without angina pectoris: Secondary | ICD-10-CM | POA: Insufficient documentation

## 2017-03-02 DIAGNOSIS — I2583 Coronary atherosclerosis due to lipid rich plaque: Secondary | ICD-10-CM

## 2017-03-07 DIAGNOSIS — G8918 Other acute postprocedural pain: Secondary | ICD-10-CM | POA: Insufficient documentation

## 2017-03-07 DIAGNOSIS — Z951 Presence of aortocoronary bypass graft: Secondary | ICD-10-CM | POA: Insufficient documentation

## 2017-03-07 DIAGNOSIS — D62 Acute posthemorrhagic anemia: Secondary | ICD-10-CM | POA: Insufficient documentation

## 2017-03-07 HISTORY — PX: CORONARY ARTERY BYPASS GRAFT: SHX141

## 2017-03-09 DIAGNOSIS — D72829 Elevated white blood cell count, unspecified: Secondary | ICD-10-CM | POA: Insufficient documentation

## 2017-03-18 DIAGNOSIS — I4729 Other ventricular tachycardia: Secondary | ICD-10-CM | POA: Insufficient documentation

## 2017-03-18 DIAGNOSIS — I4891 Unspecified atrial fibrillation: Secondary | ICD-10-CM | POA: Insufficient documentation

## 2017-03-18 DIAGNOSIS — I472 Ventricular tachycardia: Secondary | ICD-10-CM | POA: Insufficient documentation

## 2017-03-19 DIAGNOSIS — N390 Urinary tract infection, site not specified: Secondary | ICD-10-CM | POA: Insufficient documentation

## 2017-03-19 DIAGNOSIS — E876 Hypokalemia: Secondary | ICD-10-CM | POA: Insufficient documentation

## 2017-04-16 ENCOUNTER — Encounter: Payer: Medicare Other | Attending: Cardiology | Admitting: *Deleted

## 2017-04-16 VITALS — Ht 64.7 in | Wt 145.4 lb

## 2017-04-16 DIAGNOSIS — K769 Liver disease, unspecified: Secondary | ICD-10-CM | POA: Diagnosis not present

## 2017-04-16 DIAGNOSIS — Z951 Presence of aortocoronary bypass graft: Secondary | ICD-10-CM

## 2017-04-16 DIAGNOSIS — Z7901 Long term (current) use of anticoagulants: Secondary | ICD-10-CM | POA: Insufficient documentation

## 2017-04-16 DIAGNOSIS — K219 Gastro-esophageal reflux disease without esophagitis: Secondary | ICD-10-CM | POA: Insufficient documentation

## 2017-04-16 DIAGNOSIS — E119 Type 2 diabetes mellitus without complications: Secondary | ICD-10-CM | POA: Insufficient documentation

## 2017-04-16 DIAGNOSIS — Z79899 Other long term (current) drug therapy: Secondary | ICD-10-CM | POA: Diagnosis not present

## 2017-04-16 DIAGNOSIS — Z7982 Long term (current) use of aspirin: Secondary | ICD-10-CM | POA: Diagnosis not present

## 2017-04-16 DIAGNOSIS — Z87891 Personal history of nicotine dependence: Secondary | ICD-10-CM | POA: Insufficient documentation

## 2017-04-16 NOTE — Progress Notes (Signed)
Daily Session Note  Patient Details  Name: TYONNA TALERICO MRN: [id number] Date of Birth: 10-28-1940 Referring Provider:  Saralyn Pilar  Encounter Date: 04/16/2017  Check In:     Session Check In - 04/16/17 1204      Check-In   Location [facility]   Staff Present Heath Lark, RN, BSN, CCRP;Jessica Waco, MA, ACSM RCEP, Exercise Physiologist;Belkys Henault, RN, BSN   Supervising physician immediately available to respond to emergencies See telemetry face sheet for immediately available ER MD   Medication changes reported     No   Fall or balance concerns reported    No   Tobacco Cessation No Change   Warm-up and Cool-down Performed as group-led instruction   Resistance Training Performed Yes   VAD Patient? No     Pain Assessment   Currently in Pain? No/denies         History  Smoking Status  . Former Smoker  . Quit date: 04/20/1982  Smokeless Tobacco  . Never Used    Goals Met:  Proper associated with RPD/PD & O2 Sat Exercise tolerated well Personal goals reviewed No report of cardiac concerns or symptoms Strength training completed today  Goals Unmet:  Not Applicable  Comments:     Dr. Emily Filbert is Medical Director for [facility].

## 2017-04-16 NOTE — Progress Notes (Signed)
Cardiac Individual Treatment Plan  Patient Details  Name: Adriana Anderson MRN: [id number] Date of Birth: 1941-03-22 Referring Provider:   Initial Encounter Date:    Cardiac Rehab from 04/16/2017 in [facility] Cardiac and Pulmonary Rehab  Date  04/16/17  Referring Provider  Isaias Cowman MD      Visit Diagnosis: S/P CABG x 3  Patient's Home Medications on Admission:  Current Outpatient Prescriptions:  .  amiodarone (PACERONE) 200 MG tablet, Take 400mg  (2 tablets) twice a day for 6 days. Then reduced to 200mg  (1 tablet) a day starting 6/24., Disp: , Rfl:  .  apixaban (ELIQUIS) 5 MG TABS tablet, Take by mouth., Disp: , Rfl:  .  aspirin EC 81 MG tablet, Take 81 mg by mouth at bedtime., Disp: , Rfl:  .  atorvastatin (LIPITOR) 40 MG tablet, Take by mouth., Disp: , Rfl:  .  calcium carbonate (TUMS EXTRA STRENGTH 750) 750 MG chewable tablet, Chew 2,250-3,750 tablets by mouth at bedtime as needed for heartburn., Disp: , Rfl:  .  Calcium Carbonate-Vitamin D (CALTRATE 600+D PO), Take 1 tablet by mouth 2 (two) times daily., Disp: , Rfl:  .  ESTRACE VAGINAL 0.1 MG/GM vaginal cream, USE 1 APPLICATION VAGINALLY ONCE A WEEK, Disp: 42.5 g, Rfl: 3 .  loratadine (CLARITIN) 10 MG tablet, Take 10 mg by mouth daily as needed for allergies., Disp: , Rfl:  .  Meth-Hyo-M Bl-Na Phos-Ph Sal (URIBEL) 118 MG CAPS, Take 1 capsule (118 mg total) by mouth QID. (Patient taking differently: Take 1 capsule by mouth 4 (four) times daily as needed (bladder problems). ), Disp: 40 capsule, Rfl: 3 .  metoprolol tartrate (LOPRESSOR) 25 MG tablet, Take by mouth., Disp: , Rfl:  .  omeprazole-sodium bicarbonate (ZEGERID) 40-1100 MG capsule, Take 1 capsule by mouth daily. , Disp: , Rfl:  .  Probiotic Product (PROBIOTIC DAILY PO), Take 1 capsule by mouth daily., Disp: , Rfl:  .  polyethylene glycol powder (GLYCOLAX/MIRALAX) powder, Take 17 g by mouth at bedtime., Disp: , Rfl:  .  ranitidine (ZANTAC) 150 MG tablet, Take 150 mg by  mouth at bedtime as needed for heartburn., Disp: , Rfl:  .  solifenacin (VESICARE) 10 MG tablet, Take 1 tablet (10 mg total) by mouth daily. (Patient not taking: Reported on 04/16/2017), Disp: 90 tablet, Rfl: 2  Past Medical History: Past Medical History:  Diagnosis Date  . Acid reflux   . Bladder cancer ([place])   . Bladder mass   . Diabetes mellitus, type 2 ([place])   . GERD (gastroesophageal reflux disease)   . History of biopsy of bladder   . History of cystoscopy   . Liver disease   . PONV (postoperative nausea and vomiting)     Tobacco Use: History  Smoking Status  . Former Smoker  . Quit date: 04/20/1982  Smokeless Tobacco  . Never Used    Labs: Recent Review Flowsheet Data    There is no flowsheet data to display.       Exercise Target Goals: Date: 04/16/17  Exercise Program Goal: Individual exercise prescription set with THRR, safety & activity barriers. Participant demonstrates ability to understand and report RPE using BORG scale, to self-measure pulse accurately, and to acknowledge the importance of the exercise prescription.  Exercise Prescription Goal: Starting with aerobic activity 30 plus minutes a day, 3 days per week for initial exercise prescription. Provide home exercise prescription and guidelines that participant acknowledges understanding prior to discharge.  Activity Barriers & Risk Stratification:  Activity Barriers & Cardiac Risk Stratification - 04/16/17 1048      Activity Barriers & Cardiac Risk Stratification   Activity Barriers Back Problems;Deconditioning;Muscular Weakness;Shortness of Breath;Balance Concerns;Neck/Spine Problems  low back pain, when she reaches up she has neck pain bilaterally   Cardiac Risk Stratification High      6 Minute Walk:     6 Minute Walk    Row Name 04/16/17 1444         6 Minute Walk   Phase Initial     Distance 930 feet     Walk Time 5.8 minutes     # of Rest Breaks 1  12 sec     MPH 1.82      METS 1.82     RPE 13     Perceived Dyspnea  2     VO2 Peak 6.39     Symptoms Yes (comment)     Comments fatigue and SOB     Resting HR 64 bpm     Resting BP 128/78     Max Ex. HR 72 bpm     Max Ex. BP 146/76     2 Minute Post BP 132/72        Oxygen Initial Assessment:     Oxygen Initial Assessment - 04/16/17 1208      Home Oxygen   Home Oxygen Device None     Initial 6 min Walk   Oxygen Used None      Oxygen Re-Evaluation:   Oxygen Discharge (Final Oxygen Re-Evaluation):   Initial Exercise Prescription:     Initial Exercise Prescription - 04/16/17 1400      Date of Initial Exercise RX and Referring Provider   Date 04/16/17   Referring Provider Paraschos, Alexander MD     Treadmill   MPH 1.1   Grade 0   Minutes 15   METs 1.84     NuStep   Level 1   SPM 80   Minutes 15   METs 1.8     Biostep-RELP   Level 1   SPM 50   Minutes 15   METs 2     Prescription Details   Frequency (times per week) 2   Duration Progress to 45 minutes of aerobic exercise without signs/symptoms of physical distress     Intensity   THRR 40-80% of Max Heartrate 96-129   Ratings of Perceived Exertion 11-13   Perceived Dyspnea 0-4     Progression   Progression Continue to progress workloads to maintain intensity without signs/symptoms of physical distress.     Resistance Training   Training Prescription Yes   Weight 2 lbs   Reps 10-15      Perform Capillary Blood Glucose checks as needed.  Exercise Prescription Changes:      Exercise Prescription Changes    Row Name 04/16/17 1400             Response to Exercise   Blood Pressure (Admit) 128/78       Blood Pressure (Exercise) 146/76       Blood Pressure (Exit) 132/72       Heart Rate (Admit) 64 bpm       Heart Rate (Exercise) 72 bpm       Heart Rate (Exit) 59 bpm       Oxygen Saturation (Admit) 98 %       Oxygen Saturation (Exercise) 100 %       Rating of Perceived Exertion (Exercise) 13  Perceived Dyspnea (Exercise) 2       Symptoms fatigue and SOB       Comments walk test results          Exercise Comments:   Exercise Goals and Review:      Exercise Goals    Row Name 04/16/17 1453             Exercise Goals   Increase Physical Activity Yes       Intervention Provide advice, education, support and counseling about physical activity/exercise needs.;Develop an individualized exercise prescription for aerobic and resistive training based on initial evaluation findings, risk stratification, comorbidities and participant's personal goals.       Expected Outcomes Achievement of increased cardiorespiratory fitness and enhanced flexibility, muscular endurance and strength shown through measurements of functional capacity and personal statement of participant.       Increase Strength and Stamina Yes       Intervention Provide advice, education, support and counseling about physical activity/exercise needs.;Develop an individualized exercise prescription for aerobic and resistive training based on initial evaluation findings, risk stratification, comorbidities and participant's personal goals.       Expected Outcomes Achievement of increased cardiorespiratory fitness and enhanced flexibility, muscular endurance and strength shown through measurements of functional capacity and personal statement of participant.          Exercise Goals Re-Evaluation :   Discharge Exercise Prescription (Final Exercise Prescription Changes):     Exercise Prescription Changes - 04/16/17 1400      Response to Exercise   Blood Pressure (Admit) 128/78   Blood Pressure (Exercise) 146/76   Blood Pressure (Exit) 132/72   Heart Rate (Admit) 64 bpm   Heart Rate (Exercise) 72 bpm   Heart Rate (Exit) 59 bpm   Oxygen Saturation (Admit) 98 %   Oxygen Saturation (Exercise) 100 %   Rating of Perceived Exertion (Exercise) 13   Perceived Dyspnea (Exercise) 2   Symptoms fatigue and SOB   Comments  walk test results      Nutrition:  Target Goals: Understanding of nutrition guidelines, daily intake of sodium 1500mg , cholesterol 200mg , calories 30% from fat and 7% or less from saturated fats, daily to have 5 or more servings of fruits and vegetables.  Biometrics:     Pre Biometrics - 04/16/17 1453      Pre Biometrics   Height 5' 4.7" (1.643 m)   Weight 145 lb 6.4 oz (66 kg)   Waist Circumference 34.5 inches   Hip Circumference 38 inches   Waist to Hip Ratio 0.91 %   BMI (Calculated) 24.5   Single Leg Stand 5.83 seconds       Nutrition Therapy Plan and Nutrition Goals:     Nutrition Therapy & Goals - 04/16/17 1208      Nutrition Therapy   Drug/Food Interactions Statins/Certain Fruits     Intervention Plan   Expected Outcomes Short Term Goal: Understand basic principles of dietary content, such as calories, fat, sodium, cholesterol and nutrients.      Nutrition Discharge: Rate Your Plate Scores:     Nutrition Assessments - 04/16/17 1047      MEDFICTS Scores   Pre Score 31      Nutrition Goals Re-Evaluation:   Nutrition Goals Discharge (Final Nutrition Goals Re-Evaluation):   Psychosocial: Target Goals: Acknowledge presence or absence of significant depression and/or stress, maximize coping skills, provide positive support system. Participant is able to verbalize types and ability to use techniques and skills needed for  reducing stress and depression.   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       Quality of Life Scores:      Quality of Life - 04/16/17 1043      Quality of Life Scores   Health/Function Pre 16.96 %   Socioeconomic Pre 27.14 %   Psych/Spiritual Pre 29.29 %   Family Pre 30 %   GLOBAL Pre 23.71 %      PHQ-9: Recent Review Flowsheet Data    Depression screen  [facility] 2/9 04/16/2017   Decreased Interest 0   Down, Depressed, Hopeless 0   PHQ - 2 Score 0   Altered sleeping 0   Tired, decreased energy 3   Change in appetite 2   Feeling bad or failure about yourself  0   Trouble concentrating 0   Moving slowly or fidgety/restless 0   Suicidal thoughts 0   PHQ-9 Score 5   Difficult doing work/chores Somewhat difficult     Interpretation of Total Score  Total Score Depression Severity:  1-4 = Minimal depression, 5-9 = Mild depression, 10-14 = Moderate depression, 15-19 = Moderately severe depression, 20-27 = Severe depression   Psychosocial Evaluation and Intervention:   Psychosocial Re-Evaluation:   Psychosocial Discharge (Final Psychosocial Re-Evaluation):   Vocational Rehabilitation: Provide vocational rehab assistance to qualifying candidates.   Vocational Rehab Evaluation & Intervention:     Vocational Rehab - 04/16/17 1047      Initial Vocational Rehab Evaluation & Intervention   Assessment shows need for Vocational Rehabilitation No      Education: Education Goals: Education classes will be provided on a weekly basis, covering required topics. Participant will state understanding/return demonstration of topics presented.  Learning Barriers/Preferences:     Learning Barriers/Preferences - 04/16/17 1048      Learning Barriers/Preferences   Learning Barriers Hearing   Learning Preferences Individual Instruction      Education Topics: General Nutrition Guidelines/Fats and Fiber: -Group instruction provided by verbal, written material, models and posters to present the general guidelines for heart healthy nutrition. Gives an explanation and review of dietary fats and fiber.   Controlling Sodium/Reading Food Labels: -Group verbal and written material supporting the discussion of sodium use in heart healthy nutrition. Review and explanation with models, verbal and written materials for utilization of the food  label.   Exercise Physiology & Risk Factors: - Group verbal and written instruction with models to review the exercise physiology of the cardiovascular system and associated critical values. Details cardiovascular disease risk factors and the goals associated with each risk factor.   Aerobic Exercise & Resistance Training: - Gives group verbal and written discussion on the health impact of inactivity. On the components of aerobic and resistive training programs and the benefits of this training and how to safely progress through these programs.   Flexibility, Balance, General Exercise Guidelines: - Provides group verbal and written instruction on the benefits of flexibility and balance training programs. Provides general exercise guidelines with specific guidelines to those with heart or lung disease. Demonstration and skill practice provided.   Stress Management: - Provides group verbal and written instruction about the health risks of elevated stress, cause of high stress, and healthy ways to reduce stress.   Depression: - Provides group verbal and written  instruction on the correlation between heart/lung disease and depressed mood, treatment options, and the stigmas associated with seeking treatment.   Anatomy & Physiology of the Heart: - Group verbal and written instruction and models provide basic cardiac anatomy and physiology, with the coronary electrical and arterial systems. Review of: AMI, Angina, Valve disease, Heart Failure, Cardiac Arrhythmia, Pacemakers, and the ICD.   Cardiac Procedures: - Group verbal and written instruction and models to describe the testing methods done to diagnose heart disease. Reviews the outcomes of the test results. Describes the treatment choices: Medical Management, Angioplasty, or Coronary Bypass Surgery.   Cardiac Medications: - Group verbal and written instruction to review commonly prescribed medications for heart disease. Reviews the  medication, class of the drug, and side effects. Includes the steps to properly store meds and maintain the prescription regimen.   Go Sex-Intimacy & Heart Disease, Get SMART - Goal Setting: - Group verbal and written instruction through game format to discuss heart disease and the return to sexual intimacy. Provides group verbal and written material to discuss and apply goal setting through the application of the S.M.A.R.T. Method.   Other Matters of the Heart: - Provides group verbal, written materials and models to describe Heart Failure, Angina, Valve Disease, and Diabetes in the realm of heart disease. Includes description of the disease process and treatment options available to the cardiac patient.   Exercise & Equipment Safety: - Individual verbal instruction and demonstration of equipment use and safety with use of the equipment.   Cardiac Rehab from 04/16/2017 in [facility] Cardiac and Pulmonary Rehab  Date  04/16/17  Educator  C. EnterkinRN  Instruction Review Code  1- partially meets, needs review/practice      Infection Prevention: - Provides verbal and written material to individual with discussion of infection control including proper hand washing and proper equipment cleaning during exercise session.   Cardiac Rehab from 04/16/2017 in [facility] Cardiac and Pulmonary Rehab  Date  04/16/17  Educator  C. [place]  Instruction Review Code  1- partially meets, needs review/practice      Falls Prevention: - Provides verbal and written material to individual with discussion of falls prevention and safety.   Cardiac Rehab from 04/16/2017 in [facility] Cardiac and Pulmonary Rehab  Date  04/16/17  Educator  C. [place]  Instruction Review Code  1- partially meets, needs review/practice      Diabetes: - Individual verbal and written instruction to review signs/symptoms of diabetes, desired ranges of glucose level fasting, after meals and with exercise. Advice that pre and post exercise  glucose checks will be done for 3 sessions at entry of program.   Cardiac Rehab from 04/16/2017 in [facility] Cardiac and Pulmonary Rehab  Date  04/16/17  Educator  C. Rodrecus Belsky,RN [diet controlled diabetes-no medicines for it.]  Instruction Review Code  1- partially meets, needs review/practice       Knowledge Questionnaire Score:     Knowledge Questionnaire Score - 04/16/17 1045      Knowledge Questionnaire Score   Pre Score 26/28      Core Components/Risk Factors/Patient Goals at Admission:     Personal Goals and Risk Factors at Admission - 04/16/17 1208      Core Components/Risk Factors/Patient Goals on Admission    Weight Management Yes;Weight Maintenance   Intervention Weight Management: Develop a combined nutrition and exercise program designed to reach desired caloric intake, while maintaining appropriate intake of nutrient and fiber, sodium and fats, and appropriate energy expenditure required for the  weight goal.;Weight Management: Provide education and appropriate resources to help participant work on and attain dietary goals.   Admit Weight 145 lb 6.4 oz (66 kg)   Goal Weight: Short Term 145 lb (65.8 kg)   Goal Weight: Long Term 142 lb (64.4 kg)   Expected Outcomes Long Term: Adherence to nutrition and physical activity/exercise program aimed toward attainment of established weight goal;Weight Maintenance: Understanding of the daily nutrition guidelines, which includes 25-35% calories from fat, 7% or less cal from saturated fats, less than 200mg  cholesterol, less than 1.5gm of sodium, & 5 or more servings of fruits and vegetables daily;Short Term: Continue to assess and modify interventions until short term weight is achieved   Improve shortness of breath with ADL's Yes  c/o being tired also   Intervention Provide education, individualized exercise plan and daily activity instruction to help decrease symptoms of SOB with activities of daily living.   Expected Outcomes Short  Term: Achieves a reduction of symptoms when performing activities of daily living.   Diabetes Yes   Intervention Provide education about signs/symptoms and action to take for hypo/hyperglycemia.;Provide education about proper nutrition, including hydration, and aerobic/resistive exercise prescription along with prescribed medications to achieve blood glucose in normal ranges: Fasting glucose 65-99 mg/dL   Expected Outcomes Short Term: Participant verbalizes understanding of the signs/symptoms and immediate care of hyper/hypoglycemia, proper foot care and importance of medication, aerobic/resistive exercise and nutrition plan for blood glucose control.;Long Term: Attainment of HbA1C < 7%.      Core Components/Risk Factors/Patient Goals Review:    Core Components/Risk Factors/Patient Goals at Discharge (Final Review):    ITP Comments:     ITP Comments    Row Name 04/16/17 1206 04/16/17 1214         ITP Comments Medical Review ITP created during Orientation after Cardiac REhab informed consent was signed at 10:28am today. CABG x3 diagnosis documented in [facility] 03/17/2017 [facility] Encounter where her surgery was done.  Adriana Anderson c/o her neck hurting when she lifts her arms up over her head. Adriana Anderson reports that she has been careful not to lift over 5lbs and her husband even weighed her purse which did not weigh more than 5 lbs.         Comments: Adriana Anderson is ready to start Cardiac REhab.

## 2017-04-16 NOTE — Patient Instructions (Addendum)
Patient Instructions  Patient Details  Name: Adriana Anderson MRN: [id number] Date of Birth: [birth year] Referring Provider:  Isaias Cowman, MD  Below are the personal goals you chose as well as exercise and nutrition goals. Our goal is to help you keep on track towards obtaining and maintaining your goals. We will be discussing your progress on these goals with you throughout the program.  Initial Exercise Prescription:     Initial Exercise Prescription - 04/16/17 1400      Date of Initial Exercise RX and Referring Provider   Date 04/16/17   Referring Provider Paraschos, Alexander MD     Treadmill   MPH 1.1   Grade 0   Minutes 15   METs 1.84     NuStep   Level 1   SPM 80   Minutes 15   METs 1.8     Biostep-RELP   Level 1   SPM 50   Minutes 15   METs 2     Prescription Details   Frequency (times per week) 2   Duration Progress to 45 minutes of aerobic exercise without signs/symptoms of physical distress     Intensity   THRR 40-80% of Max Heartrate 96-129   Ratings of Perceived Exertion 11-13   Perceived Dyspnea 0-4     Progression   Progression Continue to progress workloads to maintain intensity without signs/symptoms of physical distress.     Resistance Training   Training Prescription Yes   Weight 2 lbs   Reps 10-15      Exercise Goals: Frequency: Be able to perform aerobic exercise three times per week working toward 3-5 days per week.  Intensity: Work with a perceived exertion of 11 (fairly light) - 15 (hard) as tolerated. Follow your new exercise prescription and watch for changes in prescription as you progress with the program. Changes will be reviewed with you when they are made.  Duration: You should be able to do 30 minutes of continuous aerobic exercise in addition to a 5 minute warm-up and a 5 minute cool-down routine.  Nutrition Goals: Your personal nutrition goals will be established when you do your nutrition analysis with the  dietician.  The following are nutrition guidelines to follow: Cholesterol < 200mg /day Sodium < 1500mg /day Fiber: Women over 50 yrs - 21 grams per day  Personal Goals:     Personal Goals and Risk Factors at Admission - 04/16/17 1208      Core Components/Risk Factors/Patient Goals on Admission    Weight Management Yes;Weight Maintenance   Intervention Weight Management: Develop a combined nutrition and exercise program designed to reach desired caloric intake, while maintaining appropriate intake of nutrient and fiber, sodium and fats, and appropriate energy expenditure required for the weight goal.;Weight Management: Provide education and appropriate resources to help participant work on and attain dietary goals.   Admit Weight 145 lb 6.4 oz (66 kg)   Goal Weight: Short Term 145 lb (65.8 kg)   Goal Weight: Long Term 142 lb (64.4 kg)   Expected Outcomes Long Term: Adherence to nutrition and physical activity/exercise program aimed toward attainment of established weight goal;Weight Maintenance: Understanding of the daily nutrition guidelines, which includes 25-35% calories from fat, 7% or less cal from saturated fats, less than 200mg  cholesterol, less than 1.5gm of sodium, & 5 or more servings of fruits and vegetables daily;Short Term: Continue to assess and modify interventions until short term weight is achieved   Improve shortness of breath with ADL's Yes  c/o being  tired also   Intervention Provide education, individualized exercise plan and daily activity instruction to help decrease symptoms of SOB with activities of daily living.   Expected Outcomes Short Term: Achieves a reduction of symptoms when performing activities of daily living.   Diabetes Yes   Intervention Provide education about signs/symptoms and action to take for hypo/hyperglycemia.;Provide education about proper nutrition, including hydration, and aerobic/resistive exercise prescription along with prescribed medications to  achieve blood glucose in normal ranges: Fasting glucose 65-99 mg/dL   Expected Outcomes Short Term: Participant verbalizes understanding of the signs/symptoms and immediate care of hyper/hypoglycemia, proper foot care and importance of medication, aerobic/resistive exercise and nutrition plan for blood glucose control.;Long Term: Attainment of HbA1C < 7%.      Tobacco Use Initial Evaluation: History  Smoking Status  . Former Smoker  . Quit date: 04/20/1982  Smokeless Tobacco  . Never Used    Copy of goals given to participant.

## 2017-04-17 ENCOUNTER — Encounter: Payer: Medicare Other | Admitting: *Deleted

## 2017-04-17 DIAGNOSIS — Z951 Presence of aortocoronary bypass graft: Secondary | ICD-10-CM

## 2017-04-17 LAB — GLUCOSE, CAPILLARY
Glucose-Capillary: 139 mg/dL — ABNORMAL HIGH (ref 65–99)
Glucose-Capillary: 160 mg/dL — ABNORMAL HIGH (ref 65–99)

## 2017-04-17 NOTE — Progress Notes (Signed)
Daily Session Note  Patient Details  Name: Adriana Anderson MRN: [id number] Date of Birth: 22-Mar-1941 Referring Provider:     Cardiac Rehab from 04/16/2017 in [facility] Cardiac and Pulmonary Rehab  Referring Provider  Isaias Cowman MD      Encounter Date: 04/17/2017  Check In:     Session Check In - 04/17/17 0829      Check-In   Location [facility]-Cardiac & Pulmonary Rehab   Staff Present Heath Lark, RN, BSN, CCRP;Jessica Luan Pulling, MA, ACSM RCEP, Exercise Physiologist;Amanda Oletta Darter, BA, ACSM CEP, Exercise Physiologist   Supervising physician immediately available to respond to emergencies See telemetry face sheet for immediately available ER MD   Medication changes reported     No   Fall or balance concerns reported    No   Warm-up and Cool-down Performed on first and last piece of equipment   Resistance Training Performed Yes   VAD Patient? No     Pain Assessment   Currently in Pain? No/denies   Multiple Pain Sites No           Exercise Prescription Changes - 04/16/17 1400      Response to Exercise   Blood Pressure (Admit) 128/78   Blood Pressure (Exercise) 146/76   Blood Pressure (Exit) 132/72   Heart Rate (Admit) 64 bpm   Heart Rate (Exercise) 72 bpm   Heart Rate (Exit) 59 bpm   Oxygen Saturation (Admit) 98 %   Oxygen Saturation (Exercise) 100 %   Rating of Perceived Exertion (Exercise) 13   Perceived Dyspnea (Exercise) 2   Symptoms fatigue and SOB   Comments walk test results      History  Smoking Status  . Former Smoker  . Quit date: 04/20/1982  Smokeless Tobacco  . Never Used    Goals Met:  Exercise tolerated well No report of cardiac concerns or symptoms Strength training completed today  Goals Unmet:  Not Applicable  Comments: First full day of exercise!  Patient was oriented to gym and equipment including functions, settings, policies, and procedures.  Patient's individual exercise prescription and treatment plan were reviewed.  All starting  workloads were established based on the results of the 6 minute walk test done at initial orientation visit.  The plan for exercise progression was also introduced and progression will be customized based on patient's performance and goals.    Dr. Emily Filbert is Medical Director for [name] and [facility].

## 2017-04-18 ENCOUNTER — Encounter: Payer: Self-pay | Admitting: *Deleted

## 2017-04-18 DIAGNOSIS — Z951 Presence of aortocoronary bypass graft: Secondary | ICD-10-CM

## 2017-04-18 NOTE — Progress Notes (Signed)
Cardiac Individual Treatment Plan  Patient Details  Name: Adriana Anderson MRN: [id number] Date of Birth: Nov 23, 1940 Referring Provider:     Cardiac Rehab from 04/16/2017 in [facility] Cardiac and Pulmonary Rehab  Referring Provider  Isaias Cowman MD      Initial Encounter Date:    Cardiac Rehab from 04/16/2017 in [facility] Cardiac and Pulmonary Rehab  Date  04/16/17  Referring Provider  Isaias Cowman MD      Visit Diagnosis: S/P CABG x 3  Patient's Home Medications on Admission:  Current Outpatient Prescriptions:  .  amiodarone (PACERONE) 200 MG tablet, Take 400mg  (2 tablets) twice a day for 6 days. Then reduced to 200mg  (1 tablet) a day starting 6/24., Disp: , Rfl:  .  apixaban (ELIQUIS) 5 MG TABS tablet, Take by mouth., Disp: , Rfl:  .  aspirin EC 81 MG tablet, Take 81 mg by mouth at bedtime., Disp: , Rfl:  .  atorvastatin (LIPITOR) 40 MG tablet, Take by mouth., Disp: , Rfl:  .  calcium carbonate (TUMS EXTRA STRENGTH 750) 750 MG chewable tablet, Chew 2,250-3,750 tablets by mouth at bedtime as needed for heartburn., Disp: , Rfl:  .  Calcium Carbonate-Vitamin D (CALTRATE 600+D PO), Take 1 tablet by mouth 2 (two) times daily., Disp: , Rfl:  .  ESTRACE VAGINAL 0.1 MG/GM vaginal cream, USE 1 APPLICATION VAGINALLY ONCE A WEEK, Disp: 42.5 g, Rfl: 3 .  loratadine (CLARITIN) 10 MG tablet, Take 10 mg by mouth daily as needed for allergies., Disp: , Rfl:  .  Meth-Hyo-M Bl-Na Phos-Ph Sal (URIBEL) 118 MG CAPS, Take 1 capsule (118 mg total) by mouth QID. (Patient taking differently: Take 1 capsule by mouth 4 (four) times daily as needed (bladder problems). ), Disp: 40 capsule, Rfl: 3 .  metoprolol tartrate (LOPRESSOR) 25 MG tablet, Take by mouth., Disp: , Rfl:  .  omeprazole-sodium bicarbonate (ZEGERID) 40-1100 MG capsule, Take 1 capsule by mouth daily. , Disp: , Rfl:  .  polyethylene glycol powder (GLYCOLAX/MIRALAX) powder, Take 17 g by mouth at bedtime., Disp: , Rfl:  .  Probiotic Product  (PROBIOTIC DAILY PO), Take 1 capsule by mouth daily., Disp: , Rfl:  .  ranitidine (ZANTAC) 150 MG tablet, Take 150 mg by mouth at bedtime as needed for heartburn., Disp: , Rfl:  .  solifenacin (VESICARE) 10 MG tablet, Take 1 tablet (10 mg total) by mouth daily. (Patient not taking: Reported on 04/16/2017), Disp: 90 tablet, Rfl: 2  Past Medical History: Past Medical History:  Diagnosis Date  . Acid reflux   . Bladder cancer ([place])   . Bladder mass   . Diabetes mellitus, type 2 ([place])   . GERD (gastroesophageal reflux disease)   . History of biopsy of bladder   . History of cystoscopy   . Liver disease   . PONV (postoperative nausea and vomiting)     Tobacco Use: History  Smoking Status  . Former Smoker  . Quit date: 04/20/1982  Smokeless Tobacco  . Never Used    Labs: Recent Review Flowsheet Data    There is no flowsheet data to display.       Exercise Target Goals:    Exercise Program Goal: Individual exercise prescription set with THRR, safety & activity barriers. Participant demonstrates ability to understand and report RPE using BORG scale, to self-measure pulse accurately, and to acknowledge the importance of the exercise prescription.  Exercise Prescription Goal: Starting with aerobic activity 30 plus minutes a day, 3 days per week for  initial exercise prescription. Provide home exercise prescription and guidelines that participant acknowledges understanding prior to discharge.  Activity Barriers & Risk Stratification:     Activity Barriers & Cardiac Risk Stratification - 04/16/17 1048      Activity Barriers & Cardiac Risk Stratification   Activity Barriers Back Problems;Deconditioning;Muscular Weakness;Shortness of Breath;Balance Concerns;Neck/Spine Problems  low back pain, when she reaches up she has neck pain bilaterally   Cardiac Risk Stratification High      6 Minute Walk:     6 Minute Walk    Row Name 04/16/17 1444         6 Minute Walk   Phase  Initial     Distance 930 feet     Walk Time 5.8 minutes     # of Rest Breaks 1  12 sec     MPH 1.82     METS 1.82     RPE 13     Perceived Dyspnea  2     VO2 Peak 6.39     Symptoms Yes (comment)     Comments fatigue and SOB     Resting HR 64 bpm     Resting BP 128/78     Max Ex. HR 72 bpm     Max Ex. BP 146/76     2 Minute Post BP 132/72        Oxygen Initial Assessment:     Oxygen Initial Assessment - 04/16/17 1208      Home Oxygen   Home Oxygen Device None     Initial 6 min Walk   Oxygen Used None      Oxygen Re-Evaluation:   Oxygen Discharge (Final Oxygen Re-Evaluation):   Initial Exercise Prescription:     Initial Exercise Prescription - 04/16/17 1400      Date of Initial Exercise RX and Referring Provider   Date 04/16/17   Referring Provider Paraschos, Alexander MD     Treadmill   MPH 1.1   Grade 0   Minutes 15   METs 1.84     NuStep   Level 1   SPM 80   Minutes 15   METs 1.8     Biostep-RELP   Level 1   SPM 50   Minutes 15   METs 2     Prescription Details   Frequency (times per week) 2   Duration Progress to 45 minutes of aerobic exercise without signs/symptoms of physical distress     Intensity   THRR 40-80% of Max Heartrate 96-129   Ratings of Perceived Exertion 11-13   Perceived Dyspnea 0-4     Progression   Progression Continue to progress workloads to maintain intensity without signs/symptoms of physical distress.     Resistance Training   Training Prescription Yes   Weight 2 lbs   Reps 10-15      Perform Capillary Blood Glucose checks as needed.  Exercise Prescription Changes:     Exercise Prescription Changes    Row Name 04/16/17 1400             Response to Exercise   Blood Pressure (Admit) 128/78       Blood Pressure (Exercise) 146/76       Blood Pressure (Exit) 132/72       Heart Rate (Admit) 64 bpm       Heart Rate (Exercise) 72 bpm       Heart Rate (Exit) 59 bpm       Oxygen Saturation (Admit)  98 %  Oxygen Saturation (Exercise) 100 %       Rating of Perceived Exertion (Exercise) 13       Perceived Dyspnea (Exercise) 2       Symptoms fatigue and SOB       Comments walk test results          Exercise Comments:     Exercise Comments    Row Name 04/17/17 0951           Exercise Comments First full day of exercise!  Patient was oriented to gym and equipment including functions, settings, policies, and procedures.  Patient's individual exercise prescription and treatment plan were reviewed.  All starting workloads were established based on the results of the 6 minute walk test done at initial orientation visit.  The plan for exercise progression was also introduced and progression will be customized based on patient's performance and goals.          Exercise Goals and Review:     Exercise Goals    Row Name 04/16/17 1453             Exercise Goals   Increase Physical Activity Yes       Intervention Provide advice, education, support and counseling about physical activity/exercise needs.;Develop an individualized exercise prescription for aerobic and resistive training based on initial evaluation findings, risk stratification, comorbidities and participant's personal goals.       Expected Outcomes Achievement of increased cardiorespiratory fitness and enhanced flexibility, muscular endurance and strength shown through measurements of functional capacity and personal statement of participant.       Increase Strength and Stamina Yes       Intervention Provide advice, education, support and counseling about physical activity/exercise needs.;Develop an individualized exercise prescription for aerobic and resistive training based on initial evaluation findings, risk stratification, comorbidities and participant's personal goals.       Expected Outcomes Achievement of increased cardiorespiratory fitness and enhanced flexibility, muscular endurance and strength shown through  measurements of functional capacity and personal statement of participant.          Exercise Goals Re-Evaluation :   Discharge Exercise Prescription (Final Exercise Prescription Changes):     Exercise Prescription Changes - 04/16/17 1400      Response to Exercise   Blood Pressure (Admit) 128/78   Blood Pressure (Exercise) 146/76   Blood Pressure (Exit) 132/72   Heart Rate (Admit) 64 bpm   Heart Rate (Exercise) 72 bpm   Heart Rate (Exit) 59 bpm   Oxygen Saturation (Admit) 98 %   Oxygen Saturation (Exercise) 100 %   Rating of Perceived Exertion (Exercise) 13   Perceived Dyspnea (Exercise) 2   Symptoms fatigue and SOB   Comments walk test results      Nutrition:  Target Goals: Understanding of nutrition guidelines, daily intake of sodium 1500mg , cholesterol 200mg , calories 30% from fat and 7% or less from saturated fats, daily to have 5 or more servings of fruits and vegetables.  Biometrics:     Pre Biometrics - 04/16/17 1453      Pre Biometrics   Height 5' 4.7" (1.643 m)   Weight 145 lb 6.4 oz (66 kg)   Waist Circumference 34.5 inches   Hip Circumference 38 inches   Waist to Hip Ratio 0.91 %   BMI (Calculated) 24.5   Single Leg Stand 5.83 seconds       Nutrition Therapy Plan and Nutrition Goals:     Nutrition Therapy & Goals - 04/16/17 1208  Nutrition Therapy   Drug/Food Interactions Statins/Certain Fruits     Intervention Plan   Expected Outcomes Short Term Goal: Understand basic principles of dietary content, such as calories, fat, sodium, cholesterol and nutrients.      Nutrition Discharge: Rate Your Plate Scores:     Nutrition Assessments - 04/16/17 1047      MEDFICTS Scores   Pre Score 31      Nutrition Goals Re-Evaluation:   Nutrition Goals Discharge (Final Nutrition Goals Re-Evaluation):   Psychosocial: Target Goals: Acknowledge presence or absence of significant depression and/or stress, maximize coping skills, provide positive  support system. Participant is able to verbalize types and ability to use techniques and skills needed for reducing stress and depression.   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       Quality of Life Scores:      Quality of Life - 04/16/17 1043      Quality of Life Scores   Health/Function Pre 16.96 %   Socioeconomic Pre 27.14 %   Psych/Spiritual Pre 29.29 %   Family Pre 30 %   GLOBAL Pre 23.71 %      PHQ-9: Recent Review Flowsheet Data    Depression screen [facility] 2/9 04/16/2017   Decreased Interest 0   Down, Depressed, Hopeless 0   PHQ - 2 Score 0   Altered sleeping 0   Tired, decreased energy 3   Change in appetite 2   Feeling bad or failure about yourself  0   Trouble concentrating 0   Moving slowly or fidgety/restless 0   Suicidal thoughts 0   PHQ-9 Score 5   Difficult doing work/chores Somewhat difficult     Interpretation of Total Score  Total Score Depression Severity:  1-4 = Minimal depression, 5-9 = Mild depression, 10-14 = Moderate depression, 15-19 = Moderately severe depression, 20-27 = Severe depression   Psychosocial Evaluation and Intervention:   Psychosocial Re-Evaluation:   Psychosocial Discharge (Final Psychosocial Re-Evaluation):   Vocational Rehabilitation: Provide vocational rehab assistance to qualifying candidates.   Vocational Rehab Evaluation & Intervention:     Vocational Rehab - 04/16/17 1047      Initial Vocational Rehab Evaluation & Intervention   Assessment shows need for Vocational Rehabilitation No      Education: Education Goals: Education classes will be provided on a weekly basis, covering required topics. Participant will state understanding/return demonstration of topics presented.  Learning Barriers/Preferences:     Learning Barriers/Preferences -  04/16/17 1048      Learning Barriers/Preferences   Learning Barriers Hearing   Learning Preferences Individual Instruction      Education Topics: General Nutrition Guidelines/Fats and Fiber: -Group instruction provided by verbal, written material, models and posters to present the general guidelines for heart healthy nutrition. Gives an explanation and review of dietary fats and fiber.   Controlling Sodium/Reading Food Labels: -Group verbal and written material supporting the discussion of sodium use in heart healthy nutrition. Review and explanation with models, verbal and written materials for utilization of the food label.   Exercise Physiology & Risk Factors: - Group verbal and written instruction with models to review the exercise physiology of the cardiovascular system and associated critical values. Details cardiovascular disease risk factors and the goals associated with each risk factor.  Aerobic Exercise & Resistance Training: - Gives group verbal and written discussion on the health impact of inactivity. On the components of aerobic and resistive training programs and the benefits of this training and how to safely progress through these programs.   Flexibility, Balance, General Exercise Guidelines: - Provides group verbal and written instruction on the benefits of flexibility and balance training programs. Provides general exercise guidelines with specific guidelines to those with heart or lung disease. Demonstration and skill practice provided.   Cardiac Rehab from 04/17/2017 in [facility] Cardiac and Pulmonary Rehab  Date  04/17/17  Educator  AS  Instruction Review Code  2- meets goals/outcomes      Stress Management: - Provides group verbal and written instruction about the health risks of elevated stress, cause of high stress, and healthy ways to reduce stress.   Depression: - Provides group verbal and written instruction on the correlation between heart/lung disease  and depressed mood, treatment options, and the stigmas associated with seeking treatment.   Anatomy & Physiology of the Heart: - Group verbal and written instruction and models provide basic cardiac anatomy and physiology, with the coronary electrical and arterial systems. Review of: AMI, Angina, Valve disease, Heart Failure, Cardiac Arrhythmia, Pacemakers, and the ICD.   Cardiac Procedures: - Group verbal and written instruction and models to describe the testing methods done to diagnose heart disease. Reviews the outcomes of the test results. Describes the treatment choices: Medical Management, Angioplasty, or Coronary Bypass Surgery.   Cardiac Medications: - Group verbal and written instruction to review commonly prescribed medications for heart disease. Reviews the medication, class of the drug, and side effects. Includes the steps to properly store meds and maintain the prescription regimen.   Go Sex-Intimacy & Heart Disease, Get SMART - Goal Setting: - Group verbal and written instruction through game format to discuss heart disease and the return to sexual intimacy. Provides group verbal and written material to discuss and apply goal setting through the application of the S.M.A.R.T. Method.   Other Matters of the Heart: - Provides group verbal, written materials and models to describe Heart Failure, Angina, Valve Disease, and Diabetes in the realm of heart disease. Includes description of the disease process and treatment options available to the cardiac patient.   Exercise & Equipment Safety: - Individual verbal instruction and demonstration of equipment use and safety with use of the equipment.   Cardiac Rehab from 04/17/2017 in [facility] Cardiac and Pulmonary Rehab  Date  04/16/17  Educator  C. EnterkinRN  Instruction Review Code  2- meets goals/outcomes      Infection Prevention: - Provides verbal and written material to individual with discussion of infection control including  proper hand washing and proper equipment cleaning during exercise session.   Cardiac Rehab from 04/17/2017 in [facility] Cardiac and Pulmonary Rehab  Date  04/16/17  Educator  C. EnterkinRN  Instruction Review Code  2- meets goals/outcomes      Falls Prevention: - Provides verbal and written material to individual with discussion of falls prevention and safety.   Cardiac Rehab from 04/17/2017 in [facility] Cardiac and Pulmonary Rehab  Date  04/16/17  Educator  C. Worden  Instruction Review Code  2- meets goals/outcomes      Diabetes: - Individual verbal and written instruction to review signs/symptoms of diabetes, desired ranges of glucose level fasting, after meals and with exercise. Advice that pre and post exercise glucose checks will be done for 3 sessions at entry of program.   Cardiac  Rehab from 04/17/2017 in [facility] Cardiac and Pulmonary Rehab  Date  04/16/17  Educator  C. Jimmie Molly [diet controlled diabetes-no medicines for it.]  Instruction Review Code  2- meets goals/outcomes       Knowledge Questionnaire Score:     Knowledge Questionnaire Score - 04/16/17 1045      Knowledge Questionnaire Score   Pre Score 26/28      Core Components/Risk Factors/Patient Goals at Admission:     Personal Goals and Risk Factors at Admission - 04/16/17 1208      Core Components/Risk Factors/Patient Goals on Admission    Weight Management Yes;Weight Maintenance   Intervention Weight Management: Develop a combined nutrition and exercise program designed to reach desired caloric intake, while maintaining appropriate intake of nutrient and fiber, sodium and fats, and appropriate energy expenditure required for the weight goal.;Weight Management: Provide education and appropriate resources to help participant work on and attain dietary goals.   Admit Weight 145 lb 6.4 oz (66 kg)   Goal Weight: Short Term 145 lb (65.8 kg)   Goal Weight: Long Term 142 lb (64.4 kg)   Expected Outcomes Long Term:  Adherence to nutrition and physical activity/exercise program aimed toward attainment of established weight goal;Weight Maintenance: Understanding of the daily nutrition guidelines, which includes 25-35% calories from fat, 7% or less cal from saturated fats, less than 200mg  cholesterol, less than 1.5gm of sodium, & 5 or more servings of fruits and vegetables daily;Short Term: Continue to assess and modify interventions until short term weight is achieved   Improve shortness of breath with ADL's Yes  c/o being tired also   Intervention Provide education, individualized exercise plan and daily activity instruction to help decrease symptoms of SOB with activities of daily living.   Expected Outcomes Short Term: Achieves a reduction of symptoms when performing activities of daily living.   Diabetes Yes   Intervention Provide education about signs/symptoms and action to take for hypo/hyperglycemia.;Provide education about proper nutrition, including hydration, and aerobic/resistive exercise prescription along with prescribed medications to achieve blood glucose in normal ranges: Fasting glucose 65-99 mg/dL   Expected Outcomes Short Term: Participant verbalizes understanding of the signs/symptoms and immediate care of hyper/hypoglycemia, proper foot care and importance of medication, aerobic/resistive exercise and nutrition plan for blood glucose control.;Long Term: Attainment of HbA1C < 7%.      Core Components/Risk Factors/Patient Goals Review:    Core Components/Risk Factors/Patient Goals at Discharge (Final Review):    ITP Comments:     ITP Comments    Row Name 04/16/17 1206 04/16/17 1214 04/18/17 0653       ITP Comments Medical Review ITP created during Orientation after Cardiac REhab informed consent was signed at 10:28am today. CABG x3 diagnosis documented in [facility] 03/17/2017 [facility] Encounter where her surgery was done.  Ellissa c/o her neck hurting when she lifts her arms up over her head. Mahitha  reports that she has been careful not to lift over 5lbs and her husband even weighed her purse which did not weigh more than 5 lbs. 30 day review. Continue with ITP unless directed changes per Medical Director review     New to program        Comments:

## 2017-04-19 ENCOUNTER — Encounter: Payer: Medicare Other | Admitting: *Deleted

## 2017-04-19 DIAGNOSIS — Z951 Presence of aortocoronary bypass graft: Secondary | ICD-10-CM

## 2017-04-19 LAB — GLUCOSE, CAPILLARY
GLUCOSE-CAPILLARY: 149 mg/dL — AB (ref 65–99)
Glucose-Capillary: 138 mg/dL — ABNORMAL HIGH (ref 65–99)

## 2017-04-19 NOTE — Progress Notes (Signed)
Daily Session Note  Patient Details  Name: Adriana Anderson MRN: [id number] Date of Birth: Jul 24, 1941 Referring Provider:     Cardiac Rehab from 04/16/2017 in [facility] Cardiac and Pulmonary Rehab  Referring Provider  Isaias Cowman MD      Encounter Date: 04/19/2017  Check In:     Session Check In - 04/19/17 6333      Check-In   Location [facility]-Cardiac & Pulmonary Rehab   Staff Present Alberteen Sam, MA, ACSM RCEP, Exercise Physiologist;Amanda Oletta Darter, BA, ACSM CEP, Exercise Physiologist;Krista Frederico Hamman, RN BSN   Supervising physician immediately available to respond to emergencies See telemetry face sheet for immediately available ER MD   Medication changes reported     No   Fall or balance concerns reported    No   Warm-up and Cool-down Performed on first and last piece of equipment   Resistance Training Performed Yes   VAD Patient? No     Pain Assessment   Currently in Pain? No/denies   Multiple Pain Sites No         History  Smoking Status  . Former Smoker  . Quit date: 04/20/1982  Smokeless Tobacco  . Never Used    Goals Met:  Exercise tolerated well No report of cardiac concerns or symptoms Strength training completed today  Goals Unmet:  Not Applicable  Comments: Pt able to follow exercise prescription today without complaint.  Will continue to monitor for progression.    Dr. Emily Filbert is Medical Director for [place] and [facility].

## 2017-04-24 DIAGNOSIS — Z951 Presence of aortocoronary bypass graft: Secondary | ICD-10-CM

## 2017-04-24 LAB — GLUCOSE, CAPILLARY
Glucose-Capillary: 193 mg/dL — ABNORMAL HIGH (ref 65–99)
Glucose-Capillary: 118 mg/dL — ABNORMAL HIGH (ref 65–99)

## 2017-04-24 NOTE — Progress Notes (Signed)
Daily Session Note  Patient Details  Name: Adriana Anderson MRN: [id number] Date of Birth: [birth year] Referring Provider:     Cardiac Rehab from 04/16/2017 in [facility] Cardiac and Pulmonary Rehab  Referring Provider  Isaias Cowman MD      Encounter Date: 04/24/2017  Check In:     Session Check In - 04/24/17 0901      Check-In   Location [facility]-Cardiac & Pulmonary Rehab   Staff Present Heath Lark, RN, BSN, CCRP;Jessica Acworth, MA, ACSM RCEP, Exercise Physiologist;Mackinsey Pelland Oletta Darter, BA, ACSM CEP, Exercise Physiologist   Supervising physician immediately available to respond to emergencies See telemetry face sheet for immediately available ER MD   Medication changes reported     No   Fall or balance concerns reported    No   Warm-up and Cool-down Performed on first and last piece of equipment   Resistance Training Performed Yes   VAD Patient? No     Pain Assessment   Currently in Pain? No/denies         History  Smoking Status  . Former Smoker  . Quit date: 04/20/1982  Smokeless Tobacco  . Never Used    Goals Met:  Independence with exercise equipment Exercise tolerated well No report of cardiac concerns or symptoms Strength training completed today  Goals Unmet:  Not Applicable  Comments: Pt able to follow exercise prescription today without complaint.  Will continue to monitor for progression.    Dr. Emily Filbert is Medical Director for [place] and [facility].

## 2017-04-26 ENCOUNTER — Other Ambulatory Visit: Payer: Self-pay

## 2017-04-26 MED ORDER — ESTRADIOL 0.1 MG/GM VA CREA: TOPICAL_CREAM | VAGINAL | 3 refills | Status: DC

## 2017-05-01 DIAGNOSIS — Z951 Presence of aortocoronary bypass graft: Secondary | ICD-10-CM | POA: Diagnosis not present

## 2017-05-01 NOTE — Progress Notes (Signed)
Daily Session Note  Patient Details  Name: Adriana Anderson MRN: [id number] Date of Birth: 01-10-1941 Referring Provider:     Cardiac Rehab from 04/16/2017 in [facility] Cardiac and Pulmonary Rehab  Referring Provider  Isaias Cowman MD      Encounter Date: 05/01/2017  Check In:     Session Check In - 05/01/17 0837      Check-In   Location [facility]-Cardiac & Pulmonary Rehab   Staff Present Gerlene Burdock, RN, Vickki Hearing, BA, ACSM CEP, Exercise Physiologist;Krista Frederico Hamman, RN BSN   Supervising physician immediately available to respond to emergencies See telemetry face sheet for immediately available ER MD   Medication changes reported     No   Fall or balance concerns reported    No   Warm-up and Cool-down Performed on first and last piece of equipment   Resistance Training Performed Yes   VAD Patient? No     Pain Assessment   Currently in Pain? No/denies         History  Smoking Status  . Former Smoker  . Quit date: 04/20/1982  Smokeless Tobacco  . Never Used    Goals Met:  Proper associated with RPD/PD & O2 Sat Exercise tolerated well No report of cardiac concerns or symptoms Strength training completed today  Goals Unmet:  Not Applicable  Comments:     Dr. Emily Filbert is Medical Director for [place] and [facility].

## 2017-05-02 ENCOUNTER — Encounter: Payer: Self-pay | Admitting: Urology

## 2017-05-02 ENCOUNTER — Ambulatory Visit: Payer: Medicare Other | Admitting: Urology

## 2017-05-03 ENCOUNTER — Ambulatory Visit (INDEPENDENT_AMBULATORY_CARE_PROVIDER_SITE_OTHER): Payer: Medicare Other | Admitting: Urology

## 2017-05-03 ENCOUNTER — Encounter: Payer: Self-pay | Admitting: Urology

## 2017-05-03 ENCOUNTER — Encounter: Payer: Medicare Other | Attending: Cardiology

## 2017-05-03 VITALS — BP 111/62 | HR 62 | Ht 64.0 in | Wt 153.4 lb

## 2017-05-03 DIAGNOSIS — Z87891 Personal history of nicotine dependence: Secondary | ICD-10-CM | POA: Diagnosis not present

## 2017-05-03 DIAGNOSIS — K219 Gastro-esophageal reflux disease without esophagitis: Secondary | ICD-10-CM | POA: Insufficient documentation

## 2017-05-03 DIAGNOSIS — Z79899 Other long term (current) drug therapy: Secondary | ICD-10-CM | POA: Insufficient documentation

## 2017-05-03 DIAGNOSIS — E119 Type 2 diabetes mellitus without complications: Secondary | ICD-10-CM | POA: Insufficient documentation

## 2017-05-03 DIAGNOSIS — C679 Malignant neoplasm of bladder, unspecified: Secondary | ICD-10-CM

## 2017-05-03 DIAGNOSIS — K769 Liver disease, unspecified: Secondary | ICD-10-CM | POA: Diagnosis not present

## 2017-05-03 DIAGNOSIS — Z951 Presence of aortocoronary bypass graft: Secondary | ICD-10-CM

## 2017-05-03 DIAGNOSIS — Z7901 Long term (current) use of anticoagulants: Secondary | ICD-10-CM | POA: Diagnosis not present

## 2017-05-03 DIAGNOSIS — Z7982 Long term (current) use of aspirin: Secondary | ICD-10-CM | POA: Insufficient documentation

## 2017-05-03 LAB — URINALYSIS, COMPLETE
BILIRUBIN UA: NEGATIVE
GLUCOSE, UA: NEGATIVE
KETONES UA: NEGATIVE
NITRITE UA: NEGATIVE
UUROB: 0.2 mg/dL (ref 0.2–1.0)
pH, UA: 6.5 (ref 5.0–7.5)

## 2017-05-03 LAB — MICROSCOPIC EXAMINATION

## 2017-05-03 MED ORDER — LIDOCAINE HCL 2 % EX GEL
1.0000 "application " | Freq: Once | CUTANEOUS | Status: AC
  Administered 2017-05-03: 1 via URETHRAL

## 2017-05-03 MED ORDER — CIPROFLOXACIN HCL 500 MG PO TABS
500.0000 mg | ORAL_TABLET | Freq: Once | ORAL | Status: AC
  Administered 2017-05-03: 500 mg via ORAL

## 2017-05-03 NOTE — Progress Notes (Signed)
Daily Session Note  Patient Details  Name: Adriana Anderson MRN: [id number] Date of Birth: Oct 12, 1940 Referring Provider:     Cardiac Rehab from 04/16/2017 in [facility] Cardiac and Pulmonary Rehab  Referring Provider  Isaias Cowman MD      Encounter Date: 05/03/2017  Check In:     Session Check In - 05/03/17 0844      Check-In   Location [facility]-Cardiac & Pulmonary Rehab   Staff Present Alberteen Sam, MA, ACSM RCEP, Exercise Physiologist;Arsalan Brisbin Oletta Darter, BA, ACSM CEP, Exercise Physiologist;Meredith Sherryll Burger, RN BSN   Supervising physician immediately available to respond to emergencies See telemetry face sheet for immediately available ER MD   Medication changes reported     No   Fall or balance concerns reported    No   Warm-up and Cool-down Performed on first and last piece of equipment   Resistance Training Performed Yes   VAD Patient? No     Pain Assessment   Currently in Pain? No/denies         History  Smoking Status  . Former Smoker  . Quit date: 04/20/1982  Smokeless Tobacco  . Never Used    Goals Met:  Proper associated with RPD/PD & O2 Sat Independence with exercise equipment Exercise tolerated well Strength training completed today  Goals Unmet:  Not Applicable  Comments: Pt able to follow exercise prescription today without complaint.  Will continue to monitor for progression.    Dr. Emily Filbert is Medical Director for [place] and [facility].

## 2017-05-03 NOTE — Progress Notes (Signed)
   05/03/17  CC:  Chief Complaint  Patient presents with  . 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 NED. A&Ox3.   No respiratory distress   Abd soft, NT, ND Normal external genitalia with patent urethral meatus  Cystoscopy Procedure Note  Patient identification was confirmed, informed consent was obtained, and patient was prepped using Betadine solution.  Lidocaine jelly was administered per urethral meatus.    Preoperative abx where received prior to procedure.    Procedure: - Flexible cystoscope introduced, without any difficulty.   - Thorough search of the bladder revealed:    normal urethral meatus    normal urothelium    no stones    no ulcers     no tumors    no urethral polyps    no trabeculation  - Ureteral orifices were normal in position and appearance.  Post-Procedure: - Patient tolerated the procedure well  Assessment/ Plan:  1. History of bladder cancer/CIS Cystoscopy was benign today. The patient is due for maintenance BCG. We'll have her undergo this followed by repeat cystoscopy in 3 months. I have also sent bladder washings for cytology.   Nickie Retort, MD

## 2017-05-08 ENCOUNTER — Encounter: Payer: Medicare Other | Admitting: *Deleted

## 2017-05-08 DIAGNOSIS — Z951 Presence of aortocoronary bypass graft: Secondary | ICD-10-CM | POA: Diagnosis not present

## 2017-05-08 NOTE — Progress Notes (Signed)
Daily Session Note  Patient Details  Name: ALYSEN SMYLIE MRN: [id number] Date of Birth: 22-Jun-1941 Referring Provider:     Cardiac Rehab from 04/16/2017 in [facility] Cardiac and Pulmonary Rehab  Referring Provider  Isaias Cowman MD      Encounter Date: 05/08/2017  Check In:     Session Check In - 05/08/17 8938      Check-In   Location [facility]-Cardiac & Pulmonary Rehab   Staff Present Heath Lark, RN, BSN, CCRP;Stacyann Mcconaughy Luan Pulling, MA, ACSM RCEP, Exercise Physiologist;Krista Frederico Hamman, RN BSN   Supervising physician immediately available to respond to emergencies See telemetry face sheet for immediately available ER MD   Medication changes reported     No   Fall or balance concerns reported    No   Warm-up and Cool-down Performed on first and last piece of equipment   Resistance Training Performed Yes   VAD Patient? No     Pain Assessment   Currently in Pain? No/denies   Multiple Pain Sites No         History  Smoking Status  . Former Smoker  . Quit date: 04/20/1982  Smokeless Tobacco  . Never Used    Goals Met:  Independence with exercise equipment Exercise tolerated well No report of cardiac concerns or symptoms Strength training completed today  Goals Unmet:  Not Applicable  Comments: Pt able to follow exercise prescription today without complaint.  Will continue to monitor for progression.    Dr. Emily Filbert is Medical Director for [place] and [facility].

## 2017-05-09 ENCOUNTER — Other Ambulatory Visit: Payer: Self-pay | Admitting: Urology

## 2017-05-10 ENCOUNTER — Encounter: Payer: Medicare Other | Admitting: *Deleted

## 2017-05-10 DIAGNOSIS — Z951 Presence of aortocoronary bypass graft: Secondary | ICD-10-CM

## 2017-05-10 NOTE — Progress Notes (Signed)
Daily Session Note  Patient Details  Name: Adriana Anderson MRN: [id number] Date of Birth: December 08, 1940 Referring Provider:     Cardiac Rehab from 04/16/2017 in [facility] Cardiac and Pulmonary Rehab  Referring Provider  Isaias Cowman MD      Encounter Date: 05/10/2017  Check In:     Session Check In - 05/10/17 0820      Check-In   Location [facility]-Cardiac & Pulmonary Rehab   Staff Present Alberteen Sam, MA, ACSM RCEP, Exercise Physiologist;Krista Frederico Hamman, RN BSN;Meredith Sherryll Burger, RN BSN   Supervising physician immediately available to respond to emergencies See telemetry face sheet for immediately available ER MD   Medication changes reported     No   Fall or balance concerns reported    No   Warm-up and Cool-down Performed on first and last piece of equipment   Resistance Training Performed Yes   VAD Patient? No     Pain Assessment   Currently in Pain? No/denies   Multiple Pain Sites No         History  Smoking Status  . Former Smoker  . Quit date: 04/20/1982  Smokeless Tobacco  . Never Used    Goals Met:  Independence with exercise equipment Exercise tolerated well No report of cardiac concerns or symptoms Strength training completed today  Goals Unmet:  Not Applicable  Comments: Pt able to follow exercise prescription today without complaint.  Will continue to monitor for progression.    Dr. Emily Filbert is Medical Director for [place] and [facility].

## 2017-05-15 ENCOUNTER — Encounter: Payer: Medicare Other | Admitting: *Deleted

## 2017-05-15 DIAGNOSIS — Z951 Presence of aortocoronary bypass graft: Secondary | ICD-10-CM

## 2017-05-15 NOTE — Progress Notes (Signed)
05/16/2017 4:21 PM   Adriana Anderson [birth year] [id number]  Referring provider: Madelyn Brunner, MD [facility] [place], [place] [address]  Chief Complaint  Patient presents with  . Bladder Cancer    HPI: Patient is a 76 year old Caucasian female who presents today for 1st of 3 maintenance BCG.    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 Jan 2018 mBCG x 04 May 2017 - cysto NED - cystology negative  Today, she states she is having frequency, urgency, nocturia and incontinence.  These are baseline.  She has been having nausea since her heart procedure two months ago.  She denies any gross hematuria.  She has not had recent fevers, chills or vomiting.  She has not had a new cough.  Her UA today noted > 30 WBC's and many bacteria.  She states that she has had nausea since her CABG.  She cannot tolerate her ODT due to the flavor.    PMH: Past Medical History:  Diagnosis Date  . Acid reflux   . Bladder cancer ([place])   . Bladder mass   . Diabetes mellitus, type 2 ([place])   . GERD (gastroesophageal reflux disease)   . History of biopsy of bladder   . History of cystoscopy   . Liver disease   . PONV (postoperative nausea and vomiting)     Surgical History: Past Surgical History:  Procedure Laterality Date  . APPENDECTOMY    . CYSTOSCOPY WITH BIOPSY N/A 11/15/2015   Procedure: CYSTOSCOPY WITH BIOPSY;  Surgeon: [name], MD;  Location: [facility] ORS;  Service: Urology;  Laterality: N/A;  . LEFT HEART CATH AND CORONARY ANGIOGRAPHY Left 02/23/2017   Procedure:  Left Heart Cath and Coronary Angiography;  Surgeon: Isaias Cowman, MD;  Location: [place] CV LAB;  Service: Cardiovascular;  Laterality: Left;  . TOE SURGERY      Home Medications:  Allergies as of 05/16/2017      Reactions   Tetanus-diphth-acell Pertussis Swelling   Sulfa Antibiotics Rash      Medication List       Accurate as of 05/16/17  4:21 PM. Always use your most recent med list.          amiodarone 200 MG tablet Commonly known as:  PACERONE Take 400mg  (2 tablets) twice a day for 6 days. Then reduced to 200mg  (1 tablet) a day starting 6/24.   apixaban 5 MG Tabs tablet Commonly known as:  ELIQUIS Take by mouth.   aspirin EC 81 MG tablet Take 81 mg by mouth at bedtime.   atorvastatin 40 MG tablet Commonly known as:  LIPITOR Take by mouth.   CALTRATE 600+D PO Take 1 tablet by mouth 2 (two) times daily.   estradiol 0.1 MG/GM vaginal cream Commonly known as:  ESTRACE VAGINAL USE 1 APPLICATION VAGINALLY ONCE A WEEK   loratadine 10 MG tablet Commonly known as:  CLARITIN Take 10 mg by mouth daily as needed for allergies.   metoprolol tartrate 25 MG tablet Commonly known as:  LOPRESSOR Take by  mouth.   omeprazole-sodium bicarbonate 40-1100 MG capsule Commonly known as:  ZEGERID Take 1 capsule by mouth daily.   ondansetron 4 MG disintegrating tablet Commonly known as:  ZOFRAN ODT Take 1 tablet (4 mg total) by mouth every 8 (eight) hours as needed for nausea or vomiting.   ondansetron 4 MG tablet Commonly known as:  ZOFRAN Take 1 tablet (4 mg total) by mouth every 8 (eight) hours as needed for nausea or vomiting.   polyethylene glycol powder powder Commonly known as:  GLYCOLAX/MIRALAX Take 17 g by mouth at bedtime.   PROBIOTIC DAILY PO Take 1 capsule by mouth daily.   ranitidine 150 MG tablet Commonly known as:  ZANTAC Take 150 mg by mouth at bedtime as needed for heartburn.   solifenacin 10 MG tablet Commonly known as:  VESICARE Take 1  tablet (10 mg total) by mouth daily.   TUMS EXTRA STRENGTH 750 750 MG chewable tablet Generic drug:  calcium carbonate Chew 2,250-3,750 tablets by mouth at bedtime as needed for heartburn.   URIBEL 118 MG Caps Take 1 capsule (118 mg total) by mouth QID.       Allergies:  Allergies  Allergen Reactions  . Tetanus-Diphth-Acell Pertussis Swelling  . Sulfa Antibiotics Rash    Family History: Family History  Problem Relation Age of Onset  . Bladder Cancer Brother        x 3   . Brain cancer Father   . Kidney disease Father   . Diabetes Unknown   . Prostate cancer Neg Hx     Social History:  reports that she quit smoking about 35 years ago. She has never used smokeless tobacco. She reports that she drinks alcohol. She reports that she does not use drugs.  ROS: UROLOGY Frequent Urination?: Yes Hard to postpone urination?: Yes Burning/pain with urination?: No Get up at night to urinate?: Yes Leakage of urine?: Yes Urine stream starts and stops?: No Trouble starting stream?: No Do you have to strain to urinate?: No Blood in urine?: No Urinary tract infection?: Yes Sexually transmitted disease?: No Injury to kidneys or bladder?: No Painful intercourse?: No Weak stream?: No Currently pregnant?: No Vaginal bleeding?: No Last menstrual period?: n  Gastrointestinal Nausea?: Yes Vomiting?: No Indigestion/heartburn?: No Diarrhea?: No Constipation?: Yes  Constitutional Fever: No Night sweats?: No Weight loss?: Yes Fatigue?: No  Skin Skin rash/lesions?: No Itching?: No  Eyes Blurred vision?: No Double vision?: No  Ears/Nose/Throat Sore throat?: No Sinus problems?: No  Hematologic/Lymphatic Swollen glands?: No Easy bruising?: Yes  Cardiovascular Leg swelling?: No Chest pain?: No  Respiratory Cough?: Yes Shortness of breath?: No  Endocrine Excessive thirst?: No  Musculoskeletal Back pain?: Yes Joint pain?: No  Neurological Headaches?:  No Dizziness?: No  Psychologic Depression?: No Anxiety?: No  Physical Exam: BP 113/65 (BP Location: Left Arm, Patient Position: Sitting, Cuff Size: Normal)   Pulse 67   Ht 5\' 4"  (1.626 m)   Wt 149 lb (67.6 kg)   BMI 25.58 kg/m   Constitutional: Well nourished. Alert and oriented, No acute distress. HEENT: [place] AT, moist mucus membranes. Trachea midline, no masses. Cardiovascular: No clubbing, cyanosis, or edema. Respiratory: Normal respiratory effort, no increased work of breathing. GI: Abdomen is soft, non tender, non distended, no abdominal masses. Liver and spleen not palpable.  No hernias appreciated.  Stool sample for occult testing is not indicated.   GU: No CVA tenderness.  No bladder fullness or masses.   Skin: No rashes, bruises or suspicious lesions.  Lymph: No cervical or inguinal adenopathy. Neurologic: Grossly intact, no focal deficits, moving all 4 extremities. Psychiatric: Normal mood and affect.  Laboratory Data: Urinalysis > 30 WBC's.  Many bacteria.  See EPIC.   Assessment & Plan:    1. Malignant neoplasm of urinary bladder, unspecified site ([place])  - Reviewed BCG treatment course, possible side effects including BCG sepsis, bladder irritation, worsening of her urinary symptoms  - # 1 of 3 maintenance BCG installed today  - Patient was instructed to pour bleach down her toilet for the next 6 hours  -  Instructed to call the office if she should experience fevers greater than 102, chills/rigors, onset of a new cough, night sweats or further bladder spasms or inability to urinate   - RTC in one week for # 2 maintenance BCG  - Urinalysis, Complete  - bcg vaccine injection 81 mg; Instill 3.24 mLs (81 mg total) into the bladder once.  - lidocaine (XYLOCAINE) 2 % jelly 1 application; Place 1 application into the urethra once.   2. CIS  - see above  - if patient has reoccurrence, cystectomy is recommended  3. Nausea  - prescribed Zofran tablet to swallow not  the ODT as she is not tolerating the ODT   Return in about 1 week (around 05/23/2017) for # 2 BCG.  These notes generated with voice recognition software. I apologize for typographical errors.  Zara Council, [facility] [address] [place], [place] [address] [phone]

## 2017-05-15 NOTE — Progress Notes (Signed)
Daily Session Note  Patient Details  Name: Adriana Anderson MRN: [id number] Date of Birth: 06-04-1941 Referring Provider:     Cardiac Rehab from 04/16/2017 in [facility] Cardiac and Pulmonary Rehab  Referring Provider  Isaias Cowman MD      Encounter Date: 05/15/2017  Check In:     Session Check In - 05/15/17 0828      Check-In   Location [facility]-Cardiac & Pulmonary Rehab   Staff Present Heath Lark, RN, BSN, CCRP;Jessica Luan Pulling, MA, ACSM RCEP, Exercise Physiologist;Amanda Oletta Darter, [place], ACSM CEP, Exercise Physiologist   Supervising physician immediately available to respond to emergencies See telemetry face sheet for immediately available ER MD   Medication changes reported     No   Fall or balance concerns reported    No   Warm-up and Cool-down Performed on first and last piece of equipment   Resistance Training Performed Yes   VAD Patient? No     Pain Assessment   Currently in Pain? No/denies   Multiple Pain Sites No         History  Smoking Status  . Former Smoker  . Quit date: 04/20/1982  Smokeless Tobacco  . Never Used    Goals Met:  Independence with exercise equipment Exercise tolerated well No report of cardiac concerns or symptoms Strength training completed today  Goals Unmet:  Not Applicable  Comments: Pt able to follow exercise prescription today without complaint.  Will continue to monitor for progression. Reviewed home exercise with pt today.  Pt plans to walk at home for exercise.  Reviewed THR, pulse, RPE, sign and symptoms, NTG use, and when to call 911 or MD.  Also discussed weather considerations and indoor options.  Pt voiced understanding.    Dr. Emily Filbert is Medical Director for [place] and [facility].

## 2017-05-16 ENCOUNTER — Encounter: Payer: Self-pay | Admitting: Urology

## 2017-05-16 ENCOUNTER — Encounter: Payer: Self-pay | Admitting: *Deleted

## 2017-05-16 ENCOUNTER — Ambulatory Visit: Payer: Medicare Other | Admitting: Urology

## 2017-05-16 VITALS — BP 113/65 | HR 67 | Temp 98.3°F | Ht 64.0 in | Wt 149.0 lb

## 2017-05-16 DIAGNOSIS — D090 Carcinoma in situ of bladder: Secondary | ICD-10-CM | POA: Diagnosis not present

## 2017-05-16 DIAGNOSIS — C679 Malignant neoplasm of bladder, unspecified: Secondary | ICD-10-CM

## 2017-05-16 DIAGNOSIS — R110 Nausea: Secondary | ICD-10-CM

## 2017-05-16 DIAGNOSIS — Z951 Presence of aortocoronary bypass graft: Secondary | ICD-10-CM

## 2017-05-16 LAB — MICROSCOPIC EXAMINATION: RBC, UA: NONE SEEN /hpf (ref 0–?)

## 2017-05-16 LAB — URINALYSIS, COMPLETE
Bilirubin, UA: NEGATIVE
Glucose, UA: NEGATIVE
Ketones, UA: NEGATIVE
Nitrite, UA: NEGATIVE
PH UA: 5.5 (ref 5.0–7.5)
PROTEIN UA: NEGATIVE
Specific Gravity, UA: <1.005 — ABNORMAL LOW (ref 1.005–1.030)
UUROB: 0.2 mg/dL (ref 0.2–1.0)

## 2017-05-16 MED ORDER — ONDANSETRON 4 MG PO TBDP: 4.0000 mg | ORAL_TABLET | Freq: Three times a day (TID) | ORAL | 0 refills | Status: DC | PRN

## 2017-05-16 MED ORDER — BCG LIVE 50 MG IS SUSR
3.2400 mL | Freq: Once | INTRAVESICAL | Status: AC
  Administered 2017-05-16: 81 mg via INTRAVESICAL

## 2017-05-16 MED ORDER — ONDANSETRON HCL 4 MG PO TABS: 4.0000 mg | ORAL_TABLET | Freq: Three times a day (TID) | ORAL | 0 refills | Status: DC | PRN

## 2017-05-16 NOTE — Progress Notes (Signed)
Cardiac Individual Treatment Plan  Patient Details  Name: Adriana Anderson MRN: [id number] Date of Birth: 01/06/1941 Referring Provider:     Cardiac Rehab from 04/16/2017 in [facility] Cardiac and Pulmonary Rehab  Referring Provider  Isaias Cowman MD      Initial Encounter Date:    Cardiac Rehab from 04/16/2017 in [facility] Cardiac and Pulmonary Rehab  Date  04/16/17  Referring Provider  Isaias Cowman MD      Visit Diagnosis: S/P CABG x 3  Patient's Home Medications on Admission:  Current Outpatient Prescriptions:  .  amiodarone (PACERONE) 200 MG tablet, Take 423m (2 tablets) twice a day for 6 days. Then reduced to 2017m(1 tablet) a day starting 6/24., Disp: , Rfl:  .  apixaban (ELIQUIS) 5 MG TABS tablet, Take by mouth., Disp: , Rfl:  .  aspirin EC 81 MG tablet, Take 81 mg by mouth at bedtime., Disp: , Rfl:  .  atorvastatin (LIPITOR) 40 MG tablet, Take by mouth., Disp: , Rfl:  .  calcium carbonate (TUMS EXTRA STRENGTH 750) 750 MG chewable tablet, Chew 2,250-3,750 tablets by mouth at bedtime as needed for heartburn., Disp: , Rfl:  .  Calcium Carbonate-Vitamin D (CALTRATE 600+D PO), Take 1 tablet by mouth 2 (two) times daily., Disp: , Rfl:  .  estradiol (ESTRACE VAGINAL) 0.1 MG/GM vaginal cream, USE 1 APPLICATION VAGINALLY ONCE A WEEK, Disp: 42.5 g, Rfl: 3 .  loratadine (CLARITIN) 10 MG tablet, Take 10 mg by mouth daily as needed for allergies., Disp: , Rfl:  .  Meth-Hyo-M Bl-Na Phos-Ph Sal (URIBEL) 118 MG CAPS, Take 1 capsule (118 mg total) by mouth QID. (Patient taking differently: Take 1 capsule by mouth 4 (four) times daily as needed (bladder problems). ), Disp: 40 capsule, Rfl: 3 .  metoprolol tartrate (LOPRESSOR) 25 MG tablet, Take by mouth., Disp: , Rfl:  .  omeprazole-sodium bicarbonate (ZEGERID) 40-1100 MG capsule, Take 1 capsule by mouth daily. , Disp: , Rfl:  .  polyethylene glycol powder (GLYCOLAX/MIRALAX) powder, Take 17 g by mouth at bedtime., Disp: , Rfl:  .  Probiotic  Product (PROBIOTIC DAILY PO), Take 1 capsule by mouth daily., Disp: , Rfl:  .  ranitidine (ZANTAC) 150 MG tablet, Take 150 mg by mouth at bedtime as needed for heartburn., Disp: , Rfl:  .  solifenacin (VESICARE) 10 MG tablet, Take 1 tablet (10 mg total) by mouth daily. (Patient not taking: Reported on 04/16/2017), Disp: 90 tablet, Rfl: 2  Past Medical History: Past Medical History:  Diagnosis Date  . Acid reflux   . Bladder cancer ([place]  . Bladder mass   . Diabetes mellitus, type 2 ([place]  . GERD (gastroesophageal reflux disease)   . History of biopsy of bladder   . History of cystoscopy   . Liver disease   . PONV (postoperative nausea and vomiting)     Tobacco Use: History  Smoking Status  . Former Smoker  . Quit date: 04/20/1982  Smokeless Tobacco  . Never Used    Labs: Recent Review Flowsheet Data    There is no flowsheet data to display.       Exercise Target Goals:    Exercise Program Goal: Individual exercise prescription set with THRR, safety & activity barriers. Participant demonstrates ability to understand and report RPE using BORG scale, to self-measure pulse accurately, and to acknowledge the importance of the exercise prescription.  Exercise Prescription Goal: Starting with aerobic activity 30 plus minutes a day, 3 days per week  for initial exercise prescription. Provide home exercise prescription and guidelines that participant acknowledges understanding prior to discharge.  Activity Barriers & Risk Stratification:     Activity Barriers & Cardiac Risk Stratification - 04/16/17 1048      Activity Barriers & Cardiac Risk Stratification   Activity Barriers Back Problems;Deconditioning;Muscular Weakness;Shortness of Breath;Balance Concerns;Neck/Spine Problems  low back pain, when she reaches up she has neck pain bilaterally   Cardiac Risk Stratification High      6 Minute Walk:     6 Minute Walk    Row Name 04/16/17 1444         6 Minute Walk    Phase Initial     Distance 930 feet     Walk Time 5.8 minutes     # of Rest Breaks 1  12 sec     MPH 1.82     METS 1.82     RPE 13     Perceived Dyspnea  2     VO2 Peak 6.39     Symptoms Yes (comment)     Comments fatigue and SOB     Resting HR 64 bpm     Resting BP 128/78     Max Ex. HR 72 bpm     Max Ex. BP 146/76     2 Minute Post BP 132/72        Oxygen Initial Assessment:     Oxygen Initial Assessment - 04/16/17 1208      Home Oxygen   Home Oxygen Device None     Initial 6 min Walk   Oxygen Used None      Oxygen Re-Evaluation:   Oxygen Discharge (Final Oxygen Re-Evaluation):   Initial Exercise Prescription:     Initial Exercise Prescription - 04/16/17 1400      Date of Initial Exercise RX and Referring Provider   Date 04/16/17   Referring Provider Paraschos, Alexander MD     Treadmill   MPH 1.1   Grade 0   Minutes 15   METs 1.84     NuStep   Level 1   SPM 80   Minutes 15   METs 1.8     Biostep-RELP   Level 1   SPM 50   Minutes 15   METs 2     Prescription Details   Frequency (times per week) 2   Duration Progress to 45 minutes of aerobic exercise without signs/symptoms of physical distress     Intensity   THRR 40-80% of Max Heartrate 96-129   Ratings of Perceived Exertion 11-13   Perceived Dyspnea 0-4     Progression   Progression Continue to progress workloads to maintain intensity without signs/symptoms of physical distress.     Resistance Training   Training Prescription Yes   Weight 2 lbs   Reps 10-15      Perform Capillary Blood Glucose checks as needed.  Exercise Prescription Changes:     Exercise Prescription Changes    Row Name 04/16/17 1400 04/25/17 1400 05/10/17 1500 05/15/17 0900       Response to Exercise   Blood Pressure (Admit) 128/78 126/70 130/66  -    Blood Pressure (Exercise) 146/76 120/70 130/66  -    Blood Pressure (Exit) 132/72 124/58 122/66  -    Heart Rate (Admit) 64 bpm 68 bpm 65 bpm  -     Heart Rate (Exercise) 72 bpm 92 bpm 96 bpm  -    Heart Rate (Exit) 59 bpm  57 bpm 62 bpm  -    Oxygen Saturation (Admit) 98 %  -  -  -    Oxygen Saturation (Exercise) 100 %  -  -  -    Rating of Perceived Exertion (Exercise) _0 -    Perceived Dyspnea (Exercise) 2 2  -  -    Symptoms fatigue and SOB none none  -    Comments walk test results  -  -  -    Duration  - Progress to 45 minutes of aerobic exercise without signs/symptoms of physical distress Continue with 45 min of aerobic exercise without signs/symptoms of physical distress.  -    Intensity  - THRR unchanged THRR unchanged  -      Progression   Progression  - Continue to progress workloads to maintain intensity without signs/symptoms of physical distress. Continue to progress workloads to maintain intensity without signs/symptoms of physical distress.  -    Average METs  - 2.08 2.09  -      Resistance Training   Training Prescription  - Yes Yes  -    Weight  - 2 lbs 2 lbs  -    Reps  - 10-15 10-15  -      Interval Training   Interval Training  - No No  -      Treadmill   MPH  - 1.5 2  -    Grade  - 0.5 0.5  -    Minutes  - 15 15  -    METs  - 2.25 2.67  -      NuStep   Level  - 1 1  -    Minutes  - 15 15  -    METs  - 2 1.9  -      Biostep-RELP   Level  - 1 1  -    Minutes  - 15 15  -    METs  - 2 2  -      Home Exercise Plan   Plans to continue exercise at  -  -  - Home (comment)  walking    Frequency  -  -  - Add 3 additional days to program exercise sessions.    Initial Home Exercises Provided  -  -  - 05/15/17       Exercise Comments:     Exercise Comments    Row Name 04/17/17 0951           Exercise Comments First full day of exercise!  Patient was oriented to gym and equipment including functions, settings, policies, and procedures.  Patient's individual exercise prescription and treatment plan were reviewed.  All starting workloads were established based on the results of the 6 minute walk  test done at initial orientation visit.  The plan for exercise progression was also introduced and progression will be customized based on patient's performance and goals.          Exercise Goals and Review:     Exercise Goals    Row Name 04/16/17 1453             Exercise Goals   Increase Physical Activity Yes       Intervention Provide advice, education, support and counseling about physical activity/exercise needs.;Develop an individualized exercise prescription for aerobic and resistive training based on initial evaluation findings, risk stratification, comorbidities and participant's personal goals.       Expected Outcomes Achievement  of increased cardiorespiratory fitness and enhanced flexibility, muscular endurance and strength shown through measurements of functional capacity and personal statement of participant.       Increase Strength and Stamina Yes       Intervention Provide advice, education, support and counseling about physical activity/exercise needs.;Develop an individualized exercise prescription for aerobic and resistive training based on initial evaluation findings, risk stratification, comorbidities and participant's personal goals.       Expected Outcomes Achievement of increased cardiorespiratory fitness and enhanced flexibility, muscular endurance and strength shown through measurements of functional capacity and personal statement of participant.          Exercise Goals Re-Evaluation :     Exercise Goals Re-Evaluation    Row Name 04/25/17 1441 05/10/17 1542 05/15/17 0946         Exercise Goal Re-Evaluation   Exercise Goals Review Increase Strenth and Stamina;Increase Physical Activity Increase Strenth and Stamina;Increase Physical Activity Increase Strenth and Stamina;Increase Physical Activity     Comments Adriana Anderson is off to a good start in rehab.  She has completed 3 full days of exercise.  She is eager to get moving and has already noticed things getting  easier.  We will continue to monitor her progression. Lyndzie continues to do well in rehab.  She felt a little more nauseated than normal today.  She did back off some of her workloads todays.  We will continue to monitor her progress.  Reviewed home exercise with pt today.  Pt plans to walk at home for exercise.  Reviewed THR, pulse, RPE, sign and symptoms, NTG use, and when to call 911 or MD.  Also discussed weather considerations and indoor options.  Pt voiced understanding.     Expected Outcomes Short: Move up workloads on all pieces.  Long: Continue to exercise on her own at home. Short: Move up workloads.  Long: Continue to walk more at home.  Short: Add in home exercise.  Long: Walk more regularly.        Discharge Exercise Prescription (Final Exercise Prescription Changes):     Exercise Prescription Changes - 05/15/17 0900      Home Exercise Plan   Plans to continue exercise at Home (comment)  walking   Frequency Add 3 additional days to program exercise sessions.   Initial Home Exercises Provided 05/15/17      Nutrition:  Target Goals: Understanding of nutrition guidelines, daily intake of sodium <1525m, cholesterol <2025m calories 30% from fat and 7% or less from saturated fats, daily to have 5 or more servings of fruits and vegetables.  Biometrics:     Pre Biometrics - 04/16/17 1453      Pre Biometrics   Height 5' 4.7" (1.643 m)   Weight 145 lb 6.4 oz (66 kg)   Waist Circumference 34.5 inches   Hip Circumference 38 inches   Waist to Hip Ratio 0.91 %   BMI (Calculated) 24.5   Single Leg Stand 5.83 seconds       Nutrition Therapy Plan and Nutrition Goals:     Nutrition Therapy & Goals - 04/16/17 1208      Nutrition Therapy   Drug/Food Interactions Statins/Certain Fruits     Intervention Plan   Expected Outcomes Short Term Goal: Understand basic principles of dietary content, such as calories, fat, sodium, cholesterol and nutrients.      Nutrition Discharge:  Rate Your Plate Scores:     Nutrition Assessments - 04/16/17 1047      MEDFICTS Scores  Pre Score 31      Nutrition Goals Re-Evaluation:   Nutrition Goals Discharge (Final Nutrition Goals Re-Evaluation):   Psychosocial: Target Goals: Acknowledge presence or absence of significant depression and/or stress, maximize coping skills, provide positive support system. Participant is able to verbalize types and ability to use techniques and skills needed for reducing stress and depression.   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       Quality of Life Scores:      Quality of Life - 04/16/17 1043      Quality of Life Scores   Health/Function Pre 16.96 %   Socioeconomic Pre 27.14 %   Psych/Spiritual Pre 29.29 %   Family Pre 30 %   GLOBAL Pre 23.71 %      PHQ-9: Recent Review Flowsheet Data    Depression screen [facility] 2/9 04/16/2017   Decreased Interest 0   Down, Depressed, Hopeless 0   PHQ - 2 Score 0   Altered sleeping 0   Tired, decreased energy 3   Change in appetite 2   Feeling bad or failure about yourself  0   Trouble concentrating 0   Moving slowly or fidgety/restless 0   Suicidal thoughts 0   PHQ-9 Score 5   Difficult doing work/chores Somewhat difficult     Interpretation of Total Score  Total Score Depression Severity:  1-4 = Minimal depression, 5-9 = Mild depression, 10-14 = Moderate depression, 15-19 = Moderately severe depression, 20-27 = Severe depression   Psychosocial Evaluation and Intervention:     Psychosocial Evaluation - 05/03/17 0925      Psychosocial Evaluation & Interventions   Interventions Encouraged to exercise with the program and follow exercise prescription;Relaxation education;Stress management education   Comments Counselor met with Ms. Lung Adriana Roys) today for  initial psychosocial evaluation.  She is a 76 year old who had a CABGx3 on June 6th.  Melonee has a strong support system with a spouse of 60 years; (2) adult daughters who live close by and a daughter in [place] who is a Marine scientist checks in on [place] frequently.  Jalyric was diagnosed with bladder cancer 2 years ago and is currently cancer free.  She sleeps well and reports her appetite has been "terrible" since the surgery with increased nausea.  She is taking something for that every evening and states it helps somewhat.  Jeidy denies a history of depression or anxiety or any current symptoms.  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  Counselor encouraged Ambar to "delegate" better with family members for some of these tasks until Huberta is able to do them herself.  She has goals to get her strength back while in this program.  Staff will follow with Adriana Roys.   Expected Outcomes Rosmery will benefit from consistent exercise to achieve her stated goals.  The educational and psychoeducational components of this class will be helpful in understanding and managing her diagnoses better.  Demiya will benefit especially from the stress management education to learn improved self care in order to be more assertive in accepting her limitations and asking for help.        Psychosocial Re-Evaluation:  Psychosocial Re-Evaluation    Row Name 05/01/17 [phone]             Psychosocial Re-Evaluation   Current issues with Current Stress Concerns       Comments Shatonya and her husband are still busy on their farm plus she is attending [facility] twice a week or trying to.        Interventions Encouraged to attend Cardiac Rehabilitation for the exercise       Comments Icess wants to work in her yard and hopes to now when rain stops since she said since working out in [place] that she should be able to work out in her yard also  plus the farm.         Initial Review   Source of Stress Concerns Unable to perform yard/household activities          Psychosocial Discharge (Final Psychosocial Re-Evaluation):     Psychosocial Re-Evaluation - 05/01/17 9244      Psychosocial Re-Evaluation   Current issues with Current Stress Concerns   Comments Arnesia and her husband are still busy on their farm plus she is attending [facility] twice a week or trying to.    Interventions Encouraged to attend Cardiac Rehabilitation for the exercise   Comments Ellowyn wants to work in her yard and hopes to now when rain stops since she said since working out in [place] that she should be able to work out in her yard also plus the farm.     Initial Review   Source of Stress Concerns Unable to perform yard/household activities      Vocational Rehabilitation: Provide vocational rehab assistance to qualifying candidates.   Vocational Rehab Evaluation & Intervention:     Vocational Rehab - 04/16/17 1047      Initial Vocational Rehab Evaluation & Intervention   Assessment shows need for Vocational Rehabilitation No      Education: Education Goals: Education classes will be provided on a weekly basis, covering required topics. Participant will state understanding/return demonstration of topics presented.  Learning Barriers/Preferences:     Learning Barriers/Preferences - 04/16/17 1048      Learning Barriers/Preferences   Learning Barriers Hearing   Learning Preferences Individual Instruction      Education Topics: General Nutrition Guidelines/Fats and Fiber: -Group instruction provided by verbal, written material, models and posters to present the general guidelines for heart healthy nutrition. Gives an explanation and review of dietary fats and fiber.   Controlling Sodium/Reading Food Labels: -Group verbal and written material supporting the discussion of sodium use in heart healthy nutrition. Review and  explanation with models, verbal and written materials for utilization of the food label.   Exercise Physiology & Risk Factors: - Group verbal and written instruction with models to review the exercise physiology of the cardiovascular system and associated critical values. Details cardiovascular disease risk factors and the goals associated with each risk factor.   Aerobic Exercise & Resistance Training: - Gives group verbal and written discussion on the health impact of inactivity. On the components of aerobic and resistive training programs and the benefits of this training and how to safely progress through these programs.   Flexibility, Balance, General Exercise Guidelines: - Provides group verbal and written instruction on the benefits of flexibility and balance training programs. Provides general exercise guidelines with specific guidelines to those with heart or lung disease. Demonstration and skill practice provided.   Cardiac Rehab from 05/15/2017 in [facility] Cardiac and Pulmonary Rehab  Date  04/17/17  Educator  AS  Instruction Review Code  2- meets goals/outcomes      Stress Management: - Provides group verbal and written instruction about the health risks of elevated stress, cause of high stress, and healthy ways to reduce stress.   Cardiac Rehab from 05/15/2017 in [facility] Cardiac and Pulmonary Rehab  Date  04/24/17  Educator  [facility]  Instruction Review Code  2- meets goals/outcomes      Depression: - Provides group verbal and written instruction on the correlation between heart/lung disease and depressed mood, treatment options, and the stigmas associated with seeking treatment.   Anatomy & Physiology of the Heart: - Group verbal and written instruction and models provide basic cardiac anatomy and physiology, with the coronary electrical and arterial systems. Review of: AMI, Angina, Valve disease, Heart Failure, Cardiac Arrhythmia, Pacemakers, and the ICD.   Cardiac Procedures: -  Group verbal and written instruction and models to describe the testing methods done to diagnose heart disease. Reviews the outcomes of the test results. Describes the treatment choices: Medical Management, Angioplasty, or Coronary Bypass Surgery.   Cardiac Rehab from 05/15/2017 in [facility] Cardiac and Pulmonary Rehab  Date  05/01/17  Educator  CE  Instruction Review Code  2- meets goals/outcomes      Cardiac Medications: - Group verbal and written instruction to review commonly prescribed medications for heart disease. Reviews the medication, class of the drug, and side effects. Includes the steps to properly store meds and maintain the prescription regimen.   Cardiac Rehab from 05/15/2017 in [facility] Cardiac and Pulmonary Rehab  Date  05/08/17 [8/7 Part One 8/9 Part Two]  Educator  SB  Instruction Review Code  2- meets goals/outcomes      Go Sex-Intimacy & Heart Disease, Get SMART - Goal Setting: - Group verbal and written instruction through game format to discuss heart disease and the return to sexual intimacy. Provides group verbal and written material to discuss and apply goal setting through the application of the S.M.A.R.T. Method.   Cardiac Rehab from 05/15/2017 in [facility] Cardiac and Pulmonary Rehab  Date  05/01/17  Educator  CE  Instruction Review Code  2- meets goals/outcomes      Other Matters of the Heart: - Provides group verbal, written materials and models to describe Heart Failure, Angina, Valve Disease, and Diabetes in the realm of heart disease. Includes description of the disease process and treatment options available to the cardiac patient.   Exercise & Equipment Safety: - Individual verbal instruction and demonstration of equipment use and safety with use of the equipment.   Cardiac Rehab from 05/15/2017 in [facility] Cardiac and Pulmonary Rehab  Date  04/16/17  Educator  C. EnterkinRN  Instruction Review Code  2- meets goals/outcomes      Infection Prevention: - Provides  verbal and written material to individual with discussion of infection control including proper hand washing and proper equipment cleaning during exercise session.   Cardiac Rehab from 05/15/2017 in [facility] Cardiac and Pulmonary Rehab  Date  04/16/17  Educator  C. EnterkinRN  Instruction Review Code  2- meets goals/outcomes      Falls Prevention: - Provides verbal and written material to individual with discussion of falls prevention and safety.   Cardiac Rehab from 05/15/2017 in [facility] Cardiac and Pulmonary Rehab  Date  04/16/17  Educator  C. EnterkinRN  Instruction Review Code  2- meets goals/outcomes      Diabetes: - Individual verbal and written instruction to review signs/symptoms  of diabetes, desired ranges of glucose level fasting, after meals and with exercise. Advice that pre and post exercise glucose checks will be done for 3 sessions at entry of program.   Cardiac Rehab from 05/15/2017 in [facility] Cardiac and Pulmonary Rehab  Date  04/16/17  Educator  C. Jimmie Molly [diet controlled diabetes-no medicines for it.]  Instruction Review Code  2- meets goals/outcomes       Knowledge Questionnaire Score:     Knowledge Questionnaire Score - 04/16/17 1045      Knowledge Questionnaire Score   Pre Score 26/28      Core Components/Risk Factors/Patient Goals at Admission:     Personal Goals and Risk Factors at Admission - 04/16/17 1208      Core Components/Risk Factors/Patient Goals on Admission    Weight Management Yes;Weight Maintenance   Intervention Weight Management: Develop a combined nutrition and exercise program designed to reach desired caloric intake, while maintaining appropriate intake of nutrient and fiber, sodium and fats, and appropriate energy expenditure required for the weight goal.;Weight Management: Provide education and appropriate resources to help participant work on and attain dietary goals.   Admit Weight 145 lb 6.4 oz (66 kg)   Goal Weight: Short Term 145  lb (65.8 kg)   Goal Weight: Long Term 142 lb (64.4 kg)   Expected Outcomes Long Term: Adherence to nutrition and physical activity/exercise program aimed toward attainment of established weight goal;Weight Maintenance: Understanding of the daily nutrition guidelines, which includes 25-35% calories from fat, 7% or less cal from saturated fats, less than 270m cholesterol, less than 1.5gm of sodium, & 5 or more servings of fruits and vegetables daily;Short Term: Continue to assess and modify interventions until short term weight is achieved   Improve shortness of breath with ADL's Yes  c/o being tired also   Intervention Provide education, individualized exercise plan and daily activity instruction to help decrease symptoms of SOB with activities of daily living.   Expected Outcomes Short Term: Achieves a reduction of symptoms when performing activities of daily living.   Diabetes Yes   Intervention Provide education about signs/symptoms and action to take for hypo/hyperglycemia.;Provide education about proper nutrition, including hydration, and aerobic/resistive exercise prescription along with prescribed medications to achieve blood glucose in normal ranges: Fasting glucose 65-99 mg/dL   Expected Outcomes Short Term: Participant verbalizes understanding of the signs/symptoms and immediate care of hyper/hypoglycemia, proper foot care and importance of medication, aerobic/resistive exercise and nutrition plan for blood glucose control.;Long Term: Attainment of HbA1C < 7%.      Core Components/Risk Factors/Patient Goals Review:      Goals and Risk Factor Review    Row Name 05/01/17 0936             Core Components/Risk Factors/Patient Goals Review   Personal Goals Review Weight Management/Obesity       Review wt 152 today even though JSaylahreports that she is not eating well and will go to a Cardiac REhab REgisterd Dietician appt. JFloricesaid since her CABG her neck hurts when she reaching for  something in her cabinet. ANada Maclachlan EP suggested some neck stretches and reminded her when reading to keep good posture.        Expected Outcomes Heart healthy living           Core Components/Risk Factors/Patient Goals at Discharge (Final Review):      Goals and Risk Factor Review - 05/01/17 0936      Core Components/Risk Factors/Patient Goals Review  Personal Goals Review Weight Management/Obesity   Review wt 152 today even though Nesiah reports that she is not eating well and will go to a Cardiac REhab REgisterd Dietician appt. Kalkidan said since her CABG her neck hurts when she reaching for something in her cabinet. Nada Maclachlan, EP suggested some neck stretches and reminded her when reading to keep good posture.    Expected Outcomes Heart healthy living       ITP Comments:     ITP Comments    Row Name 04/16/17 1206 04/16/17 1214 04/18/17 0653 05/16/17 0618     ITP Comments Medical Review ITP created during Orientation after Cardiac REhab informed consent was signed at 10:28am today. CABG x3 diagnosis documented in [facility] 03/17/2017 [facility] Encounter where her surgery was done.  Gwynne c/o her neck hurting when she lifts her arms up over her head. Kyoko reports that she has been careful not to lift over 5lbs and her husband even weighed her purse which did not weigh more than 5 lbs. 30 day review. Continue with ITP unless directed changes per Medical Director review     New to program 30 day review. Continue with ITP unless directed changes per Medical Director review        Comments:

## 2017-05-16 NOTE — Progress Notes (Signed)
BCG Bladder Instillation  BCG # 1  Due to Bladder Cancer patient is present today for a BCG treatment. Patient was cleaned and prepped in a sterile fashion with betadine and lidocaine 2% jelly was instilled into the urethra.  A 14FR catheter was inserted, urine return was noted 181ml, urine was yellow in color.  42ml of reconstituted BCG was instilled into the bladder. The catheter was then removed. Patient tolerated well, no complications were noted  Preformed by: Elberta Leatherwood, CMA  Follow up/ Additional notes: 1 week BCG #2

## 2017-05-17 DIAGNOSIS — Z951 Presence of aortocoronary bypass graft: Secondary | ICD-10-CM

## 2017-05-17 NOTE — Progress Notes (Signed)
Daily Session Note  Patient Details  Name: Adriana Anderson MRN: [id number] Date of Birth: 04/27/1941 Referring Provider:     Cardiac Rehab from 04/16/2017 in [facility] Cardiac and Pulmonary Rehab  Referring Provider  Isaias Cowman MD      Encounter Date: 05/17/2017  Check In:     Session Check In - 05/17/17 0854      Check-In   Location [facility]-Cardiac & Pulmonary Rehab   Staff Present Alberteen Sam, MA, ACSM RCEP, Exercise Physiologist;Malone Admire Oletta Darter, BA, ACSM CEP, Exercise Physiologist;Meredith Sherryll Burger, RN BSN   Supervising physician immediately available to respond to emergencies See telemetry face sheet for immediately available ER MD   Medication changes reported     No   Fall or balance concerns reported    No   Warm-up and Cool-down Performed on first and last piece of equipment   Resistance Training Performed Yes     Pain Assessment   Currently in Pain? No/denies         History  Smoking Status  . Former Smoker  . Quit date: 04/20/1982  Smokeless Tobacco  . Never Used    Goals Met:  Independence with exercise equipment Exercise tolerated well No report of cardiac concerns or symptoms Strength training completed today  Goals Unmet:  Not Applicable  Comments: Pt able to follow exercise prescription today without complaint.  Will continue to monitor for progression.    Dr. Emily Filbert is Medical Director for [place] and [facility].

## 2017-05-22 ENCOUNTER — Encounter: Payer: Self-pay | Admitting: Dietician

## 2017-05-22 DIAGNOSIS — Z951 Presence of aortocoronary bypass graft: Secondary | ICD-10-CM | POA: Diagnosis not present

## 2017-05-22 NOTE — Progress Notes (Signed)
Daily Session Note  Patient Details  Name: Adriana Anderson MRN: [id number] Date of Birth: Apr 21, 1941 Referring Provider:     Cardiac Rehab from 04/16/2017 in [facility] Cardiac and Pulmonary Rehab  Referring Provider  Isaias Cowman MD      Encounter Date: 05/22/2017  Check In:     Session Check In - 05/22/17 0832      Check-In   Location [facility]-Cardiac & Pulmonary Rehab   Staff Present Heath Lark, RN, BSN, CCRP;Jessica Luan Pulling, MA, ACSM RCEP, Exercise Physiologist;Caetano Oberhaus Oletta Darter, [place], ACSM CEP, Exercise Physiologist   Supervising physician immediately available to respond to emergencies See telemetry face sheet for immediately available ER MD   Medication changes reported     No   Fall or balance concerns reported    No   Warm-up and Cool-down Performed on first and last piece of equipment   Resistance Training Performed Yes   VAD Patient? No     Pain Assessment   Currently in Pain? No/denies         History  Smoking Status  . Former Smoker  . Quit date: 04/20/1982  Smokeless Tobacco  . Never Used    Goals Met:  Independence with exercise equipment Exercise tolerated well No report of cardiac concerns or symptoms Strength training completed today  Goals Unmet:  Not Applicable  Comments: Pt able to follow exercise prescription today without complaint.  Will continue to monitor for progression.    Dr. Emily Filbert is Medical Director for [place] and [facility].

## 2017-05-22 NOTE — Progress Notes (Signed)
05/23/2017 10:16 AM   Adriana Anderson [birth year] [id number]  Referring provider: Madelyn Brunner, MD [place] [facility] [place], [place] [address]  Chief Complaint  Patient presents with  . Bladder Cancer    BCG 2    HPI: Patient is a 76 year old Caucasian female who presents today for 2nd of 3 maintenance BCG.    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 Jan 2018 mBCG x 04 May 2017 - cysto NED - cystology negative  Today, she states she is having frequency, urgency, nocturia and incontinence.  These are baseline.  She has recently been placed on Macrobid for an UTI by her PCP's office.  She states she was told she had bacteria in her urine.  She has been asymptomatic.  She denies any gross hematuria.  She has not had recent fevers, chills or vomiting.  She has not had a new cough.  Her UA today noted many bacteria.    PMH: Past Medical History:  Diagnosis Date  . Acid reflux   . Bladder cancer ([place])   . Bladder mass   . Diabetes mellitus, type 2 ([place])   . GERD (gastroesophageal reflux disease)   . History of biopsy of bladder   . History of cystoscopy   . Liver disease   . PONV (postoperative nausea and vomiting)     Surgical History: Past Surgical History:  Procedure Laterality Date  . APPENDECTOMY    . CYSTOSCOPY WITH BIOPSY N/A 11/15/2015   Procedure: CYSTOSCOPY WITH BIOPSY;  Surgeon: [name], MD;  Location: [facility] ORS;  Service: Urology;  Laterality: N/A;  . LEFT HEART CATH AND CORONARY ANGIOGRAPHY Left 02/23/2017   Procedure: Left Heart Cath  and Coronary Angiography;  Surgeon: Isaias Cowman, MD;  Location: [facility];  Service: Cardiovascular;  Laterality: Left;  . TOE SURGERY      Home Medications:  Allergies as of 05/23/2017      Reactions   Tetanus-diphth-acell Pertussis Swelling   Sulfa Antibiotics Rash      Medication List       Accurate as of 05/23/17 10:16 AM. Always use your most recent med list.          amiodarone 200 MG tablet Commonly known as:  PACERONE Take 400mg  (2 tablets) twice a day for 6 days. Then reduced to 200mg  (1 tablet) a day starting 6/24.   apixaban 5 MG Tabs tablet Commonly known as:  ELIQUIS Take by mouth.   aspirin EC 81 MG tablet Take 81 mg by mouth at bedtime.   atorvastatin 40 MG tablet Commonly known as:  LIPITOR Take by mouth.   CALTRATE 600+D PO Take 1 tablet by mouth 2 (two) times daily.   estradiol 0.1 MG/GM vaginal cream Commonly known as:  ESTRACE VAGINAL USE 1 APPLICATION VAGINALLY ONCE A WEEK   loratadine 10 MG tablet Commonly known as:  CLARITIN Take 10 mg by mouth daily as needed for allergies.   metoprolol tartrate 25 MG tablet Commonly known as:  LOPRESSOR Take by mouth.  omeprazole-sodium bicarbonate 40-1100 MG capsule Commonly known as:  ZEGERID Take 1 capsule by mouth daily. 20 instesd of 40   ondansetron 4 MG disintegrating tablet Commonly known as:  ZOFRAN ODT Take 1 tablet (4 mg total) by mouth every 8 (eight) hours as needed for nausea or vomiting.   ondansetron 4 MG tablet Commonly known as:  ZOFRAN Take 1 tablet (4 mg total) by mouth every 8 (eight) hours as needed for nausea or vomiting.   polyethylene glycol powder powder Commonly known as:  GLYCOLAX/MIRALAX Take 17 g by mouth every other day.   PROBIOTIC DAILY PO Take 1 capsule by mouth daily.   ranitidine 150 MG tablet Commonly known as:  ZANTAC Take 150 mg by mouth at bedtime as needed for heartburn.   solifenacin 10 MG tablet Commonly known as:   VESICARE Take 1 tablet (10 mg total) by mouth daily.   TUMS EXTRA STRENGTH 750 750 MG chewable tablet Generic drug:  calcium carbonate Chew 2,250-3,750 tablets by mouth at bedtime as needed for heartburn.   URIBEL 118 MG Caps Take 1 capsule (118 mg total) by mouth QID.            Discharge Care Instructions        Start     Ordered   05/23/17 0000  Urinalysis, Complete     05/23/17 0918      Allergies:  Allergies  Allergen Reactions  . Tetanus-Diphth-Acell Pertussis Swelling  . Sulfa Antibiotics Rash    Family History: Family History  Problem Relation Age of Onset  . Bladder Cancer Brother        x 3   . Brain cancer Father   . Kidney disease Father   . Diabetes Unknown   . Prostate cancer Neg Hx   . Kidney cancer Neg Hx     Social History:  reports that she quit smoking about 35 years ago. She has never used smokeless tobacco. She reports that she drinks alcohol. She reports that she does not use drugs.  ROS: UROLOGY Frequent Urination?: Yes Hard to postpone urination?: Yes Burning/pain with urination?: No Get up at night to urinate?: Yes Leakage of urine?: Yes Urine stream starts and stops?: No Trouble starting stream?: No Do you have to strain to urinate?: No Blood in urine?: No Urinary tract infection?: Yes Sexually transmitted disease?: No Injury to kidneys or bladder?: No Painful intercourse?: No Weak stream?: No Currently pregnant?: No Vaginal bleeding?: No Last menstrual period?: n  Gastrointestinal Nausea?: Yes Vomiting?: No Indigestion/heartburn?: No Diarrhea?: No Constipation?: Yes  Constitutional Fever: No Night sweats?: No Weight loss?: No Fatigue?: No  Skin Skin rash/lesions?: No Itching?: No  Eyes Blurred vision?: No Double vision?: No  Ears/Nose/Throat Sore throat?: No Sinus problems?: No  Hematologic/Lymphatic Swollen glands?: No Easy bruising?: Yes  Cardiovascular Leg swelling?: No Chest pain?:  No  Respiratory Cough?: Yes Shortness of breath?: No  Endocrine Excessive thirst?: No  Musculoskeletal Back pain?: Yes Joint pain?: No  Neurological Headaches?: No Dizziness?: No  Psychologic Depression?: No Anxiety?: No  Physical Exam: BP 130/74   Pulse (!) 59   Ht 5\' 5"  (1.651 m)   Wt 143 lb 8 oz (65.1 kg)   BMI 23.88 kg/m   Constitutional: Well nourished. Alert and oriented, No acute distress. HEENT:  AT, moist mucus membranes. Trachea midline, no masses. Cardiovascular: No clubbing, cyanosis, or edema. Respiratory: Normal respiratory effort, no increased work of breathing. GI: Abdomen is soft, non tender, non distended, no  abdominal masses. Liver and spleen not palpable.  No hernias appreciated.  Stool sample for occult testing is not indicated.   GU: No CVA tenderness.  No bladder fullness or masses.   Skin: No rashes, bruises or suspicious lesions. Lymph: No cervical or inguinal adenopathy. Neurologic: Grossly intact, no focal deficits, moving all 4 extremities. Psychiatric: Normal mood and affect.  Laboratory Data: Urinalysis Many bactreriaSee EPIC.  I have reviewed the labs   Assessment & Plan:    1. Malignant neoplasm of urinary bladder, unspecified site ([place])  - Reviewed BCG treatment course, possible side effects including BCG sepsis, bladder irritation, worsening of her urinary symptoms  - # 2 of 3 maintenance BCG NOT installed today  - Patient was instructed to pour bleach down her toilet for the next 6 hours  -  Instructed to call the office if she should experience fevers greater than 102, chills/rigors, onset of a new cough, night sweats or further bladder spasms or inability to urinate   - RTC in one week for # 2 maintenance BCG  - Urinalysis, Complete  - bcg vaccine injection 81 mg; Instill 3.24 mLs (81 mg total) into the bladder once.  - lidocaine (XYLOCAINE) 2 % jelly 1 application; Place 1 application into the urethra once.   2.  CIS  - see above  - if patient has reoccurrence, cystectomy is recommended  3. UTI  - Explained to the patient that she should not be treated for asymptomatic UTI's - especially the light of undergoing BCG treatments as it will delay therapy - this may result in the recurrence of her bladder cancer which may require cystectomy for treatment  - Explained to the patient that she is likely colonized at this point  - Asked the patient to contact us prior to obtaining any UAs, extracting urinary tract symptoms or starting antibiotic for a UTI  Return in about 1 week (around 05/30/2017) for # 2 BCG.  These notes generated with voice recognition software. I apologize for typographical errors.  Zara Council, [facility] [address] [place], [place] [address] [phone]

## 2017-05-23 ENCOUNTER — Ambulatory Visit (INDEPENDENT_AMBULATORY_CARE_PROVIDER_SITE_OTHER): Payer: Medicare Other | Admitting: Urology

## 2017-05-23 ENCOUNTER — Encounter: Payer: Self-pay | Admitting: Urology

## 2017-05-23 VITALS — BP 130/74 | HR 59 | Ht 65.0 in | Wt 143.5 lb

## 2017-05-23 DIAGNOSIS — N3000 Acute cystitis without hematuria: Secondary | ICD-10-CM | POA: Diagnosis not present

## 2017-05-23 DIAGNOSIS — C679 Malignant neoplasm of bladder, unspecified: Secondary | ICD-10-CM

## 2017-05-23 DIAGNOSIS — D090 Carcinoma in situ of bladder: Secondary | ICD-10-CM

## 2017-05-23 LAB — URINALYSIS, COMPLETE
BILIRUBIN UA: NEGATIVE
GLUCOSE, UA: NEGATIVE
Ketones, UA: NEGATIVE
LEUKOCYTES UA: NEGATIVE
Nitrite, UA: NEGATIVE
PH UA: 6 (ref 5.0–7.5)
PROTEIN UA: NEGATIVE
RBC UA: NEGATIVE
SPEC GRAV UA: 1.01 (ref 1.005–1.030)
UUROB: 0.2 mg/dL (ref 0.2–1.0)

## 2017-05-23 LAB — MICROSCOPIC EXAMINATION
RBC, UA: NONE SEEN /hpf (ref 0–?)
WBC UA: NONE SEEN /HPF (ref 0–?)

## 2017-05-23 MED ORDER — BCG LIVE 50 MG IS SUSR
3.2400 mL | Freq: Once | INTRAVESICAL | Status: DC
Start: 2017-05-23 — End: 2017-05-23

## 2017-05-23 MED ORDER — LIDOCAINE HCL 2 % EX GEL: 1.0000 "application " | Freq: Once | CUTANEOUS | Status: DC

## 2017-05-23 NOTE — Progress Notes (Signed)
NO BCG today patient on macrobid for uti Dr. Gilford Rile

## 2017-05-24 ENCOUNTER — Encounter: Payer: Medicare Other | Admitting: *Deleted

## 2017-05-24 DIAGNOSIS — Z951 Presence of aortocoronary bypass graft: Secondary | ICD-10-CM

## 2017-05-24 NOTE — Progress Notes (Signed)
Daily Session Note  Patient Details  Name: Adriana Anderson MRN: [id number] Date of Birth: [birth year] Referring Provider:     Cardiac Rehab from 04/16/2017 in [facility] Cardiac and Pulmonary Rehab  Referring Provider  Isaias Cowman MD      Encounter Date: 05/24/2017  Check In:     Session Check In - 05/24/17 0926      Check-In   Location [facility]-Cardiac & Pulmonary Rehab   Staff Present Alberteen Sam, MA, ACSM RCEP, Exercise Physiologist;Amanda Oletta Darter, BA, ACSM CEP, Exercise Physiologist;Meredith Sherryll Burger, RN BSN   Supervising physician immediately available to respond to emergencies See telemetry face sheet for immediately available ER MD   Medication changes reported     No   Fall or balance concerns reported    No   Warm-up and Cool-down Performed on first and last piece of equipment   Resistance Training Performed Yes   VAD Patient? No     Pain Assessment   Currently in Pain? No/denies   Multiple Pain Sites No           Exercise Prescription Changes - 05/23/17 1500      Response to Exercise   Blood Pressure (Admit) 128/68   Blood Pressure (Exercise) 134/76   Blood Pressure (Exit) 126/72   Heart Rate (Admit) 66 bpm   Heart Rate (Exercise) 80 bpm   Heart Rate (Exit) 63 bpm   Rating of Perceived Exertion (Exercise) 11   Symptoms none   Duration Continue with 45 min of aerobic exercise without signs/symptoms of physical distress.   Intensity THRR unchanged     Progression   Progression Continue to progress workloads to maintain intensity without signs/symptoms of physical distress.   Average METs 2.25     Resistance Training   Training Prescription Yes   Weight 3 lbs   Reps 10-15     Interval Training   Interval Training No     Treadmill   MPH 2   Grade 0.5   Minutes 15   METs 2.67     NuStep   Level 3   Minutes 15   METs 2.1     Biostep-RELP   Level 1   Minutes 15   METs 2     Home Exercise Plan   Plans to continue exercise at Home (comment)   walking   Frequency Add 3 additional days to program exercise sessions.   Initial Home Exercises Provided 05/15/17      History  Smoking Status  . Former Smoker  . Quit date: 04/20/1982  Smokeless Tobacco  . Never Used    Goals Met:  Independence with exercise equipment Exercise tolerated well No report of cardiac concerns or symptoms Strength training completed today  Goals Unmet:  Not Applicable  Comments: Pt able to follow exercise prescription today without complaint.  Will continue to monitor for progression.    Dr. Emily Filbert is Medical Director for [place] and [facility].

## 2017-05-29 ENCOUNTER — Encounter: Payer: Medicare Other | Admitting: *Deleted

## 2017-05-29 DIAGNOSIS — Z951 Presence of aortocoronary bypass graft: Secondary | ICD-10-CM | POA: Diagnosis not present

## 2017-05-29 NOTE — Progress Notes (Addendum)
05/30/2017 10:44 AM   Adriana Anderson 1941-07-11 [id number]  Referring provider: Madelyn Brunner, MD [facility] [place], [place] [address]  Chief Complaint  Patient presents with  . Bladder Cancer    BCG 2    HPI: Patient is a 76 year old Caucasian female who presents today for 2nd of 3 maintenance BCG.    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 Jan 2018 mBCG x 04 May 2017 - cysto NED - cytology negative  Today, she states she is having frequency, dysuria, urgency, nocturia and incontinence. She is also having suprapubic pain.  She has recently finished her Macrobid for an UTI by her PCP's office.  She states she was told she had bacteria in her urine.  She has been asymptomatic.  She denies any gross hematuria.  She has not had recent fevers, chills or vomiting.  She has not had a new cough.  Her UA today noted many bacteria, > 30 WBC's and it was nitrite positive.  She was CATHED for urine to send for culture.    PMH: Past Medical History:  Diagnosis Date  . Acid reflux   . Bladder cancer ([place])   . Bladder mass   . Diabetes mellitus, type 2 ([place])   . GERD (gastroesophageal reflux disease)   . History of biopsy of bladder   . History of cystoscopy   . Liver disease   . PONV (postoperative nausea and vomiting)     Surgical History: Past Surgical History:  Procedure Laterality Date  . APPENDECTOMY    . CYSTOSCOPY WITH BIOPSY N/A 11/15/2015   Procedure: CYSTOSCOPY WITH BIOPSY;  Surgeon: [name], MD;  Location: [facility] ORS;  Service: Urology;   Laterality: N/A;  . LEFT HEART CATH AND CORONARY ANGIOGRAPHY Left 02/23/2017   Procedure: Left Heart Cath and Coronary Angiography;  Surgeon: Isaias Cowman, MD;  Location: [place] CV LAB;  Service: Cardiovascular;  Laterality: Left;  . TOE SURGERY      Home Medications:  Allergies as of 05/30/2017      Reactions   Tetanus-diphth-acell Pertussis Swelling   Sulfa Antibiotics Rash      Medication List       Accurate as of 05/30/17 10:44 AM. Always use your most recent med list.          amoxicillin-clavulanate 875-125 MG tablet Commonly known as:  AUGMENTIN Take 1 tablet by mouth every 12 (twelve) hours.   apixaban 5 MG Tabs tablet Commonly known as:  ELIQUIS Take by mouth.   aspirin EC 81 MG tablet Take 81 mg by mouth at bedtime.   atorvastatin 40 MG tablet Commonly known as:  LIPITOR Take by mouth.   CALTRATE 600+D PO Take 1 tablet by mouth 2 (two) times daily.   estradiol 0.1 MG/GM vaginal cream Commonly known as:  ESTRACE VAGINAL USE 1 APPLICATION VAGINALLY ONCE A WEEK   loratadine 10 MG tablet Commonly known as:  CLARITIN Take 10 mg by mouth daily as needed for allergies.   metoprolol tartrate 25 MG tablet  Commonly known as:  LOPRESSOR Take by mouth.   omeprazole-sodium bicarbonate 40-1100 MG capsule Commonly known as:  ZEGERID Take 1 capsule by mouth daily. 20 instesd of 40   ondansetron 4 MG disintegrating tablet Commonly known as:  ZOFRAN ODT Take 1 tablet (4 mg total) by mouth every 8 (eight) hours as needed for nausea or vomiting.   ondansetron 4 MG tablet Commonly known as:  ZOFRAN Take 1 tablet (4 mg total) by mouth every 8 (eight) hours as needed for nausea or vomiting.   polyethylene glycol powder powder Commonly known as:  GLYCOLAX/MIRALAX Take 17 g by mouth every other day.   PROBIOTIC DAILY PO Take 1 capsule by mouth daily.   ranitidine 150 MG tablet Commonly known as:  ZANTAC Take 150 mg by mouth at bedtime as needed for  heartburn.   solifenacin 10 MG tablet Commonly known as:  VESICARE Take 1 tablet (10 mg total) by mouth daily.   TUMS EXTRA STRENGTH 750 750 MG chewable tablet Generic drug:  calcium carbonate Chew 2,250-3,750 tablets by mouth at bedtime as needed for heartburn.   URIBEL 118 MG Caps Take 1 capsule (118 mg total) by mouth QID.            Discharge Care Instructions        Start     Ordered   05/30/17 0000  Urinalysis, Complete     05/30/17 1006   05/30/17 0000  CULTURE, URINE COMPREHENSIVE     05/30/17 1038   05/30/17 0000  amoxicillin-clavulanate (AUGMENTIN) 875-125 MG tablet  Every 12 hours    Question:  Supervising Provider  Answer:  [name]   05/30/17 1043      Allergies:  Allergies  Allergen Reactions  . Tetanus-Diphth-Acell Pertussis Swelling  . Sulfa Antibiotics Rash    Family History: Family History  Problem Relation Age of Onset  . Bladder Cancer Brother        x 3   . Brain cancer Father   . Kidney disease Father   . Diabetes Unknown   . Prostate cancer Neg Hx   . Kidney cancer Neg Hx     Social History:  reports that she quit smoking about 35 years ago. She has never used smokeless tobacco. She reports that she drinks alcohol. She reports that she does not use drugs.  ROS: UROLOGY Frequent Urination?: Yes Hard to postpone urination?: Yes Burning/pain with urination?: No Get up at night to urinate?: Yes Leakage of urine?: Yes Urine stream starts and stops?: No Trouble starting stream?: No Do you have to strain to urinate?: No Blood in urine?: No Urinary tract infection?: Yes Sexually transmitted disease?: No Injury to kidneys or bladder?: No Painful intercourse?: No Weak stream?: No Currently pregnant?: No Vaginal bleeding?: No Last menstrual period?: n  Gastrointestinal Nausea?: Yes Vomiting?: No Indigestion/heartburn?: Yes Diarrhea?: No Constipation?: Yes  Constitutional Fever: No Night sweats?: No Weight loss?:  No Fatigue?: No  Skin Skin rash/lesions?: No Itching?: No  Eyes Blurred vision?: No Double vision?: No  Ears/Nose/Throat Sore throat?: No Sinus problems?: No  Hematologic/Lymphatic Swollen glands?: No Easy bruising?: Yes  Cardiovascular Leg swelling?: Yes Chest pain?: No  Respiratory Cough?: Yes Shortness of breath?: No  Endocrine Excessive thirst?: No  Musculoskeletal Back pain?: Yes Joint pain?: No  Neurological Headaches?: No Dizziness?: No  Psychologic Depression?: No Anxiety?: No  Physical Exam: BP (!) 151/83   Pulse 66   Ht 5\' 5"  (1.651 m)   Wt 142 lb  8 oz (64.6 kg)   BMI 23.71 kg/m   Constitutional: Well nourished. Alert and oriented, No acute distress. HEENT: [place] AT, moist mucus membranes. Trachea midline, no masses. Cardiovascular: No clubbing, cyanosis, or edema. Respiratory: Normal respiratory effort, no increased work of breathing. GI: Abdomen is soft, non tender, non distended, no abdominal masses. Liver and spleen not palpable.  No hernias appreciated.  Stool sample for occult testing is not indicated.   GU: No CVA tenderness.  No bladder fullness or masses.   Skin: No rashes, bruises or suspicious lesions. Lymph: No cervical or inguinal adenopathy. Neurologic: Grossly intact, no focal deficits, moving all 4 extremities. Psychiatric: Normal mood and affect.  Laboratory Data: Urinalysis Many bactreria, nitrite and > 30 WBC's.  See EPIC.  I have reviewed the labs   Assessment & Plan:    1. Malignant neoplasm of urinary bladder, unspecified site ([place])  - Reviewed BCG treatment course, possible side effects including BCG sepsis, bladder irritation, worsening of her urinary symptoms  - # 2 of 3 maintenance BCG NOT installed today  - Patient was instructed to pour bleach down her toilet for the next 6 hours  -  Instructed to call the office if she should experience fevers greater than 102, chills/rigors, onset of a new cough, night  sweats or further bladder spasms or inability to urinate   - RTC in one week for # 2 maintenance BCG  - Urinalysis, Complete  - bcg vaccine injection 81 mg; Instill 3.24 mLs (81 mg total) into the bladder once.  - lidocaine (XYLOCAINE) 2 % jelly 1 application; Place 1 application into the urethra once.   2. CIS  - see above  - if patient has reoccurrence, cystectomy is recommended  3. UTI  - UA is suspicious for infection  - Will empirically start Augmentin 875/125 while awaiting culture results    Return in about 1 week (around 06/06/2017) for # 2 BCG.  These notes generated with voice recognition software. I apologize for typographical errors.  Zara Council, [facility] [address] [place], [place] [address] [phone]

## 2017-05-29 NOTE — Progress Notes (Signed)
Daily Session Note  Patient Details  Name: Adriana Anderson MRN: [id number] Date of Birth: 1940-12-01 Referring Provider:     Cardiac Rehab from 04/16/2017 in [facility] Cardiac and Pulmonary Rehab  Referring Provider  Isaias Cowman MD      Encounter Date: 05/29/2017  Check In:     Session Check In - 05/29/17 0843      Check-In   Location [facility]-Cardiac & Pulmonary Rehab   Staff Present Heath Lark, RN, BSN, CCRP;Jessica Luan Pulling, MA, ACSM RCEP, Exercise Physiologist;Amanda Oletta Darter, BA, ACSM CEP, Exercise Physiologist;Shiara Mcgough Flavia Shipper   Supervising physician immediately available to respond to emergencies See telemetry face sheet for immediately available ER MD   Medication changes reported     No   Fall or balance concerns reported    No   Warm-up and Cool-down Performed on first and last piece of equipment   Resistance Training Performed Yes   VAD Patient? No     Pain Assessment   Currently in Pain? No/denies   Multiple Pain Sites No         History  Smoking Status  . Former Smoker  . Quit date: 04/20/1982  Smokeless Tobacco  . Never Used    Goals Met:  Independence with exercise equipment Exercise tolerated well Personal goals reviewed No report of cardiac concerns or symptoms Strength training completed today  Goals Unmet:  Not Applicable  Comments: Pt able to follow exercise prescription today without complaint.  Will continue to monitor for progression.   Dr. Emily Filbert is Medical Director for [place] and [facility].

## 2017-05-30 ENCOUNTER — Ambulatory Visit: Payer: Medicare Other | Admitting: Urology

## 2017-05-30 ENCOUNTER — Encounter: Payer: Self-pay | Admitting: Urology

## 2017-05-30 VITALS — BP 151/83 | HR 66 | Ht 65.0 in | Wt 142.5 lb

## 2017-05-30 DIAGNOSIS — C679 Malignant neoplasm of bladder, unspecified: Secondary | ICD-10-CM | POA: Diagnosis not present

## 2017-05-30 DIAGNOSIS — N3000 Acute cystitis without hematuria: Secondary | ICD-10-CM | POA: Diagnosis not present

## 2017-05-30 DIAGNOSIS — D090 Carcinoma in situ of bladder: Secondary | ICD-10-CM | POA: Diagnosis not present

## 2017-05-30 LAB — URINALYSIS, COMPLETE
Bilirubin, UA: NEGATIVE
Glucose, UA: NEGATIVE
Ketones, UA: NEGATIVE
NITRITE UA: POSITIVE — AB
PH UA: 6 (ref 5.0–7.5)
UUROB: 0.2 mg/dL (ref 0.2–1.0)

## 2017-05-30 LAB — MICROSCOPIC EXAMINATION
RBC MICROSCOPIC, UA: NONE SEEN /HPF (ref 0–?)
WBC, UA: >30 /hpf — ABNORMAL HIGH (ref 0–?)

## 2017-05-30 MED ORDER — LIDOCAINE HCL 2 % EX GEL: 1.0000 "application " | Freq: Once | CUTANEOUS | Status: DC

## 2017-05-30 MED ORDER — AMOXICILLIN-POT CLAVULANATE 875-125 MG PO TABS: 1.0000 | ORAL_TABLET | Freq: Two times a day (BID) | ORAL | 0 refills | Status: DC

## 2017-05-30 MED ORDER — BCG LIVE 50 MG IS SUSR: 3.2400 mL | Freq: Once | INTRAVESICAL | Status: DC

## 2017-05-30 NOTE — Progress Notes (Signed)
Patient did not receive BCG still has a uti.

## 2017-05-31 ENCOUNTER — Other Ambulatory Visit: Payer: Self-pay | Admitting: Urology

## 2017-05-31 DIAGNOSIS — Z951 Presence of aortocoronary bypass graft: Secondary | ICD-10-CM

## 2017-05-31 DIAGNOSIS — N3281 Overactive bladder: Secondary | ICD-10-CM

## 2017-05-31 NOTE — Progress Notes (Signed)
Daily Session Note  Patient Details  Name: Adriana Anderson MRN: [id number] Date of Birth: Feb 22, 1941 Referring Provider:     Cardiac Rehab from 04/16/2017 in [facility] Cardiac and Pulmonary Rehab  Referring Provider  Isaias Cowman MD      Encounter Date: 05/31/2017  Check In:     Session Check In - 05/31/17 0924      Check-In   Location [facility]-Cardiac & Pulmonary Rehab   Staff Present Alberteen Sam, MA, ACSM RCEP, Exercise Physiologist;Katessa [place]tridge Oletta Darter, BA, ACSM CEP, Exercise Physiologist;Krista Frederico Hamman, RN BSN   Supervising physician immediately available to respond to emergencies See telemetry face sheet for immediately available ER MD   Medication changes reported     No   Fall or balance concerns reported    No   Warm-up and Cool-down Performed on first and last piece of equipment   Resistance Training Performed Yes   VAD Patient? No     Pain Assessment   Currently in Pain? No/denies         History  Smoking Status  . Former Smoker  . Quit date: 04/20/1982  Smokeless Tobacco  . Never Used    Goals Met:  Independence with exercise equipment Exercise tolerated well No report of cardiac concerns or symptoms Strength training completed today  Goals Unmet:  Not Applicable  Comments: Pt able to follow exercise prescription today without complaint.  Will continue to monitor for progression.    Dr. Emily Filbert is Medical Director for [place] [facility].

## 2017-06-05 ENCOUNTER — Encounter: Payer: Medicare Other | Attending: Cardiology

## 2017-06-05 ENCOUNTER — Telehealth: Payer: Self-pay

## 2017-06-05 DIAGNOSIS — Z7901 Long term (current) use of anticoagulants: Secondary | ICD-10-CM | POA: Diagnosis not present

## 2017-06-05 DIAGNOSIS — Z79899 Other long term (current) drug therapy: Secondary | ICD-10-CM | POA: Diagnosis not present

## 2017-06-05 DIAGNOSIS — Z87891 Personal history of nicotine dependence: Secondary | ICD-10-CM | POA: Insufficient documentation

## 2017-06-05 DIAGNOSIS — Z951 Presence of aortocoronary bypass graft: Secondary | ICD-10-CM | POA: Diagnosis not present

## 2017-06-05 DIAGNOSIS — K219 Gastro-esophageal reflux disease without esophagitis: Secondary | ICD-10-CM | POA: Diagnosis not present

## 2017-06-05 DIAGNOSIS — E119 Type 2 diabetes mellitus without complications: Secondary | ICD-10-CM | POA: Diagnosis not present

## 2017-06-05 DIAGNOSIS — Z7982 Long term (current) use of aspirin: Secondary | ICD-10-CM | POA: Insufficient documentation

## 2017-06-05 DIAGNOSIS — K769 Liver disease, unspecified: Secondary | ICD-10-CM | POA: Diagnosis not present

## 2017-06-05 LAB — CULTURE, URINE COMPREHENSIVE

## 2017-06-05 NOTE — Progress Notes (Signed)
Daily Session Note  Patient Details  Name: Adriana Anderson MRN: [id number] Date of Birth: 26-Jun-1941 Referring Provider:     Cardiac Rehab from 04/16/2017 in [facility] Cardiac and Pulmonary Rehab  Referring Provider  Isaias Cowman MD      Encounter Date: 06/05/2017  Check In:     Session Check In - 06/05/17 0955      Check-In   Location [facility]-Cardiac & Pulmonary Rehab   Staff Present Nada Maclachlan, BA, ACSM CEP, Exercise Physiologist;Susanne Bice, RN, BSN, CCRP;Jessica Luan Pulling, MA, ACSM RCEP, Exercise Physiologist   Supervising physician immediately available to respond to emergencies See telemetry face sheet for immediately available ER MD   Medication changes reported     No   Fall or balance concerns reported    No   Warm-up and Cool-down Performed on first and last piece of equipment   Resistance Training Performed No   VAD Patient? No         History  Smoking Status  . Former Smoker  . Quit date: 04/20/1982  Smokeless Tobacco  . Never Used    Goals Met:  Independence with exercise equipment Exercise tolerated well No report of cardiac concerns or symptoms Strength training completed today  Goals Unmet:  Not Applicable  Comments: Reviewed RPE scale, THR and program prescription with pt today.  Pt voiced understanding and was given a copy of goals to take home.   Short: Use RPE daily to regulate intensity.  Long: Follow program prescription in THR.    Dr. Emily Filbert is Medical Director for [place] [facility].

## 2017-06-05 NOTE — Telephone Encounter (Signed)
-----   Message from Nori Riis, PA-C sent at 06/05/2017 11:56 AM EDT ----- Please let Mrs. Tout know that her urine culture did return positive.  The Augmentin that was prescribed is the appropriate antibiotic.

## 2017-06-05 NOTE — Telephone Encounter (Signed)
Spoke with pt in reference to ucx results and abx. Pt voiced understanding. 

## 2017-06-06 ENCOUNTER — Ambulatory Visit: Payer: Medicare Other | Admitting: Urology

## 2017-06-07 DIAGNOSIS — Z951 Presence of aortocoronary bypass graft: Secondary | ICD-10-CM | POA: Diagnosis not present

## 2017-06-07 NOTE — Progress Notes (Signed)
Daily Session Note  Patient Details  Name: RANI IDLER MRN: [id number] Date of Birth: 1941-01-25 Referring Provider:     Cardiac Rehab from 04/16/2017 in [facility] Cardiac and Pulmonary Rehab  Referring Provider  Isaias Cowman MD      Encounter Date: 06/07/2017  Check In:     Session Check In - 06/07/17 0840      Check-In   Location [facility]-Cardiac & Pulmonary Rehab   Staff Present Alberteen Sam, MA, ACSM RCEP, Exercise Physiologist;Froylan Hobby Oletta Darter, BA, ACSM CEP, Exercise Physiologist;Meredith Sherryll Burger, RN BSN   Supervising physician immediately available to respond to emergencies See telemetry face sheet for immediately available ER MD   Medication changes reported     No   Fall or balance concerns reported    No   Warm-up and Cool-down Performed on first and last piece of equipment   Resistance Training Performed Yes   VAD Patient? No     Pain Assessment   Currently in Pain? No/denies         History  Smoking Status  . Former Smoker  . Quit date: 04/20/1982  Smokeless Tobacco  . Never Used    Goals Met:  Independence with exercise equipment Exercise tolerated well No report of cardiac concerns or symptoms Strength training completed today  Goals Unmet:  Not Applicable  Comments: Pt able to follow exercise prescription today without complaint.  Will continue to monitor for progression.    Dr. Emily Filbert is Medical Director for [place] [facility].

## 2017-06-08 ENCOUNTER — Other Ambulatory Visit: Payer: Self-pay

## 2017-06-08 DIAGNOSIS — N3281 Overactive bladder: Secondary | ICD-10-CM

## 2017-06-08 MED ORDER — SOLIFENACIN SUCCINATE 10 MG PO TABS: 10.0000 mg | ORAL_TABLET | Freq: Every day | ORAL | 0 refills | Status: DC

## 2017-06-12 DIAGNOSIS — Z951 Presence of aortocoronary bypass graft: Secondary | ICD-10-CM | POA: Diagnosis not present

## 2017-06-12 NOTE — Progress Notes (Addendum)
06/13/2017 10:59 AM   Linus Salmons [birth year] [id number]  Referring provider: Madelyn Brunner, MD [place] [place], [place] [address]  Chief Complaint  Patient presents with  . Bladder Cancer    BCG 2 of 3    HPI: Patient is a 76 year old Caucasian female who presents today for 2nd of 3 maintenance BCG.    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 Jan 2018 mBCG x 04 May 2017 - cysto NED - cytology negative  Today, she states she is having frequency, urgency, nocturia and incontinence. She is also having suprapubic pain.  She denies any gross hematuria, dysuria and suprapubic pain.  She has not had recent fevers, chills or vomiting.  She has not had a new cough.  Her UA today noted many bacteria, 11-30 WBC's and yeast.    PMH: Past Medical History:  Diagnosis Date  . Acid reflux   . Bladder cancer ([place])   . Bladder mass   . Diabetes mellitus, type 2 ([place])   . GERD (gastroesophageal reflux disease)   . History of biopsy of bladder   . History of cystoscopy   . Liver disease   . PONV (postoperative nausea and vomiting)     Surgical History: Past Surgical History:  Procedure Laterality Date  . APPENDECTOMY    . CYSTOSCOPY WITH BIOPSY N/A 11/15/2015   Procedure: CYSTOSCOPY WITH BIOPSY;  Surgeon: [name], MD;  Location: [facility] ORS;  Service: Urology;  Laterality: N/A;  . LEFT HEART CATH AND CORONARY ANGIOGRAPHY Left 02/23/2017   Procedure: Left Heart Cath and Coronary Angiography;  Surgeon: Isaias Cowman, MD;  Location: [place]  CV LAB;  Service: Cardiovascular;  Laterality: Left;  . TOE SURGERY      Home Medications:  Allergies as of 06/13/2017      Reactions   Tetanus-diphth-acell Pertussis Swelling   Sulfa Antibiotics Rash      Medication List       Accurate as of 06/13/17 10:59 AM. Always use your most recent med list.          amoxicillin-clavulanate 875-125 MG tablet Commonly known as:  AUGMENTIN Take 1 tablet by mouth every 12 (twelve) hours.   apixaban 5 MG Tabs tablet Commonly known as:  ELIQUIS Take by mouth.   aspirin EC 81 MG tablet Take 81 mg by mouth at bedtime.   atorvastatin 40 MG tablet Commonly known as:  LIPITOR Take by mouth.   CALTRATE 600+D PO Take 1 tablet by mouth 2 (two) times daily.   estradiol 0.1 MG/GM vaginal cream Commonly known as:  ESTRACE VAGINAL USE 1 APPLICATION VAGINALLY ONCE A WEEK   loratadine 10 MG tablet Commonly known as:  CLARITIN Take 10 mg by mouth daily as needed for allergies.   metoprolol tartrate 25 MG tablet Commonly known as:  LOPRESSOR Take by mouth.   omeprazole-sodium bicarbonate 40-1100 MG capsule Commonly known as:  ZEGERID Take 1 capsule by mouth 2 (two) times daily. 20 instesd of 40   ondansetron 4 MG disintegrating tablet  Commonly known as:  ZOFRAN ODT Take 1 tablet (4 mg total) by mouth every 8 (eight) hours as needed for nausea or vomiting.   ondansetron 4 MG tablet Commonly known as:  ZOFRAN Take 1 tablet (4 mg total) by mouth every 8 (eight) hours as needed for nausea or vomiting.   polyethylene glycol powder powder Commonly known as:  GLYCOLAX/MIRALAX Take 17 g by mouth every other day.   PROBIOTIC DAILY PO Take 1 capsule by mouth daily.   ranitidine 150 MG tablet Commonly known as:  ZANTAC Take 150 mg by mouth at bedtime as needed for heartburn.   solifenacin 10 MG tablet Commonly known as:  VESICARE Take 1 tablet (10 mg total) by mouth daily.   TUMS EXTRA STRENGTH 750 750 MG chewable tablet Generic  drug:  calcium carbonate Chew 2,250-3,750 tablets by mouth at bedtime as needed for heartburn.   URIBEL 118 MG Caps Take 1 capsule (118 mg total) by mouth QID.            Discharge Care Instructions        Start     Ordered   06/13/17 1030  BCG LIVE 50 MG IS SUSR   Once     06/13/17 1018   06/13/17 1030  LIDOCAINE HCL 2 % EX GEL   Once     06/13/17 1018   06/13/17 0000  Urinalysis, Complete     06/13/17 1018      Allergies:  Allergies  Allergen Reactions  . Tetanus-Diphth-Acell Pertussis Swelling  . Sulfa Antibiotics Rash    Family History: Family History  Problem Relation Age of Onset  . Bladder Cancer Brother        x 3   . Brain cancer Father   . Kidney disease Father   . Diabetes Unknown   . Heart attack Mother   . Heart failure Sister   . Bladder Cancer Brother   . Prostate cancer Neg Hx   . Kidney cancer Neg Hx     Social History:  reports that she quit smoking about 35 years ago. She has never used smokeless tobacco. She reports that she drinks alcohol. She reports that she does not use drugs.  ROS: UROLOGY Frequent Urination?: Yes Hard to postpone urination?: Yes Burning/pain with urination?: No Get up at night to urinate?: Yes Leakage of urine?: Yes Urine stream starts and stops?: No Trouble starting stream?: No Do you have to strain to urinate?: No Blood in urine?: No Urinary tract infection?: No Sexually transmitted disease?: No Injury to kidneys or bladder?: No Painful intercourse?: No Weak stream?: No Currently pregnant?: No Vaginal bleeding?: No Last menstrual period?: n  Gastrointestinal Nausea?: Yes Vomiting?: No Indigestion/heartburn?: Yes Diarrhea?: No Constipation?: Yes  Constitutional Fever: No Night sweats?: No Weight loss?: No Fatigue?: No  Skin Skin rash/lesions?: No Itching?: No  Eyes Blurred vision?: No Double vision?: No  Ears/Nose/Throat Sore throat?: No Sinus problems?:  No  Hematologic/Lymphatic Swollen glands?: No Easy bruising?: Yes  Cardiovascular Leg swelling?: No Chest pain?: No  Respiratory Cough?: Yes Shortness of breath?: No  Endocrine Excessive thirst?: No  Musculoskeletal Back pain?: Yes Joint pain?: No  Neurological Headaches?: No Dizziness?: No  Psychologic Depression?: No Anxiety?: No  Physical Exam: BP (!) 152/75   Pulse 65   Ht 5\' 5"  (1.651 m)   Wt 140 lb 14.4 oz (63.9 kg)   BMI 23.45 kg/m   Constitutional: Well nourished. Alert and oriented, No acute distress. HEENT: [place] AT,  moist mucus membranes. Trachea midline, no masses. Cardiovascular: No clubbing, cyanosis, or edema. Respiratory: Normal respiratory effort, no increased work of breathing. GI: Abdomen is soft, non tender, non distended, no abdominal masses. GU: No CVA tenderness.  No bladder fullness or masses.   Skin: No rashes, bruises or suspicious lesions. Lymph: No cervical or inguinal adenopathy. Neurologic: Grossly intact, no focal deficits, moving all 4 extremities. Psychiatric: Normal mood and affect.  Laboratory Data: Urinalysis Many bactreria, 11-30 WBC's and yeast.  See EPIC.  I have reviewed the labs   Assessment & Plan:    1. Malignant neoplasm of urinary bladder, unspecified site ([place])  - Reviewed BCG treatment course, possible side effects including BCG sepsis, bladder irritation, worsening of her urinary symptoms  - # 2 of 3 maintenance BCG installed today  - Patient was instructed to pour bleach down her toilet for the next 6 hours  -  Instructed to call the office if she should experience fevers greater than 102, chills/rigors, onset of a new cough, night sweats or further bladder spasms or inability to urinate   - RTC in one week for # 3 maintenance BCG  - Urinalysis, Complete  - bcg vaccine injection 81 mg; Instill 3.24 mLs (81 mg total) into the bladder once.  - lidocaine (XYLOCAINE) 2 % jelly 1 application; Place 1  application into the urethra once.   2. CIS  - see above  - if patient has reoccurrence, cystectomy is recommended     Return in about 1 week (around 06/20/2017) for # 3 BCG.  These notes generated with voice recognition software. I apologize for typographical errors.  Zara Council, [facility] [address] [place], [place] [address] [phone]

## 2017-06-12 NOTE — Progress Notes (Signed)
Daily Session Note  Patient Details  Name: Adriana Anderson MRN: [id number] Date of Birth: 09-16-1941 Referring Provider:     Cardiac Rehab from 04/16/2017 in [facility]  Referring Provider  Isaias Cowman MD      Encounter Date: 06/12/2017  Check In:     Session Check In - 06/12/17 0847      Check-In   Location [facility]   Staff Present Heath Lark, RN, BSN, CCRP;Nessie Nong Luan Pulling, MA, ACSM RCEP, Exercise Physiologist;Amanda Oletta Darter, BA, ACSM CEP, Exercise Physiologist   Supervising physician immediately available to respond to emergencies See telemetry face sheet for immediately available ER MD   Medication changes reported     No   Fall or balance concerns reported    No   Warm-up and Cool-down Performed on first and last piece of equipment   Resistance Training Performed Yes   VAD Patient? No     Pain Assessment   Currently in Pain? No/denies         History  Smoking Status  . Former Smoker  . Quit date: 04/20/1982  Smokeless Tobacco  . Never Used    Goals Met:  Independence with exercise equipment Exercise tolerated well No report of cardiac concerns or symptoms Strength training completed today  Goals Unmet:  Not Applicable  Comments: Pt able to follow exercise prescription today without complaint.  Will continue to monitor for progression.    Dr. Emily Filbert is Medical Director for [facility].

## 2017-06-13 ENCOUNTER — Encounter: Payer: Self-pay | Admitting: Urology

## 2017-06-13 ENCOUNTER — Encounter: Payer: Self-pay | Admitting: *Deleted

## 2017-06-13 ENCOUNTER — Ambulatory Visit: Payer: Medicare Other | Admitting: Urology

## 2017-06-13 VITALS — BP 152/75 | HR 65 | Ht 65.0 in | Wt 140.9 lb

## 2017-06-13 DIAGNOSIS — C679 Malignant neoplasm of bladder, unspecified: Secondary | ICD-10-CM | POA: Diagnosis not present

## 2017-06-13 DIAGNOSIS — D090 Carcinoma in situ of bladder: Secondary | ICD-10-CM | POA: Diagnosis not present

## 2017-06-13 DIAGNOSIS — Z951 Presence of aortocoronary bypass graft: Secondary | ICD-10-CM

## 2017-06-13 LAB — MICROSCOPIC EXAMINATION: RBC, UA: NONE SEEN /hpf (ref 0–?)

## 2017-06-13 LAB — URINALYSIS, COMPLETE
Bilirubin, UA: NEGATIVE
GLUCOSE, UA: NEGATIVE
KETONES UA: NEGATIVE
Nitrite, UA: NEGATIVE
Protein, UA: NEGATIVE
Urobilinogen, Ur: 0.2 mg/dL (ref 0.2–1.0)
pH, UA: 6 (ref 5.0–7.5)

## 2017-06-13 MED ORDER — BCG LIVE 50 MG IS SUSR
3.2400 mL | Freq: Once | INTRAVESICAL | Status: AC
  Administered 2017-06-13: 81 mg via INTRAVESICAL

## 2017-06-13 MED ORDER — LIDOCAINE HCL 2 % EX GEL
1.0000 "application " | Freq: Once | CUTANEOUS | Status: AC
  Administered 2017-06-13: 1 via URETHRAL

## 2017-06-13 NOTE — Progress Notes (Signed)
BCG Bladder Instillation  BCG # 2 of 3  Due to Bladder Cancer patient is present today for a BCG treatment. Patient was cleaned and prepped in a sterile fashion with betadine and lidocaine 2% jelly was instilled into the urethra.  A 14FR catheter was inserted, urine return was noted 100ml, urine was yellow in color.  50ml of reconstituted BCG was instilled into the bladder. The catheter was then removed. Patient tolerated well, no complications were noted.  Preformed by: Shannon McGowan PA-C and Caden Fukushima CMA  Follow up/ Additional notes: One week   

## 2017-06-13 NOTE — Progress Notes (Signed)
Cardiac Individual Treatment Plan  Patient Details  Name: Adriana Anderson MRN: [id number] Date of Birth: [birth year] Referring Provider:     Cardiac Rehab from 04/16/2017 in [facility]  Referring Provider  Isaias Cowman MD      Initial Encounter Date:    Cardiac Rehab from 04/16/2017 in [facility]  Date  04/16/17  Referring Provider  Isaias Cowman MD      Visit Diagnosis: S/P CABG x 3  Patient's Home Medications on Admission:  Current Outpatient Prescriptions:  .  amoxicillin-clavulanate (AUGMENTIN) 875-125 MG tablet, Take 1 tablet by mouth every 12 (twelve) hours. (Patient not taking: Reported on 06/13/2017), Disp: 14 tablet, Rfl: 0 .  apixaban (ELIQUIS) 5 MG TABS tablet, Take by mouth., Disp: , Rfl:  .  aspirin EC 81 MG tablet, Take 81 mg by mouth at bedtime., Disp: , Rfl:  .  atorvastatin (LIPITOR) 40 MG tablet, Take by mouth., Disp: , Rfl:  .  calcium carbonate (TUMS EXTRA STRENGTH 750) 750 MG chewable tablet, Chew 2,250-3,750 tablets by mouth at bedtime as needed for heartburn., Disp: , Rfl:  .  Calcium Carbonate-Vitamin D (CALTRATE 600+D PO), Take 1 tablet by mouth 2 (two) times daily., Disp: , Rfl:  .  estradiol (ESTRACE VAGINAL) 0.1 MG/GM vaginal cream, USE 1 APPLICATION VAGINALLY ONCE A WEEK, Disp: 42.5 g, Rfl: 3 .  loratadine (CLARITIN) 10 MG tablet, Take 10 mg by mouth daily as needed for allergies., Disp: , Rfl:  .  Meth-Hyo-M Bl-Na Phos-Ph Sal (URIBEL) 118 MG CAPS, Take 1 capsule (118 mg total) by mouth QID., Disp: 40 capsule, Rfl: 3 .  metoprolol tartrate (LOPRESSOR) 25 MG tablet, Take by mouth., Disp: , Rfl:  .  omeprazole-sodium bicarbonate (ZEGERID) 40-1100 MG capsule, Take 1 capsule by mouth 2 (two) times daily. 20 instesd of 40, Disp: , Rfl:  .  ondansetron (ZOFRAN ODT) 4 MG disintegrating tablet, Take 1 tablet (4 mg total) by mouth every 8 (eight) hours as needed for nausea or vomiting., Disp: 20 tablet, Rfl:  0 .  ondansetron (ZOFRAN) 4 MG tablet, Take 1 tablet (4 mg total) by mouth every 8 (eight) hours as needed for nausea or vomiting. (Patient not taking: Reported on 05/30/2017), Disp: 20 tablet, Rfl: 0 .  polyethylene glycol powder (GLYCOLAX/MIRALAX) powder, Take 17 g by mouth every other day. , Disp: , Rfl:  .  Probiotic Product (PROBIOTIC DAILY PO), Take 1 capsule by mouth daily., Disp: , Rfl:  .  ranitidine (ZANTAC) 150 MG tablet, Take 150 mg by mouth at bedtime as needed for heartburn., Disp: , Rfl:  .  solifenacin (VESICARE) 10 MG tablet, Take 1 tablet (10 mg total) by mouth daily., Disp: 90 tablet, Rfl: 0  Past Medical History: Past Medical History:  Diagnosis Date  . Acid reflux   . Bladder cancer ([place])   . Bladder mass   . Diabetes mellitus, type 2 ([place])   . GERD (gastroesophageal reflux disease)   . History of biopsy of bladder   . History of cystoscopy   . Liver disease   . PONV (postoperative nausea and vomiting)     Tobacco Use: History  Smoking Status  . Former Smoker  . Quit date: 04/20/1982  Smokeless Tobacco  . Never Used    Labs: Recent Review Flowsheet Data    There is no flowsheet data to display.       Exercise Target Goals:    Exercise Program Goal: Individual exercise  prescription set with THRR, safety & activity barriers. Participant demonstrates ability to understand and report RPE using BORG scale, to self-measure pulse accurately, and to acknowledge the importance of the exercise prescription.  Exercise Prescription Goal: Starting with aerobic activity 30 plus minutes a day, 3 days per week for initial exercise prescription. Provide home exercise prescription and guidelines that participant acknowledges understanding prior to discharge.  Activity Barriers & Risk Stratification:     Activity Barriers & Cardiac Risk Stratification - 04/16/17 1048      Activity Barriers & Cardiac Risk Stratification   Activity Barriers Back  Problems;Deconditioning;Muscular Weakness;Shortness of Breath;Balance Concerns;Neck/Spine Problems  low back pain, when she reaches up she has neck pain bilaterally   Cardiac Risk Stratification High      6 Minute Walk:     6 Minute Walk    Row Name 04/16/17 1444         6 Minute Walk   Phase Initial     Distance 930 feet     Walk Time 5.8 minutes     # of Rest Breaks 1  12 sec     MPH 1.82     METS 1.82     RPE 13     Perceived Dyspnea  2     VO2 Peak 6.39     Symptoms Yes (comment)     Comments fatigue and SOB     Resting HR 64 bpm     Resting BP 128/78     Max Ex. HR 72 bpm     Max Ex. BP 146/76     2 Minute Post BP 132/72        Oxygen Initial Assessment:     Oxygen Initial Assessment - 04/16/17 1208      Home Oxygen   Home Oxygen Device None     Initial 6 min Walk   Oxygen Used None      Oxygen Re-Evaluation:   Oxygen Discharge (Final Oxygen Re-Evaluation):   Initial Exercise Prescription:     Initial Exercise Prescription - 04/16/17 1400      Date of Initial Exercise RX and Referring Provider   Date 04/16/17   Referring Provider Paraschos, Alexander MD     Treadmill   MPH 1.1   Grade 0   Minutes 15   METs 1.84     NuStep   Level 1   SPM 80   Minutes 15   METs 1.8     Biostep-RELP   Level 1   SPM 50   Minutes 15   METs 2     Prescription Details   Frequency (times per week) 2   Duration Progress to 45 minutes of aerobic exercise without signs/symptoms of physical distress     Intensity   THRR 40-80% of Max Heartrate 96-129   Ratings of Perceived Exertion 11-13   Perceived Dyspnea 0-4     Progression   Progression Continue to progress workloads to maintain intensity without signs/symptoms of physical distress.     Resistance Training   Training Prescription Yes   Weight 2 lbs   Reps 10-15      Perform Capillary Blood Glucose checks as needed.  Exercise Prescription Changes:     Exercise Prescription Changes     Row Name 04/16/17 1400 04/25/17 1400 05/10/17 1500 05/15/17 0900 05/23/17 1500     Response to Exercise   Blood Pressure (Admit) 128/78 126/70 130/66  - 128/68   Blood Pressure (Exercise) 146/76 120/70 130/66  -  134/76   Blood Pressure (Exit) 132/72 124/58 122/66  - 126/72   Heart Rate (Admit) 64 bpm 68 bpm 65 bpm  - 66 bpm   Heart Rate (Exercise) 72 bpm 92 bpm 96 bpm  - 80 bpm   Heart Rate (Exit) 59 bpm 57 bpm 62 bpm  - 63 bpm   Oxygen Saturation (Admit) 98 %  -  -  -  -   Oxygen Saturation (Exercise) 100 %  -  -  -  -   Rating of Perceived Exertion (Exercise) 13 13 14   - 11   Perceived Dyspnea (Exercise) 2 2  -  -  -   Symptoms fatigue and SOB none none  - none   Comments walk test results  -  -  -  -   Duration  - Progress to 45 minutes of aerobic exercise without signs/symptoms of physical distress Continue with 45 min of aerobic exercise without signs/symptoms of physical distress.  - Continue with 45 min of aerobic exercise without signs/symptoms of physical distress.   Intensity  - THRR unchanged THRR unchanged  - THRR unchanged     Progression   Progression  - Continue to progress workloads to maintain intensity without signs/symptoms of physical distress. Continue to progress workloads to maintain intensity without signs/symptoms of physical distress.  - Continue to progress workloads to maintain intensity without signs/symptoms of physical distress.   Average METs  - 2.08 2.09  - 2.25     Resistance Training   Training Prescription  - Yes Yes  - Yes   Weight  - 2 lbs 2 lbs  - 3 lbs   Reps  - 10-15 10-15  - 10-15     Interval Training   Interval Training  - No No  - No     Treadmill   MPH  - 1.5 2  - 2   Grade  - 0.5 0.5  - 0.5   Minutes  - 15 15  - 15   METs  - 2.25 2.67  - 2.67     NuStep   Level  - 1 1  - 3   Minutes  - 15 15  - 15   METs  - 2 1.9  - 2.1     Biostep-RELP   Level  - 1 1  - 1   Minutes  - 15 15  - 15   METs  - 2 2  - 2     Home Exercise Plan    Plans to continue exercise at  -  -  - Home (comment)  walking Home (comment)  walking   Frequency  -  -  - Add 3 additional days to program exercise sessions. Add 3 additional days to program exercise sessions.   Initial Home Exercises Provided  -  -  - 05/15/17 05/15/17   Row Name 06/05/17 1600             Response to Exercise   Blood Pressure (Admit) 120/58       Blood Pressure (Exercise) 126/64       Blood Pressure (Exit) 128/60       Heart Rate (Admit) 78 bpm       Heart Rate (Exercise) 91 bpm       Heart Rate (Exit) 67 bpm       Rating of Perceived Exertion (Exercise) 11       Symptoms none       Duration  Continue with 45 min of aerobic exercise without signs/symptoms of physical distress.       Intensity THRR unchanged         Progression   Progression Continue to progress workloads to maintain intensity without signs/symptoms of physical distress.       Average METs 2.19         Resistance Training   Training Prescription Yes       Weight 3 lbs       Reps 10-15         Interval Training   Interval Training No         Treadmill   MPH 2       Grade 0.5       Minutes 15       METs 2.67         NuStep   Level 3       Minutes 15       METs 2         Biostep-RELP   Level 1       Minutes 15       METs 2         Home Exercise Plan   Plans to continue exercise at Home (comment)  walking       Frequency Add 3 additional days to program exercise sessions.       Initial Home Exercises Provided 05/15/17          Exercise Comments:     Exercise Comments    Row Name 04/17/17 0951           Exercise Comments First full day of exercise!  Patient was oriented to gym and equipment including functions, settings, policies, and procedures.  Patient's individual exercise prescription and treatment plan were reviewed.  All starting workloads were established based on the results of the 6 minute walk test done at initial orientation visit.  The plan for exercise  progression was also introduced and progression will be customized based on patient's performance and goals.          Exercise Goals and Review:     Exercise Goals    Row Name 04/16/17 1453             Exercise Goals   Increase Physical Activity Yes       Intervention Provide advice, education, support and counseling about physical activity/exercise needs.;Develop an individualized exercise prescription for aerobic and resistive training based on initial evaluation findings, risk stratification, comorbidities and participant's personal goals.       Expected Outcomes Achievement of increased cardiorespiratory fitness and enhanced flexibility, muscular endurance and strength shown through measurements of functional capacity and personal statement of participant.       Increase Strength and Stamina Yes       Intervention Provide advice, education, support and counseling about physical activity/exercise needs.;Develop an individualized exercise prescription for aerobic and resistive training based on initial evaluation findings, risk stratification, comorbidities and participant's personal goals.       Expected Outcomes Achievement of increased cardiorespiratory fitness and enhanced flexibility, muscular endurance and strength shown through measurements of functional capacity and personal statement of participant.          Exercise Goals Re-Evaluation :     Exercise Goals Re-Evaluation    Row Name 04/25/17 1441 05/10/17 1542 05/15/17 0946 05/23/17 1549 05/29/17 1316     Exercise Goal Re-Evaluation   Exercise Goals Review Increase Strenth and Stamina;Increase Physical Activity Increase  Strenth and Stamina;Increase Physical Activity Increase Strenth and Stamina;Increase Physical Activity Increase Strenth and Stamina;Increase Physical Activity Increase Physical Activity;Increase Strength and Stamina;Understanding of Exercise Prescription   Comments Darlisa is off to a good start in rehab.  She  has completed 3 full days of exercise.  She is eager to get moving and has already noticed things getting easier.  We will continue to monitor her progression. Modesty continues to do well in rehab.  She felt a little more nauseated than normal today.  She did back off some of her workloads todays.  We will continue to monitor her progress.  Reviewed home exercise with pt today.  Pt plans to walk at home for exercise.  Reviewed THR, pulse, RPE, sign and symptoms, NTG use, and when to call 911 or MD.  Also discussed weather considerations and indoor options.  Pt voiced understanding. Hanley Ben has been doing well in rehab.  She has started to add in walking at home.  She is now up to level 3 on the NuStep.  We will continue to monitor her progression.  Rylynne continues to do fairly well in rehab.  She is not doing much activity at home as any time she moves she starts to feel nausea.  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  She know that exercise is good for her, but she is not able to move around much.  She is able to do the machines with less nausea.     Expected Outcomes Short: Move up workloads on all pieces.  Long: Continue to exercise on her own at home. Short: Move up workloads.  Long: Continue to walk more at home.  Short: Add in home exercise.  Long: Walk more regularly. Short: Continue work on increasing work loads.  Long: Exercise independently at home.  Short: Continue to try to exercise more.  Long: Continue to increase strength and stamina.    [place] Name 06/05/17 1620             Exercise Goal Re-Evaluation   Exercise Goals Review Increase Physical Activity;Increase Strength and Stamina       Comments Honestee has been doing well in rehab.   She continues to have nausea with movement.  We will try to start to increase some of her workloads more on the seated machines.  Continue to monitor her progression.        Expected Outcomes Short: Try  to increase some workloads.  Long: Try to exercise more at home.           Discharge Exercise Prescription (Final Exercise Prescription Changes):     Exercise Prescription Changes - 06/05/17 1600      Response to Exercise   Blood Pressure (Admit) 120/58   Blood Pressure (Exercise) 126/64   Blood Pressure (Exit) 128/60   Heart Rate (Admit) 78 bpm   Heart Rate (Exercise) 91 bpm   Heart Rate (Exit) 67 bpm   Rating of Perceived Exertion (Exercise) 11   Symptoms none   Duration Continue with 45 min of aerobic exercise without signs/symptoms of physical distress.   Intensity THRR unchanged     Progression   Progression Continue to progress workloads to maintain intensity without signs/symptoms of physical distress.   Average METs 2.19     Resistance Training   Training Prescription Yes   Weight 3 lbs   Reps 10-15     Interval  Training   Interval Training No     Treadmill   MPH 2   Grade 0.5   Minutes 15   METs 2.67     NuStep   Level 3   Minutes 15   METs 2     Biostep-RELP   Level 1   Minutes 15   METs 2     Home Exercise Plan   Plans to continue exercise at Home (comment)  walking   Frequency Add 3 additional days to program exercise sessions.   Initial Home Exercises Provided 05/15/17      Nutrition:  Target Goals: Understanding of nutrition guidelines, daily intake of sodium <1590m, cholesterol <2058m calories 30% from fat and 7% or less from saturated fats, daily to have 5 or more servings of fruits and vegetables.  Biometrics:     Pre Biometrics - 04/16/17 1453      Pre Biometrics   Height 5' 4.7" (1.643 m)   Weight 145 lb 6.4 oz (66 kg)   Waist Circumference 34.5 inches   Hip Circumference 38 inches   Waist to Hip Ratio 0.91 %   BMI (Calculated) 24.5   Single Leg Stand 5.83 seconds       Nutrition Therapy Plan and Nutrition Goals:     Nutrition Therapy & Goals - 04/16/17 1208      Nutrition Therapy   Drug/Food Interactions  Statins/Certain Fruits     Intervention Plan   Expected Outcomes Short Term Goal: Understand basic principles of dietary content, such as calories, fat, sodium, cholesterol and nutrients.      Nutrition Discharge: Rate Your Plate Scores:     Nutrition Assessments - 04/16/17 1047      MEDFICTS Scores   Pre Score 31      Nutrition Goals Re-Evaluation:     Nutrition Goals Re-Evaluation    Row Name 05/29/17 1342             Goals   Nutrition Goal Use written tips provided to help with controlling nausea, eat every 2-4 hrs, try protein drink, fruits and vegetables       Comment JuAnizaas not tried the tips for nausea.  She is taking zofram now 1-2 times a day.  She is eating some more now and able to tolerate cottage cheese.  She is also able to eat some fruits as well.  She drinks miralax daily with fruit juice.  She continues to have a poor appetite but forces herself to eat.   She has also not tried the protein drink as the one she tried in the past was too sweet.  We talked about trying all the different kinds on the market.       Expected Outcome Short: Try a protein drink.  Long: Try to combat her nausea and improve her appeptite.           Nutrition Goals Discharge (Final Nutrition Goals Re-Evaluation):     Nutrition Goals Re-Evaluation - 05/29/17 1342      Goals   Nutrition Goal Use written tips provided to help with controlling nausea, eat every 2-4 hrs, try protein drink, fruits and vegetables   Comment JuNakeaas not tried the tips for nausea.  She is taking zofram now 1-2 times a day.  She is eating some more now and able to tolerate cottage cheese.  She is also able to eat some fruits as well.  She drinks miralax daily with fruit juice.  She continues to have  a poor appetite but forces herself to eat.   She has also not tried the protein drink as the one she tried in the past was too sweet.  We talked about trying all the different kinds on the market.   Expected  Outcome Short: Try a protein drink.  Long: Try to combat her nausea and improve her appeptite.       Psychosocial: Target Goals: Acknowledge presence or absence of significant depression and/or stress, maximize coping skills, provide positive support system. Participant is able to verbalize types and ability to use techniques and skills needed for reducing stress and depression.   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       Quality of Life Scores:      Quality of Life - 04/16/17 1043      Quality of Life Scores   Health/Function Pre 16.96 %   Socioeconomic Pre 27.14 %   Psych/Spiritual Pre 29.29 %   Family Pre 30 %   GLOBAL Pre 23.71 %      PHQ-9: Recent Review Flowsheet Data    Depression screen [facility] 2/9 04/16/2017   Decreased Interest 0   Down, Depressed, Hopeless 0   PHQ - 2 Score 0   Altered sleeping 0   Tired, decreased energy 3   Change in appetite 2   Feeling bad or failure about yourself  0   Trouble concentrating 0   Moving slowly or fidgety/restless 0   Suicidal thoughts 0   PHQ-9 Score 5   Difficult doing work/chores Somewhat difficult     Interpretation of Total Score  Total Score Depression Severity:  1-4 = Minimal depression, 5-9 = Mild depression, 10-14 = Moderate depression, 15-19 = Moderately severe depression, 20-27 = Severe depression   Psychosocial Evaluation and Intervention:     Psychosocial Evaluation - 05/03/17 0925      Psychosocial Evaluation & Interventions   Interventions Encouraged to exercise with the program and follow exercise prescription;Relaxation education;Stress management education   Comments Counselor met with Ms. Umana Bethena Roys) today for initial psychosocial evaluation.  She is a 76 year old who had a CABGx3 on June 6th.  Anoushka has a strong support system with  a spouse of 59 years; (2) adult daughters who live close by and a daughter in [place] who is a Marine scientist checks in on [place] frequently.  Eleanor was diagnosed with bladder cancer 2 years ago and is currently cancer free.  She sleeps well and reports her appetite has been "terrible" since the surgery with increased nausea.  She is taking something for that every evening and states it helps somewhat.  Marvette denies a history of depression or anxiety or any current symptoms.  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  Counselor encouraged Emberlee to "delegate" better with family members for some of these tasks until Briana is able to do them herself.  She has goals to get her strength back while in this program.  Staff will follow with Bethena Roys.   Expected Outcomes Mecca will benefit from consistent exercise to achieve her stated goals.  The educational and psychoeducational components of this class will be helpful  in understanding and managing her diagnoses better.  Talani will benefit especially from the stress management education to learn improved self care in order to be more assertive in accepting her limitations and asking for help.        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  They have delegated out the yard care to a neighbor boy.  However, both her and her husband continue to sit around at home not doing too much with his health and her nausea.  They nausea is very frustrating.  She is about ready to give up as she doesn't want to keeping living with  nausea.  We talked about sitting down with her pharmacist to look at her meds to help.  She has noticed that exercise has improved her strength and stamina some, but not enough as she is still not able to go out shopping with her friend after they share a meal.      Expected Outcomes  - Short: Talk to pharamacist about nausea.  Long: Build strength and stamina to be able to work on farm.      Interventions Encouraged to attend Cardiac Rehabilitation for the exercise Encouraged to attend Cardiac Rehabilitation for the exercise;Stress management education      Continue Psychosocial Services   - Follow up required by staff      Comments Zandrea wants to work in her yard and hopes to now when rain stops since she said since working out in Cardiac Rehab that she should be able to work out in her yard also plus the farm.  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  They have delegated out the yard care to a neighbor boy.  However, both her and her husband continue to sit around at home not doing too much with his health and her nausea.  They nausea is very frustrating.  She is about ready to give up as she doesn't want to keeping living with nausea.  We talked about sitting down with her pharmacist to look at her meds to help.  She has noticed that exercise has improved her strength and stamina some, but not enough as she is still not able to go out shopping with her friend after they share a meal.   Expected Outcomes Short: Talk to pharamacist about nausea.  Long: Build strength and stamina to be able to work on farm.    Interventions Encouraged to attend Cardiac Rehabilitation for the exercise;Stress management education   Continue Psychosocial Services  Follow up required by staff     Initial Review   Source of Stress Concerns Chronic Illness;Unable to  perform yard/household activities      Vocational Rehabilitation: Provide vocational rehab assistance to qualifying candidates.   Vocational Rehab Evaluation & Intervention:     Vocational Rehab - 04/16/17 1047      Initial Vocational Rehab Evaluation & Intervention   Assessment shows need for Vocational Rehabilitation No      Education: Education Goals: Education classes will be provided on a variety of topics geared toward better understanding of heart health and risk factor modification. Participant will state understanding/return demonstration of topics presented as noted by education test scores.  Learning Barriers/Preferences:     Learning Barriers/Preferences - 04/16/17 1048      Learning Barriers/Preferences   Learning Barriers Hearing   Learning Preferences Individual Instruction      Education Topics: General Nutrition Guidelines/Fats and Fiber: -Group instruction provided by verbal, written material, models and posters to present the general guidelines for heart healthy nutrition. Gives an explanation and review of dietary fats and fiber.   Cardiac Rehab from 06/12/2017 in [facility] Cardiac and Pulmonary Rehab  Date  05/22/17  Educator  CR  Instruction Review Code  1- Verbalizes Understanding      Controlling Sodium/Reading Food Labels: -Group verbal and written material supporting the discussion of sodium use in heart healthy nutrition. Review and explanation with models, verbal and written materials for utilization of the food label.   Cardiac Rehab from 06/12/2017 in [facility] Cardiac and Pulmonary Rehab  Date  05/29/17  Educator  PI  Instruction Review Code  2- Demonstrated Understanding      Exercise Physiology & Risk  Factors: - Group verbal and written instruction with models to review the exercise physiology of the cardiovascular system and associated critical values. Details cardiovascular disease risk factors and the goals associated with each risk factor.   Cardiac Rehab from 06/12/2017 in [facility] Cardiac and Pulmonary Rehab  Date  06/07/17  Educator  [facility]  Instruction Review Code  1- Verbalizes Understanding      Aerobic Exercise & Resistance Training: - Gives group verbal and written discussion on the health impact of inactivity. On the components of aerobic and resistive training programs and the benefits of this training and how to safely progress through these programs.   Cardiac Rehab from 06/12/2017 in [facility] Cardiac and Pulmonary Rehab  Date  06/12/17  Educator  [facility]  Instruction Review Code  1- Verbalizes Understanding      Flexibility, Balance, General Exercise Guidelines: - Provides group verbal and written instruction on the benefits of flexibility and balance training programs. Provides general exercise guidelines with specific guidelines to those with heart or lung disease. Demonstration and skill practice provided.   Cardiac Rehab from 06/12/2017 in [facility] Cardiac and Pulmonary Rehab  Date  04/17/17  Educator  AS      Stress Management: - Provides group verbal and written instruction about the health risks of elevated stress, cause of high stress, and healthy ways to reduce stress.   Cardiac Rehab from 06/12/2017 in [facility] Cardiac and Pulmonary Rehab  Date  04/24/17  Educator  [place]      Depression: - Provides group verbal and written instruction on the correlation between heart/lung disease and depressed mood, treatment options, and the stigmas associated with seeking treatment.   Cardiac Rehab from 06/12/2017 in [facility] Cardiac and Pulmonary Rehab  Date  05/17/17  Educator  [facility]  Instruction Review Code (retired)  2- meets Designer, fashion/clothing & Physiology of the Heart: - Group  verbal  and written instruction and models provide basic cardiac anatomy and physiology, with the coronary electrical and arterial systems. Review of: AMI, Angina, Valve disease, Heart Failure, Cardiac Arrhythmia, Pacemakers, and the ICD.   Cardiac Procedures: - Group verbal and written instruction to review commonly prescribed medications for heart disease. Reviews the medication, class of the drug, and side effects. Includes the steps to properly store meds and maintain the prescription regimen. (beta blockers and nitrates)   Cardiac Rehab from 06/12/2017 in [facility] Cardiac and Pulmonary Rehab  Date  05/01/17  Educator  CE      Cardiac Medications I: - Group verbal and written instruction to review commonly prescribed medications for heart disease. Reviews the medication, class of the drug, and side effects. Includes the steps to properly store meds and maintain the prescription regimen.   Cardiac Rehab from 06/12/2017 in [facility] Cardiac and Pulmonary Rehab  Date  05/08/17 [8/7 Part One 8/9 Part Two]  Educator  SB      Cardiac Medications II: -Group verbal and written instruction to review commonly prescribed medications for heart disease. Reviews the medication, class of the drug, and side effects. (all other drug classes)    Go Sex-Intimacy & Heart Disease, Get SMART - Goal Setting: - Group verbal and written instruction through game format to discuss heart disease and the return to sexual intimacy. Provides group verbal and written material to discuss and apply goal setting through the application of the S.M.A.R.T. Method.   Cardiac Rehab from 06/12/2017 in [facility] Cardiac and Pulmonary Rehab  Date  05/01/17  Educator  CE      Other Matters of the Heart: - Provides group verbal, written materials and models to describe Heart Failure, Angina, Valve Disease, Peripheral Artery Disease, and Diabetes in the realm of heart disease. Includes description of the disease process and treatment options  available to the cardiac patient.   Exercise & Equipment Safety: - Individual verbal instruction and demonstration of equipment use and safety with use of the equipment.   Cardiac Rehab from 06/12/2017 in [facility] Cardiac and Pulmonary Rehab  Date  04/16/17  Educator  C. EnterkinRN      Infection Prevention: - Provides verbal and written material to individual with discussion of infection control including proper hand washing and proper equipment cleaning during exercise session.   Cardiac Rehab from 06/12/2017 in [facility] Cardiac and Pulmonary Rehab  Date  04/16/17  Educator  C. EnterkinRN      Falls Prevention: - Provides verbal and written material to individual with discussion of falls prevention and safety.   Cardiac Rehab from 06/12/2017 in [facility] Cardiac and Pulmonary Rehab  Date  04/16/17  Educator  C. [place]  Instruction Review Code (retired)  2- meets goals/outcomes      Diabetes: - Individual verbal and written instruction to review signs/symptoms of diabetes, desired ranges of glucose level fasting, after meals and with exercise. Acknowledge that pre and post exercise glucose checks will be done for 3 sessions at entry of program.   Cardiac Rehab from 06/12/2017 in [facility] Cardiac and Pulmonary Rehab  Date  04/16/17  Educator  C. Liliana Dang,RN [diet controlled diabetes-no medicines for it.]      Other: -Provides group and verbal instruction on various topics (see comments)    Knowledge Questionnaire Score:     Knowledge Questionnaire Score - 04/16/17 1045      Knowledge Questionnaire Score   Pre Score 26/28      Core Components/Risk Factors/Patient Goals at Admission:  Personal Goals and Risk Factors at Admission - 04/16/17 1208      Core Components/Risk Factors/Patient Goals on Admission    Weight Management Yes;Weight Maintenance   Intervention Weight Management: Develop a combined nutrition and exercise program designed to reach desired caloric intake,  while maintaining appropriate intake of nutrient and fiber, sodium and fats, and appropriate energy expenditure required for the weight goal.;Weight Management: Provide education and appropriate resources to help participant work on and attain dietary goals.   Admit Weight 145 lb 6.4 oz (66 kg)   Goal Weight: Short Term 145 lb (65.8 kg)   Goal Weight: Long Term 142 lb (64.4 kg)   Expected Outcomes Long Term: Adherence to nutrition and physical activity/exercise program aimed toward attainment of established weight goal;Weight Maintenance: Understanding of the daily nutrition guidelines, which includes 25-35% calories from fat, 7% or less cal from saturated fats, less than 264m cholesterol, less than 1.5gm of sodium, & 5 or more servings of fruits and vegetables daily;Short Term: Continue to assess and modify interventions until short term weight is achieved   Improve shortness of breath with ADL's Yes  c/o being tired also   Intervention Provide education, individualized exercise plan and daily activity instruction to help decrease symptoms of SOB with activities of daily living.   Expected Outcomes Short Term: Achieves a reduction of symptoms when performing activities of daily living.   Diabetes Yes   Intervention Provide education about signs/symptoms and action to take for hypo/hyperglycemia.;Provide education about proper nutrition, including hydration, and aerobic/resistive exercise prescription along with prescribed medications to achieve blood glucose in normal ranges: Fasting glucose 65-99 mg/dL   Expected Outcomes Short Term: Participant verbalizes understanding of the signs/symptoms and immediate care of hyper/hypoglycemia, proper foot care and importance of medication, aerobic/resistive exercise and nutrition plan for blood glucose control.;Long Term: Attainment of HbA1C < 7%.      Core Components/Risk Factors/Patient Goals Review:      Goals and Risk Factor Review    Row Name  05/01/17 0936 05/29/17 1330           Core Components/Risk Factors/Patient Goals Review   Personal Goals Review Weight Management/Obesity Weight Management/Obesity;Diabetes;Hypertension      Review wt 152 today even though JAlletareports that she is not eating well and will go to a Cardiac REhab REgisterd Dietician appt. JMargaruitesaid since her CABG her neck hurts when she reaching for something in her cabinet. ANada Maclachlan EP suggested some neck stretches and reminded her when reading to keep good posture.  JLatricahas lost some weight, but it has slowed as she is starting to be able to eat a littl bit more now.  She has not taken her blood sugar in a while, but has not felt any major swings.  Her meds may be the cause of her nausea and we discussed sitting down with her pharmacist to review her meds.  Her blood pressures have been good.      Expected Outcomes Heart healthy living  Short: Talk with pharmacist   Long: Continue to work on risk factor modification.         Core Components/Risk Factors/Patient Goals at Discharge (Final Review):      Goals and Risk Factor Review - 05/29/17 1330      Core Components/Risk Factors/Patient Goals Review   Personal Goals Review Weight Management/Obesity;Diabetes;Hypertension   Review JJakalahas lost some weight, but it has slowed as she is starting to be able to eat a littl bit  more now.  She has not taken her blood sugar in a while, but has not felt any major swings.  Her meds may be the cause of her nausea and we discussed sitting down with her pharmacist to review her meds.  Her blood pressures have been good.   Expected Outcomes Short: Talk with pharmacist   Long: Continue to work on risk factor modification.      ITP Comments:     ITP Comments    Row Name 04/16/17 1206 04/16/17 1214 04/18/17 0653 05/16/17 0618 05/22/17 1115   ITP Comments Medical Review ITP created during Orientation after Cardiac REhab informed consent was signed at 10:28am today.  CABG x3 diagnosis documented in [facility] 03/17/2017 [facility] Encounter where her surgery was done.  Zhavia c/o her neck hurting when she lifts her arms up over her head. Kyrstan reports that she has been careful not to lift over 5lbs and her husband even weighed her purse which did not weigh more than 5 lbs. 30 day review. Continue with ITP unless directed changes per Medical Director review     New to program 30 day review. Continue with ITP unless directed changes per Medical Director review  Anju is on an antibiotic for a UTI.  She is experiencing nausea and has decreased her workloads to keep the nausea away.  She did fine today with lower level workloads.    [place] Name 06/13/17 1117           ITP Comments 30 day review. Continue with ITP unless directed changes per Medical Director review.            Comments:

## 2017-06-14 ENCOUNTER — Encounter: Payer: Medicare Other | Admitting: *Deleted

## 2017-06-14 DIAGNOSIS — Z951 Presence of aortocoronary bypass graft: Secondary | ICD-10-CM | POA: Diagnosis not present

## 2017-06-14 NOTE — Progress Notes (Signed)
Daily Session Note  Patient Details  Name: Adriana Anderson MRN: [id number] Date of Birth: Feb 04, 1941 Referring Provider:     Cardiac Rehab from 04/16/2017 in [facility] Cardiac and Pulmonary Rehab  Referring Provider  Isaias Cowman MD      Encounter Date: 06/14/2017  Check In:     Session Check In - 06/14/17 0831      Check-In   Location [facility]-Cardiac & Pulmonary Rehab   Staff Present Alberteen Sam, MA, ACSM RCEP, Exercise Physiologist;Amanda Oletta Darter, BA, ACSM CEP, Exercise Physiologist;Meredith Sherryll Burger, RN BSN   Supervising physician immediately available to respond to emergencies See telemetry face sheet for immediately available ER MD   Medication changes reported     No   Fall or balance concerns reported    No   Warm-up and Cool-down Performed on first and last piece of equipment   Resistance Training Performed Yes   VAD Patient? No     Pain Assessment   Currently in Pain? No/denies   Multiple Pain Sites No         History  Smoking Status  . Former Smoker  . Quit date: 04/20/1982  Smokeless Tobacco  . Never Used    Goals Met:  Independence with exercise equipment Exercise tolerated well No report of cardiac concerns or symptoms Strength training completed today  Goals Unmet:  Not Applicable  Comments: Pt able to follow exercise prescription today without complaint.  Will continue to monitor for progression.    Dr. Emily Filbert is Medical Director for [place] and [facility].

## 2017-06-19 ENCOUNTER — Encounter: Payer: Medicare Other | Admitting: *Deleted

## 2017-06-19 VITALS — Ht 64.7 in | Wt 141.0 lb

## 2017-06-19 DIAGNOSIS — Z951 Presence of aortocoronary bypass graft: Secondary | ICD-10-CM | POA: Diagnosis not present

## 2017-06-19 NOTE — Progress Notes (Signed)
Daily Session Note  Patient Details  Name: Adriana Anderson MRN: [id number] Date of Birth: 1941/05/23 Referring Provider:     Cardiac Rehab from 04/16/2017 in [facility] Cardiac and Pulmonary Rehab  Referring Provider  Isaias Cowman MD      Encounter Date: 06/19/2017  Check In:     Session Check In - 06/19/17 0918      Check-In   Location [facility]-Cardiac & Pulmonary Rehab   Staff Present Heath Lark, RN, BSN, CCRP;Samier Jaco Luan Pulling, MA, ACSM RCEP, Exercise Physiologist;Amanda Oletta Darter, BA, ACSM CEP, Exercise Physiologist   Supervising physician immediately available to respond to emergencies See telemetry face sheet for immediately available ER MD   Medication changes reported     No   Fall or balance concerns reported    No   Warm-up and Cool-down Performed on first and last piece of equipment   Resistance Training Performed Yes   VAD Patient? No     Pain Assessment   Currently in Pain? No/denies   Multiple Pain Sites No         History  Smoking Status  . Former Smoker  . Quit date: 04/20/1982  Smokeless Tobacco  . Never Used    Goals Met:  Independence with exercise equipment Exercise tolerated well No report of cardiac concerns or symptoms Strength training completed today  Goals Unmet:  Not Applicable  Comments: Pt able to follow exercise prescription today without complaint.  Will continue to monitor for progression.     [place] Name 04/16/17 1444 06/19/17 0919       6 Minute Walk   Phase Initial Discharge    Distance 930 feet 1340 feet    Distance % Change  - 44.1 %    Distance Feet Change  - 410 ft    Walk Time 5.8 minutes 6 minutes    # of Rest Breaks 1  12 sec 0    MPH 1.82 2.53    METS 1.82 2.78    RPE 13 12    Perceived Dyspnea  2  -    VO2 Peak 6.39 9.72    Symptoms Yes (comment) No    Comments fatigue and SOB  -    Resting HR 64 bpm 73 bpm    Resting BP 128/78 132/86    Max Ex. HR 72 bpm 91 bpm    Max Ex. BP 146/76 132/64     2 Minute Post BP 132/72  -         Dr. Emily Filbert is Medical Director for [place] and [facility].

## 2017-06-19 NOTE — Progress Notes (Signed)
06/20/2017 12:09 PM   Linus Salmons 1940/12/11 [id number]  Referring provider: Madelyn Brunner, MD [facility] [place], [place] [address]  Chief Complaint  Patient presents with  . Other    BCG 3    HPI: Patient is a 76 year old Caucasian female who presents today for 3rd of 3 maintenance BCG.    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 Jan 2018 mBCG x 04 May 2017 - cysto NED - cytology negative  Today, she states she is having frequency, urgency, nocturia and incontinence. She is also having suprapubic pain.  She states that lasted the day of the BCG instillation and into the night.  By the next day the suprapubic pain abated.  She did hold the BCG for 3 hours.  She denies any gross hematuria, dysuria and suprapubic pain  At this time.  She has not had recent fevers, chills or vomiting.  She has not had a new cough.  Her UA today noted many bacteria and > 30 WBC's.    PMH: Past Medical History:  Diagnosis Date  . Acid reflux   . Bladder cancer ([place])   . Bladder mass   . Diabetes mellitus, type 2 ([place])   . GERD (gastroesophageal reflux disease)   . History of biopsy of bladder   . History of cystoscopy   . Liver disease   . PONV (postoperative nausea and vomiting)     Surgical History: Past Surgical History:  Procedure Laterality Date  . APPENDECTOMY    . CYSTOSCOPY WITH BIOPSY N/A 11/15/2015   Procedure: CYSTOSCOPY WITH BIOPSY;  Surgeon: [name], MD;  Location: [facility] ORS;  Service: Urology;  Laterality: N/A;  . LEFT HEART CATH AND  CORONARY ANGIOGRAPHY Left 02/23/2017   Procedure: Left Heart Cath and Coronary Angiography;  Surgeon: Isaias Cowman, MD;  Location: [place] [facility];  Service: Cardiovascular;  Laterality: Left;  . TOE SURGERY      Home Medications:  Allergies as of 06/20/2017      Reactions   Tetanus-diphth-acell Pertussis Swelling   Sulfa Antibiotics Rash      Medication List       Accurate as of 06/20/17 12:09 PM. Always use your most recent med list.          amoxicillin-clavulanate 875-125 MG tablet Commonly known as:  AUGMENTIN Take 1 tablet by mouth every 12 (twelve) hours.   apixaban 5 MG Tabs tablet Commonly known as:  ELIQUIS Take by mouth.   aspirin EC 81 MG tablet Take 81 mg by mouth at bedtime.   atorvastatin 40 MG tablet Commonly known as:  LIPITOR Take by mouth.   CALTRATE 600+D PO Take 1 tablet by mouth 2 (two) times daily.   estradiol 0.1 MG/GM vaginal cream Commonly known as:  ESTRACE VAGINAL USE 1 APPLICATION VAGINALLY ONCE A WEEK   loratadine 10 MG tablet Commonly known as:  CLARITIN Take 10 mg by mouth daily as needed for allergies.   metoprolol tartrate 25 MG tablet Commonly known as:  LOPRESSOR Take  by mouth.   omeprazole-sodium bicarbonate 40-1100 MG capsule Commonly known as:  ZEGERID Take 1 capsule by mouth 2 (two) times daily. 20 instesd of 40   ondansetron 4 MG disintegrating tablet Commonly known as:  ZOFRAN ODT Take 1 tablet (4 mg total) by mouth every 8 (eight) hours as needed for nausea or vomiting.   ondansetron 4 MG tablet Commonly known as:  ZOFRAN Take 1 tablet (4 mg total) by mouth every 8 (eight) hours as needed for nausea or vomiting.   polyethylene glycol powder powder Commonly known as:  GLYCOLAX/MIRALAX Take 17 g by mouth every other day.   PROBIOTIC DAILY PO Take 1 capsule by mouth daily.   ranitidine 150 MG tablet Commonly known as:  ZANTAC Take 150 mg by mouth at bedtime as needed for heartburn.   solifenacin  10 MG tablet Commonly known as:  VESICARE Take 1 tablet (10 mg total) by mouth daily.   TUMS EXTRA STRENGTH 750 750 MG chewable tablet Generic drug:  calcium carbonate Chew 2,250-3,750 tablets by mouth at bedtime as needed for heartburn.   URIBEL 118 MG Caps Take 1 capsule (118 mg total) by mouth QID.            Discharge Care Instructions        Start     Ordered   06/20/17 0000  Urinalysis, Complete     06/20/17 1112      Allergies:  Allergies  Allergen Reactions  . Tetanus-Diphth-Acell Pertussis Swelling  . Sulfa Antibiotics Rash    Family History: Family History  Problem Relation Age of Onset  . Bladder Cancer Brother        x 3   . Brain cancer Father   . Kidney disease Father   . Diabetes Unknown   . Heart attack Mother   . Heart failure Sister   . Bladder Cancer Brother   . Prostate cancer Neg Hx   . Kidney cancer Neg Hx     Social History:  reports that she quit smoking about 35 years ago. She has never used smokeless tobacco. She reports that she drinks alcohol. She reports that she does not use drugs.  ROS: UROLOGY Frequent Urination?: Yes Hard to postpone urination?: Yes Burning/pain with urination?: Yes Get up at night to urinate?: Yes Leakage of urine?: Yes Urine stream starts and stops?: No Trouble starting stream?: No Do you have to strain to urinate?: No Blood in urine?: No Urinary tract infection?: Yes Sexually transmitted disease?: No Injury to kidneys or bladder?: Yes Painful intercourse?: No Weak stream?: No Currently pregnant?: No Vaginal bleeding?: No Last menstrual period?: n  Gastrointestinal Nausea?: No Vomiting?: No Indigestion/heartburn?: Yes Diarrhea?: No Constipation?: Yes  Constitutional Fever: No Night sweats?: No Weight loss?: No Fatigue?: No  Skin Skin rash/lesions?: No Itching?: No  Eyes Blurred vision?: No Double vision?: No  Ears/Nose/Throat Sore throat?: No Sinus problems?:  No  Hematologic/Lymphatic Swollen glands?: No Easy bruising?: Yes  Cardiovascular Leg swelling?: No Chest pain?: No  Respiratory Cough?: Yes Shortness of breath?: No  Endocrine Excessive thirst?: No  Musculoskeletal Back pain?: Yes Joint pain?: No  Neurological Headaches?: No Dizziness?: No  Psychologic Depression?: No Anxiety?: No  Physical Exam: BP (!) 153/79   Pulse 82   Ht 5\' 5"  (1.651 m)   Wt 139 lb (63 kg)   BMI 23.13 kg/m   Constitutional: Well nourished. Alert and oriented, No acute distress. HEENT: [place] AT, moist mucus membranes. Trachea midline, no masses. Cardiovascular:  No clubbing, cyanosis, or edema. Respiratory: Normal respiratory effort, no increased work of breathing. GI: Abdomen is soft, non tender, non distended, no abdominal masses. GU: No CVA tenderness.  No bladder fullness or masses.   Skin: No rashes, bruises or suspicious lesions. Lymph: No cervical or inguinal adenopathy. Neurologic: Grossly intact, no focal deficits, moving all 4 extremities. Psychiatric: Normal mood and affect.  Laboratory Data: Urinalysis Many bacteria, > 30 WBC's. See EPIC.  I have reviewed the labs   Assessment & Plan:    1. Malignant neoplasm of urinary bladder, unspecified site ([place])  - Reviewed BCG treatment course, possible side effects including BCG sepsis, bladder irritation, worsening of her urinary symptoms  - # 3 of 3 maintenance BCG installed today -  Advised to only hold the installation for 2 hours and not longer  - Patient was instructed to pour bleach down her toilet for the next 6 hours  -  Instructed to call the office if she should experience fevers greater than 102, chills/rigors, onset of a new cough, night sweats or further bladder spasms or inability to urinate   - RTC on 08/22/2017 for surveillance cystoscopy  - Urinalysis, Complete  - UA sent for culture - if symptoms worsen will start an antibiotic if appropriate  - bcg vaccine  injection 81 mg; Instill 3.24 mLs (81 mg total) into the bladder once.  - lidocaine (XYLOCAINE) 2 % jelly 1 application; Place 1 application into the urethra once.   2. CIS  - see above  - if patient has reoccurrence, cystectomy is recommended  3. Suprapubic pain  - script given for Vicodin 5/300, one q 6 hours prn for pain, # 5 - checked [employer] web site and no narcotics have been prescribed to her in the last 6 years - advised patient the script is a narcotic and has the potential to be addictive - she is not to drive or make live changing decisions while she is taking this medication      Return for 08/22/2017 for surveillance cystoscopy.  These notes generated with voice recognition software. I apologize for typographical errors.  Zara Council, [facility] [address] [place], [place] [address] [phone]

## 2017-06-20 ENCOUNTER — Ambulatory Visit: Payer: Medicare Other | Admitting: Urology

## 2017-06-20 ENCOUNTER — Encounter: Payer: Self-pay | Admitting: Urology

## 2017-06-20 VITALS — BP 153/79 | HR 82 | Ht 65.0 in | Wt 139.0 lb

## 2017-06-20 DIAGNOSIS — R102 Pelvic and perineal pain: Secondary | ICD-10-CM | POA: Diagnosis not present

## 2017-06-20 DIAGNOSIS — C679 Malignant neoplasm of bladder, unspecified: Secondary | ICD-10-CM | POA: Diagnosis not present

## 2017-06-20 DIAGNOSIS — D090 Carcinoma in situ of bladder: Secondary | ICD-10-CM

## 2017-06-20 LAB — URINALYSIS, COMPLETE
Bilirubin, UA: NEGATIVE
Glucose, UA: NEGATIVE
Ketones, UA: NEGATIVE
Nitrite, UA: NEGATIVE
PH UA: 6.5 (ref 5.0–7.5)
Protein, UA: NEGATIVE
Specific Gravity, UA: <1.005 — ABNORMAL LOW (ref 1.005–1.030)
Urobilinogen, Ur: 0.2 mg/dL (ref 0.2–1.0)

## 2017-06-20 LAB — MICROSCOPIC EXAMINATION: RBC MICROSCOPIC, UA: NONE SEEN /HPF (ref 0–?)

## 2017-06-20 MED ORDER — BCG LIVE 50 MG IS SUSR
3.2400 mL | Freq: Once | INTRAVESICAL | Status: AC
  Administered 2017-06-20: 81 mg via INTRAVESICAL

## 2017-06-20 MED ORDER — HYDROCODONE-ACETAMINOPHEN 5-325 MG PO TABS: 1.0000 | ORAL_TABLET | Freq: Four times a day (QID) | ORAL | 0 refills | Status: DC | PRN

## 2017-06-20 MED ORDER — LIDOCAINE HCL 2 % EX GEL
1.0000 "application " | Freq: Once | CUTANEOUS | Status: AC
  Administered 2017-06-20: 1 via URETHRAL

## 2017-06-20 NOTE — Progress Notes (Signed)
BCG Bladder Instillation  BCG # 3 of 3   Due to Bladder Cancer patient is present today for a BCG treatment. Patient was cleaned and prepped in a sterile fashion with betadine and lidocaine 2% jelly was instilled into the urethra.  A 14R catheter was inserted, urine return was noted 359ml, urine was yellow in color.  71ml of reconstituted BCG was instilled into the bladder. The catheter was then removed. Patient tolerated well, no complications were noted.  Preformed by: Zara Council PA-C and Lyndee Hensen CMA  Follow up/ Additional notes: 2 months for cystoscopy

## 2017-06-21 DIAGNOSIS — Z951 Presence of aortocoronary bypass graft: Secondary | ICD-10-CM

## 2017-06-21 NOTE — Progress Notes (Signed)
Daily Session Note  Patient Details  Name: Adriana Anderson MRN: [id number] Date of Birth: [birth year] Referring Provider:     Cardiac Rehab from 04/16/2017 in [facility] Cardiac and Pulmonary Rehab  Referring Provider  Isaias Cowman MD      Encounter Date: 06/21/2017  Check In:     Session Check In - 06/21/17 0925      Check-In   Location [facility]-Cardiac & Pulmonary Rehab   Staff Present Alberteen Sam, MA, ACSM RCEP, Exercise Physiologist;Amanda Oletta Darter, BA, ACSM CEP, Exercise Physiologist;Meredith Sherryll Burger, RN BSN   Supervising physician immediately available to respond to emergencies See telemetry face sheet for immediately available ER MD   Medication changes reported     No   Fall or balance concerns reported    No   Warm-up and Cool-down Performed on first and last piece of equipment   Resistance Training Performed Yes   VAD Patient? No     Pain Assessment   Currently in Pain? No/denies         History  Smoking Status  . Former Smoker  . Quit date: 04/20/1982  Smokeless Tobacco  . Never Used    Goals Met:  Independence with exercise equipment Exercise tolerated well No report of cardiac concerns or symptoms Strength training completed today  Goals Unmet:  Not Applicable  Comments: Pt able to follow exercise prescription today without complaint.  Will continue to monitor for progression.    Dr. Emily Filbert is Medical Director for [place] and [facility].

## 2017-06-26 DIAGNOSIS — Z951 Presence of aortocoronary bypass graft: Secondary | ICD-10-CM

## 2017-06-26 NOTE — Progress Notes (Signed)
Daily Session Note  Patient Details  Name: PATRICE MATTHEW MRN: [id number] Date of Birth: 03/10/1941 Referring Provider:     Cardiac Rehab from 04/16/2017 in [facility] Cardiac and Pulmonary Rehab  Referring Provider  Isaias Cowman MD      Encounter Date: 06/26/2017  Check In:     Session Check In - 06/26/17 0756      Check-In   Location [facility]-Cardiac & Pulmonary Rehab   Staff Present Alberteen Sam, MA, ACSM RCEP, Exercise Physiologist;Amanda Oletta Darter, BA, ACSM CEP, Exercise Physiologist;Susanne Bice, RN, BSN, CCRP   Supervising physician immediately available to respond to emergencies See telemetry face sheet for immediately available ER MD   Medication changes reported     No   Fall or balance concerns reported    No   Warm-up and Cool-down Performed on first and last piece of equipment   Resistance Training Performed Yes   VAD Patient? No     Pain Assessment   Currently in Pain? No/denies         History  Smoking Status  . Former Smoker  . Quit date: 04/20/1982  Smokeless Tobacco  . Never Used    Goals Met:  Independence with exercise equipment Exercise tolerated well Personal goals reviewed No report of cardiac concerns or symptoms Strength training completed today  Goals Unmet:  Not Applicable  Comments:  Lakyia graduated today from cardiac rehab with 36 sessions completed.  Details of the patient's exercise prescription and what She needs to do in order to continue the prescription and progress were discussed with patient.  Patient was given a copy of prescription and goals.  Patient verbalized understanding.  Kihanna plans to continue to exercise by walking at church.    Dr. Emily Filbert is Medical Director for [place] and [facility].

## 2017-06-26 NOTE — Patient Instructions (Signed)
Discharge Instructions  Patient Details  Name: Adriana Anderson MRN: [id number] Date of Birth: 1941/06/14 Referring Provider:  Madelyn Brunner, MD   Number of Visits: 51  Reason for Discharge:  Patient reached a stable level of exercise. Patient independent in their exercise. Patient has met program and personal goals.  Smoking History:  History  Smoking Status  . Former Smoker  . Quit date: 04/20/1982  Smokeless Tobacco  . Never Used    Diagnosis:  S/P CABG x 3  Initial Exercise Prescription:     Initial Exercise Prescription - 04/16/17 1400      Date of Initial Exercise RX and Referring Provider   Date 04/16/17   Referring Provider Isaias Cowman MD     Treadmill   MPH 1.1   Grade 0   Minutes 15   METs 1.84     NuStep   Level 1   SPM 80   Minutes 15   METs 1.8     Biostep-RELP   Level 1   SPM 50   Minutes 15   METs 2     Prescription Details   Frequency (times per week) 2   Duration Progress to 45 minutes of aerobic exercise without signs/symptoms of physical distress     Intensity   THRR 40-80% of Max Heartrate 96-129   Ratings of Perceived Exertion 11-13   Perceived Dyspnea 0-4     Progression   Progression Continue to progress workloads to maintain intensity without signs/symptoms of physical distress.     Resistance Training   Training Prescription Yes   Weight 2 lbs   Reps 10-15      Discharge Exercise Prescription (Final Exercise Prescription Changes):     Exercise Prescription Changes - 06/19/17 1300      Response to Exercise   Blood Pressure (Admit) 132/84   Blood Pressure (Exercise) 132/64   Blood Pressure (Exit) 138/66   Heart Rate (Admit) 67 bpm   Heart Rate (Exercise) 91 bpm   Heart Rate (Exit) 61 bpm   Rating of Perceived Exertion (Exercise) 12   Symptoms none   Duration Continue with 45 min of aerobic exercise without signs/symptoms of physical distress.   Intensity THRR unchanged     Progression    Progression Continue to progress workloads to maintain intensity without signs/symptoms of physical distress.   Average METs 2.26     Resistance Training   Training Prescription Yes   Weight 3 lbs   Reps 10-15     Interval Training   Interval Training No     Treadmill   MPH 2   Grade 0.5   Minutes 15   METs 2.67     NuStep   Level 3   Minutes 15   METs 2.1     Biostep-RELP   Level 4   Minutes 15   METs 2     Home Exercise Plan   Plans to continue exercise at Home (comment)  walking   Frequency Add 3 additional days to program exercise sessions.   Initial Home Exercises Provided 05/15/17      Functional Capacity:     6 Minute Walk    Row Name 04/16/17 1444 06/19/17 0919       6 Minute Walk   Phase Initial Discharge    Distance 930 feet 1340 feet    Distance % Change  - 44.1 %    Distance Feet Change  - 410 ft    Walk Time  5.8 minutes 6 minutes    # of Rest Breaks 1  12 sec 0    MPH 1.82 2.53    METS 1.82 2.78    RPE 13 12    Perceived Dyspnea  2  -    VO2 Peak 6.39 9.72    Symptoms Yes (comment) No    Comments fatigue and SOB  -    Resting HR 64 bpm 73 bpm    Resting BP 128/78 132/86    Max Ex. HR 72 bpm 91 bpm    Max Ex. BP 146/76 132/64    2 Minute Post BP 132/72  -       Quality of Life:     Quality of Life - 06/19/17 0922      Quality of Life Scores   Health/Function Pre 16.96 %   Health/Function Post 22.63 %   Health/Function % Change 33.43 %   Socioeconomic Pre 27.14 %   Socioeconomic Post 27.81 %   Socioeconomic % Change  2.47 %   Psych/Spiritual Pre 29.29 %   Psych/Spiritual Post 28.93 %   Psych/Spiritual % Change -1.23 %   Family Pre 30 %   Family Post 20.4 %   Family % Change -32 %   GLOBAL Pre 23.71 %   GLOBAL Post 24.76 %   GLOBAL % Change 4.43 %      Personal Goals: Goals established at orientation with interventions provided to work toward goal.     Personal Goals and Risk Factors at Admission - 04/16/17 1208       Core Components/Risk Factors/Patient Goals on Admission    Weight Management Yes;Weight Maintenance   Intervention Weight Management: Develop a combined nutrition and exercise program designed to reach desired caloric intake, while maintaining appropriate intake of nutrient and fiber, sodium and fats, and appropriate energy expenditure required for the weight goal.;Weight Management: Provide education and appropriate resources to help participant work on and attain dietary goals.   Admit Weight 145 lb 6.4 oz (66 kg)   Goal Weight: Short Term 145 lb (65.8 kg)   Goal Weight: Long Term 142 lb (64.4 kg)   Expected Outcomes Long Term: Adherence to nutrition and physical activity/exercise program aimed toward attainment of established weight goal;Weight Maintenance: Understanding of the daily nutrition guidelines, which includes 25-35% calories from fat, 7% or less cal from saturated fats, less than 259m cholesterol, less than 1.5gm of sodium, & 5 or more servings of fruits and vegetables daily;Short Term: Continue to assess and modify interventions until short term weight is achieved   Improve shortness of breath with ADL's Yes  c/o being tired also   Intervention Provide education, individualized exercise plan and daily activity instruction to help decrease symptoms of SOB with activities of daily living.   Expected Outcomes Short Term: Achieves a reduction of symptoms when performing activities of daily living.   Diabetes Yes   Intervention Provide education about signs/symptoms and action to take for hypo/hyperglycemia.;Provide education about proper nutrition, including hydration, and aerobic/resistive exercise prescription along with prescribed medications to achieve blood glucose in normal ranges: Fasting glucose 65-99 mg/dL   Expected Outcomes Short Term: Participant verbalizes understanding of the signs/symptoms and immediate care of hyper/hypoglycemia, proper foot care and importance of  medication, aerobic/resistive exercise and nutrition plan for blood glucose control.;Long Term: Attainment of HbA1C < 7%.       Personal Goals Discharge:     Goals and Risk Factor Review - 05/29/17 1330  Core Components/Risk Factors/Patient Goals Review   Personal Goals Review Weight Management/Obesity;Diabetes;Hypertension   Review Georgenia has lost some weight, but it has slowed as she is starting to be able to eat a littl bit more now.  She has not taken her blood sugar in a while, but has not felt any major swings.  Her meds may be the cause of her nausea and we discussed sitting down with her pharmacist to review her meds.  Her blood pressures have been good.   Expected Outcomes Short: Talk with pharmacist   Long: Continue to work on risk factor modification.      Exercise Goals and Review:     Exercise Goals    Row Name 04/16/17 1453             Exercise Goals   Increase Physical Activity Yes       Intervention Provide advice, education, support and counseling about physical activity/exercise needs.;Develop an individualized exercise prescription for aerobic and resistive training based on initial evaluation findings, risk stratification, comorbidities and participant's personal goals.       Expected Outcomes Achievement of increased cardiorespiratory fitness and enhanced flexibility, muscular endurance and strength shown through measurements of functional capacity and personal statement of participant.       Increase Strength and Stamina Yes       Intervention Provide advice, education, support and counseling about physical activity/exercise needs.;Develop an individualized exercise prescription for aerobic and resistive training based on initial evaluation findings, risk stratification, comorbidities and participant's personal goals.       Expected Outcomes Achievement of increased cardiorespiratory fitness and enhanced flexibility, muscular endurance and strength shown  through measurements of functional capacity and personal statement of participant.          Nutrition & Weight - Outcomes:     Pre Biometrics - 04/16/17 1453      Pre Biometrics   Height 5' 4.7" (1.643 m)   Weight 145 lb 6.4 oz (66 kg)   Waist Circumference 34.5 inches   Hip Circumference 38 inches   Waist to Hip Ratio 0.91 %   BMI (Calculated) 24.5   Single Leg Stand 5.83 seconds         Post Biometrics - 06/19/17 0920       Post  Biometrics   Height 5' 4.7" (1.643 m)   Weight 141 lb (64 kg)   Waist Circumference 32.5 inches   Hip Circumference 39 inches   Waist to Hip Ratio 0.83 %   BMI (Calculated) 23.69   Single Leg Stand 15.54 seconds      Nutrition:     Nutrition Therapy & Goals - 04/16/17 1208      Nutrition Therapy   Drug/Food Interactions Statins/Certain Fruits     Intervention Plan   Expected Outcomes Short Term Goal: Understand basic principles of dietary content, such as calories, fat, sodium, cholesterol and nutrients.      Nutrition Discharge:     Nutrition Assessments - 06/19/17 0921      MEDFICTS Scores   Pre Score 31   Post Score 59   Score Difference 28      Education Questionnaire Score:     Knowledge Questionnaire Score - 06/19/17 0921      Knowledge Questionnaire Score   Pre Score 26/28   Post Score 23/28      Goals reviewed with patient; copy given to patient.

## 2017-06-26 NOTE — Progress Notes (Signed)
Discharge Progress Report  Patient Details  Name: Adriana Anderson MRN: [id number] Date of Birth: 26-Apr-1941 Referring Provider:     Cardiac Rehab from 04/16/2017 in [facility] Cardiac and Pulmonary Rehab  Referring Provider  Isaias Cowman MD       Number of Visits: 36/36  Reason for Discharge:  Patient reached a stable level of exercise. Patient independent in their exercise. Patient has met program and personal goals.  Smoking History:  History  Smoking Status  . Former Smoker  . Quit date: 04/20/1982  Smokeless Tobacco  . Never Used    Diagnosis:  S/P CABG x 3  ADL UCSD:   Initial Exercise Prescription:     Initial Exercise Prescription - 04/16/17 1400      Date of Initial Exercise RX and Referring Provider   Date 04/16/17   Referring Provider Isaias Cowman MD     Treadmill   MPH 1.1   Grade 0   Minutes 15   METs 1.84     NuStep   Level 1   SPM 80   Minutes 15   METs 1.8     Biostep-RELP   Level 1   SPM 50   Minutes 15   METs 2     Prescription Details   Frequency (times per week) 2   Duration Progress to 45 minutes of aerobic exercise without signs/symptoms of physical distress     Intensity   THRR 40-80% of Max Heartrate 96-129   Ratings of Perceived Exertion 11-13   Perceived Dyspnea 0-4     Progression   Progression Continue to progress workloads to maintain intensity without signs/symptoms of physical distress.     Resistance Training   Training Prescription Yes   Weight 2 lbs   Reps 10-15      Discharge Exercise Prescription (Final Exercise Prescription Changes):     Exercise Prescription Changes - 06/19/17 1300      Response to Exercise   Blood Pressure (Admit) 132/84   Blood Pressure (Exercise) 132/64   Blood Pressure (Exit) 138/66   Heart Rate (Admit) 67 bpm   Heart Rate (Exercise) 91 bpm   Heart Rate (Exit) 61 bpm   Rating of Perceived Exertion (Exercise) 12   Symptoms none   Duration Continue with 45 min of  aerobic exercise without signs/symptoms of physical distress.   Intensity THRR unchanged     Progression   Progression Continue to progress workloads to maintain intensity without signs/symptoms of physical distress.   Average METs 2.26     Resistance Training   Training Prescription Yes   Weight 3 lbs   Reps 10-15     Interval Training   Interval Training No     Treadmill   MPH 2   Grade 0.5   Minutes 15   METs 2.67     NuStep   Level 3   Minutes 15   METs 2.1     Biostep-RELP   Level 4   Minutes 15   METs 2     Home Exercise Plan   Plans to continue exercise at Home (comment)  walking   Frequency Add 3 additional days to program exercise sessions.   Initial Home Exercises Provided 05/15/17      Functional Capacity:     6 Minute Walk    Row Name 04/16/17 1444 06/19/17 0919       6 Minute Walk   Phase Initial Discharge    Distance 930 feet 1340 feet  Distance % Change  - 44.1 %    Distance Feet Change  - 410 ft    Walk Time 5.8 minutes 6 minutes    # of Rest Breaks 1  12 sec 0    MPH 1.82 2.53    METS 1.82 2.78    RPE 13 12    Perceived Dyspnea  2  -    VO2 Peak 6.39 9.72    Symptoms Yes (comment) No    Comments fatigue and SOB  -    Resting HR 64 bpm 73 bpm    Resting BP 128/78 132/86    Max Ex. HR 72 bpm 91 bpm    Max Ex. BP 146/76 132/64    2 Minute Post BP 132/72  -       Psychological, QOL, Others - Outcomes: PHQ 2/9: Depression screen [facility] 2/9 06/19/2017 04/16/2017  Decreased Interest 1 0  Down, Depressed, Hopeless 0 0  PHQ - 2 Score 1 0  Altered sleeping 0 0  Tired, decreased energy 1 3  Change in appetite 1 2  Feeling bad or failure about yourself  0 0  Trouble concentrating 0 0  Moving slowly or fidgety/restless 0 0  Suicidal thoughts 0 0  PHQ-9 Score 3 5  Difficult doing work/chores Not difficult at all Somewhat difficult    Quality of Life:     Quality of Life - 06/19/17 0922      Quality of Life Scores    Health/Function Pre 16.96 %   Health/Function Post 22.63 %   Health/Function % Change 33.43 %   Socioeconomic Pre 27.14 %   Socioeconomic Post 27.81 %   Socioeconomic % Change  2.47 %   Psych/Spiritual Pre 29.29 %   Psych/Spiritual Post 28.93 %   Psych/Spiritual % Change -1.23 %   Family Pre 30 %   Family Post 20.4 %   Family % Change -32 %   GLOBAL Pre 23.71 %   GLOBAL Post 24.76 %   GLOBAL % Change 4.43 %      Personal Goals: Goals established at orientation with interventions provided to work toward goal.     Personal Goals and Risk Factors at Admission - 04/16/17 1208      Core Components/Risk Factors/Patient Goals on Admission    Weight Management Yes;Weight Maintenance   Intervention Weight Management: Develop a combined nutrition and exercise program designed to reach desired caloric intake, while maintaining appropriate intake of nutrient and fiber, sodium and fats, and appropriate energy expenditure required for the weight goal.;Weight Management: Provide education and appropriate resources to help participant work on and attain dietary goals.   Admit Weight 145 lb 6.4 oz (66 kg)   Goal Weight: Short Term 145 lb (65.8 kg)   Goal Weight: Long Term 142 lb (64.4 kg)   Expected Outcomes Long Term: Adherence to nutrition and physical activity/exercise program aimed toward attainment of established weight goal;Weight Maintenance: Understanding of the daily nutrition guidelines, which includes 25-35% calories from fat, 7% or less cal from saturated fats, less than 273m cholesterol, less than 1.5gm of sodium, & 5 or more servings of fruits and vegetables daily;Short Term: Continue to assess and modify interventions until short term weight is achieved   Improve shortness of breath with ADL's Yes  c/o being tired also   Intervention Provide education, individualized exercise plan and daily activity instruction to help decrease symptoms of SOB with activities of daily living.    Expected Outcomes Short Term: Achieves  a reduction of symptoms when performing activities of daily living.   Diabetes Yes   Intervention Provide education about signs/symptoms and action to take for hypo/hyperglycemia.;Provide education about proper nutrition, including hydration, and aerobic/resistive exercise prescription along with prescribed medications to achieve blood glucose in normal ranges: Fasting glucose 65-99 mg/dL   Expected Outcomes Short Term: Participant verbalizes understanding of the signs/symptoms and immediate care of hyper/hypoglycemia, proper foot care and importance of medication, aerobic/resistive exercise and nutrition plan for blood glucose control.;Long Term: Attainment of HbA1C < 7%.       Personal Goals Discharge:     Goals and Risk Factor Review    Row Name 05/01/17 0936 05/29/17 1330           Core Components/Risk Factors/Patient Goals Review   Personal Goals Review Weight Management/Obesity Weight Management/Obesity;Diabetes;Hypertension      Review wt 152 today even though Ranelle reports that she is not eating well and will go to a Cardiac REhab [facility] Dietic[facility] appt. Kyeshia said since her CABG her neck hurts when she reaching for something in her cabinet. Nada Maclachlan, EP suggested some neck stretches and reminded her when reading to keep good posture.  Piper has lost some weight, but it has slowed as she is starting to be able to eat a littl bit more now.  She has not taken her blood sugar in a while, but has not felt any major swings.  Her meds may be the cause of her nausea and we discussed sitting down with her pharmacist to review her meds.  Her blood pressures have been good.      Expected Outcomes Heart healthy living  Short: Talk with pharmacist   Long: Continue to work on risk factor modification.         Exercise Goals and Review:     Exercise Goals    Row Name 04/16/17 1453             Exercise Goals   Increase Physical Activity Yes        Intervention Provide advice, education, support and counseling about physical activity/exercise needs.;Develop an individualized exercise prescription for aerobic and resistive training based on initial evaluation findings, risk stratification, comorbidities and participant's personal goals.       Expected Outcomes Achievement of increased cardiorespiratory fitness and enhanced flexibility, muscular endurance and strength shown through measurements of functional capacity and personal statement of participant.       Increase Strength and Stamina Yes       Intervention Provide advice, education, support and counseling about physical activity/exercise needs.;Develop an individualized exercise prescription for aerobic and resistive training based on initial evaluation findings, risk stratification, comorbidities and participant's personal goals.       Expected Outcomes Achievement of increased cardiorespiratory fitness and enhanced flexibility, muscular endurance and strength shown through measurements of functional capacity and personal statement of participant.          Nutrition & Weight - Outcomes:     Pre Biometrics - 04/16/17 1453      Pre Biometrics   Height 5' 4.7" (1.643 m)   Weight 145 lb 6.4 oz (66 kg)   Waist Circumference 34.5 inches   Hip Circumference 38 inches   Waist to Hip Ratio 0.91 %   BMI (Calculated) 24.5   Single Leg Stand 5.83 seconds         Post Biometrics - 06/19/17 0920       Post  Biometrics   Height 5' 4.7" (1.643  m)   Weight 141 lb (64 kg)   Waist Circumference 32.5 inches   Hip Circumference 39 inches   Waist to Hip Ratio 0.83 %   BMI (Calculated) 23.69   Single Leg Stand 15.54 seconds      Nutrition:     Nutrition Therapy & Goals - 04/16/17 1208      Nutrition Therapy   Drug/Food Interactions Statins/Certain Fruits     Intervention Plan   Expected Outcomes Short Term Goal: Understand basic principles of dietary content, such as calories,  fat, sodium, cholesterol and nutrients.      Nutrition Discharge:     Nutrition Assessments - 06/19/17 0921      MEDFICTS Scores   Pre Score 31   Post Score 59   Score Difference 28      Education Questionnaire Score:     Knowledge Questionnaire Score - 06/19/17 0921      Knowledge Questionnaire Score   Pre Score 26/28   Post Score 23/28      Goals reviewed with patient; copy given to patient.

## 2017-06-26 NOTE — Progress Notes (Signed)
Cardiac Individual Treatment Plan  Patient Details  Name: Adriana Anderson MRN: [id number] Date of Birth: 1941-09-19 Referring Provider:     Cardiac Rehab from 04/16/2017 in [facility] Cardiac and Pulmonary Rehab  Referring Provider  Isaias Cowman MD      Initial Encounter Date:    Cardiac Rehab from 04/16/2017 in [facility] Cardiac and Pulmonary Rehab  Date  04/16/17  Referring Provider  Isaias Cowman MD      Visit Diagnosis: S/P CABG x 3  Patient's Home Medications on Admission:  Current Outpatient Prescriptions:  .  amoxicillin-clavulanate (AUGMENTIN) 875-125 MG tablet, Take 1 tablet by mouth every 12 (twelve) hours., Disp: 14 tablet, Rfl: 0 .  apixaban (ELIQUIS) 5 MG TABS tablet, Take by mouth., Disp: , Rfl:  .  aspirin EC 81 MG tablet, Take 81 mg by mouth at bedtime., Disp: , Rfl:  .  atorvastatin (LIPITOR) 40 MG tablet, Take by mouth., Disp: , Rfl:  .  calcium carbonate (TUMS EXTRA STRENGTH 750) 750 MG chewable tablet, Chew 2,250-3,750 tablets by mouth at bedtime as needed for heartburn., Disp: , Rfl:  .  Calcium Carbonate-Vitamin D (CALTRATE 600+D PO), Take 1 tablet by mouth 2 (two) times daily., Disp: , Rfl:  .  estradiol (ESTRACE VAGINAL) 0.1 MG/GM vaginal cream, USE 1 APPLICATION VAGINALLY ONCE A WEEK, Disp: 42.5 g, Rfl: 3 .  HYDROcodone-acetaminophen (NORCO/VICODIN) 5-325 MG tablet, Take 1 tablet by mouth every 6 (six) hours as needed for moderate pain., Disp: 5 tablet, Rfl: 0 .  loratadine (CLARITIN) 10 MG tablet, Take 10 mg by mouth daily as needed for allergies., Disp: , Rfl:  .  Meth-Hyo-M Bl-Na Phos-Ph Sal (URIBEL) 118 MG CAPS, Take 1 capsule (118 mg total) by mouth QID., Disp: 40 capsule, Rfl: 3 .  metoprolol tartrate (LOPRESSOR) 25 MG tablet, Take by mouth., Disp: , Rfl:  .  omeprazole-sodium bicarbonate (ZEGERID) 40-1100 MG capsule, Take 1 capsule by mouth 2 (two) times daily. 20 instesd of 40, Disp: , Rfl:  .  ondansetron (ZOFRAN ODT) 4 MG disintegrating tablet,  Take 1 tablet (4 mg total) by mouth every 8 (eight) hours as needed for nausea or vomiting., Disp: 20 tablet, Rfl: 0 .  ondansetron (ZOFRAN) 4 MG tablet, Take 1 tablet (4 mg total) by mouth every 8 (eight) hours as needed for nausea or vomiting., Disp: 20 tablet, Rfl: 0 .  polyethylene glycol powder (GLYCOLAX/MIRALAX) powder, Take 17 g by mouth every other day. , Disp: , Rfl:  .  Probiotic Product (PROBIOTIC DAILY PO), Take 1 capsule by mouth daily., Disp: , Rfl:  .  ranitidine (ZANTAC) 150 MG tablet, Take 150 mg by mouth at bedtime as needed for heartburn., Disp: , Rfl:  .  solifenacin (VESICARE) 10 MG tablet, Take 1 tablet (10 mg total) by mouth daily., Disp: 90 tablet, Rfl: 0  Past Medical History: Past Medical History:  Diagnosis Date  . Acid reflux   . Bladder cancer ([place])   . Bladder mass   . Diabetes mellitus, type 2 ([place])   . GERD (gastroesophageal reflux disease)   . History of biopsy of bladder   . History of cystoscopy   . Liver disease   . PONV (postoperative nausea and vomiting)     Tobacco Use: History  Smoking Status  . Former Smoker  . Quit date: 04/20/1982  Smokeless Tobacco  . Never Used    Labs: Recent Review Flowsheet Data    There is no flowsheet data to display.  Exercise Target Goals:    Exercise Program Goal: Individual exercise prescription set with THRR, safety & activity barriers. Participant demonstrates ability to understand and report RPE using BORG scale, to self-measure pulse accurately, and to acknowledge the importance of the exercise prescription.  Exercise Prescription Goal: Starting with aerobic activity 30 plus minutes a day, 3 days per week for initial exercise prescription. Provide home exercise prescription and guidelines that participant acknowledges understanding prior to discharge.  Activity Barriers & Risk Stratification:     Activity Barriers & Cardiac Risk Stratification - 04/16/17 1048      Activity Barriers &  Cardiac Risk Stratification   Activity Barriers Back Problems;Deconditioning;Muscular Weakness;Shortness of Breath;Balance Concerns;Neck/Spine Problems  low back pain, when she reaches up she has neck pain bilaterally   Cardiac Risk Stratification High      6 Minute Walk:     6 Minute Walk    Row Name 04/16/17 1444 06/19/17 0919       6 Minute Walk   Phase Initial Discharge    Distance 930 feet 1340 feet    Distance % Change  - 44.1 %    Distance Feet Change  - 410 ft    Walk Time 5.8 minutes 6 minutes    # of Rest Breaks 1  12 sec 0    MPH 1.82 2.53    METS 1.82 2.78    RPE 13 12    Perceived Dyspnea  2  -    VO2 Peak 6.39 9.72    Symptoms Yes (comment) No    Comments fatigue and SOB  -    Resting HR 64 bpm 73 bpm    Resting BP 128/78 132/86    Max Ex. HR 72 bpm 91 bpm    Max Ex. BP 146/76 132/64    2 Minute Post BP 132/72  -       Oxygen Initial Assessment:     Oxygen Initial Assessment - 04/16/17 1208      Home Oxygen   Home Oxygen Device None     Initial 6 min Walk   Oxygen Used None      Oxygen Re-Evaluation:   Oxygen Discharge (Final Oxygen Re-Evaluation):   Initial Exercise Prescription:     Initial Exercise Prescription - 04/16/17 1400      Date of Initial Exercise RX and Referring Provider   Date 04/16/17   Referring Provider Paraschos, Alexander MD     Treadmill   MPH 1.1   Grade 0   Minutes 15   METs 1.84     NuStep   Level 1   SPM 80   Minutes 15   METs 1.8     Biostep-RELP   Level 1   SPM 50   Minutes 15   METs 2     Prescription Details   Frequency (times per week) 2   Duration Progress to 45 minutes of aerobic exercise without signs/symptoms of physical distress     Intensity   THRR 40-80% of Max Heartrate 96-129   Ratings of Perceived Exertion 11-13   Perceived Dyspnea 0-4     Progression   Progression Continue to progress workloads to maintain intensity without signs/symptoms of physical distress.      Resistance Training   Training Prescription Yes   Weight 2 lbs   Reps 10-15      Perform Capillary Blood Glucose checks as needed.  Exercise Prescription Changes:      Exercise Prescription Changes  Row Name 04/16/17 1400 04/25/17 1400 05/10/17 1500 05/15/17 0900 05/23/17 1500     Response to Exercise   Blood Pressure (Admit) 128/78 126/70 130/66  - 128/68   Blood Pressure (Exercise) 146/76 120/70 130/66  - 134/76   Blood Pressure (Exit) 132/72 124/58 122/66  - 126/72   Heart Rate (Admit) 64 bpm 68 bpm 65 bpm  - 66 bpm   Heart Rate (Exercise) 72 bpm 92 bpm 96 bpm  - 80 bpm   Heart Rate (Exit) 59 bpm 57 bpm 62 bpm  - 63 bpm   Oxygen Saturation (Admit) 98 %  -  -  -  -   Oxygen Saturation (Exercise) 100 %  -  -  -  -   Rating of Perceived Exertion (Exercise) 13 13 14   - 11   Perceived Dyspnea (Exercise) 2 2  -  -  -   Symptoms fatigue and SOB none none  - none   Comments walk test results  -  -  -  -   Duration  - Progress to 45 minutes of aerobic exercise without signs/symptoms of physical distress Continue with 45 min of aerobic exercise without signs/symptoms of physical distress.  - Continue with 45 min of aerobic exercise without signs/symptoms of physical distress.   Intensity  - THRR unchanged THRR unchanged  - THRR unchanged     Progression   Progression  - Continue to progress workloads to maintain intensity without signs/symptoms of physical distress. Continue to progress workloads to maintain intensity without signs/symptoms of physical distress.  - Continue to progress workloads to maintain intensity without signs/symptoms of physical distress.   Average METs  - 2.08 2.09  - 2.25     Resistance Training   Training Prescription  - Yes Yes  - Yes   Weight  - 2 lbs 2 lbs  - 3 lbs   Reps  - 10-15 10-15  - 10-15     Interval Training   Interval Training  - No No  - No     Treadmill   MPH  - 1.5 2  - 2   Grade  - 0.5 0.5  - 0.5   Minutes  - 15 15  - 15   METs  -  2.25 2.67  - 2.67     NuStep   Level  - 1 1  - 3   Minutes  - 15 15  - 15   METs  - 2 1.9  - 2.1     Biostep-RELP   Level  - 1 1  - 1   Minutes  - 15 15  - 15   METs  - 2 2  - 2     Home Exercise Plan   Plans to continue exercise at  -  -  - Home (comment)  walking Home (comment)  walking   Frequency  -  -  - Add 3 additional days to program exercise sessions. Add 3 additional days to program exercise sessions.   Initial Home Exercises Provided  -  -  - 05/15/17 05/15/17   Row Name 06/05/17 1600 06/19/17 1300           Response to Exercise   Blood Pressure (Admit) 120/58 132/84      Blood Pressure (Exercise) 126/64 132/64      Blood Pressure (Exit) 128/60 138/66      Heart Rate (Admit) 78 bpm 67 bpm      Heart Rate (  Exercise) 91 bpm 91 bpm      Heart Rate (Exit) 67 bpm 61 bpm      Rating of Perceived Exertion (Exercise) 11 12      Symptoms none none      Duration Continue with 45 min of aerobic exercise without signs/symptoms of physical distress. Continue with 45 min of aerobic exercise without signs/symptoms of physical distress.      Intensity THRR unchanged THRR unchanged        Progression   Progression Continue to progress workloads to maintain intensity without signs/symptoms of physical distress. Continue to progress workloads to maintain intensity without signs/symptoms of physical distress.      Average METs 2.19 2.26        Resistance Training   Training Prescription Yes Yes      Weight 3 lbs 3 lbs      Reps 10-15 10-15        Interval Training   Interval Training No No        Treadmill   MPH 2 2      Grade 0.5 0.5      Minutes 15 15      METs 2.67 2.67        NuStep   Level 3 3      Minutes 15 15      METs 2 2.1        Biostep-RELP   Level 1 4      Minutes 15 15      METs 2 2        Home Exercise Plan   Plans to continue exercise at Home (comment)  walking Home (comment)  walking      Frequency Add 3 additional days to program exercise  sessions. Add 3 additional days to program exercise sessions.      Initial Home Exercises Provided 05/15/17 05/15/17         Exercise Comments:      Exercise Comments    Row Name 04/17/17 0951 06/26/17 1025         Exercise Comments First full day of exercise!  Patient was oriented to gym and equipment including functions, settings, policies, and procedures.  Patient's individual exercise prescription and treatment plan were reviewed.  All starting workloads were established based on the results of the 6 minute walk test done at initial orientation visit.  The plan for exercise progression was also introduced and progression will be customized based on patient's performance and goals. Christinia graduated today from cardiac rehab with 36 sessions completed.  Details of the patient's exercise prescription and what She needs to do in order to continue the prescription and progress were discussed with patient.  Patient was given a copy of prescription and goals.  Patient verbalized understanding.  Raegyn plans to continue to exercise by walking at church.         Exercise Goals and Review:      Exercise Goals    Row Name 04/16/17 1453             Exercise Goals   Increase Physical Activity Yes       Intervention Provide advice, education, support and counseling about physical activity/exercise needs.;Develop an individualized exercise prescription for aerobic and resistive training based on initial evaluation findings, risk stratification, comorbidities and participant's personal goals.       Expected Outcomes Achievement of increased cardiorespiratory fitness and enhanced flexibility, muscular endurance and strength shown through measurements of functional capacity and  personal statement of participant.       Increase Strength and Stamina Yes       Intervention Provide advice, education, support and counseling about physical activity/exercise needs.;Develop an individualized exercise  prescription for aerobic and resistive training based on initial evaluation findings, risk stratification, comorbidities and participant's personal goals.       Expected Outcomes Achievement of increased cardiorespiratory fitness and enhanced flexibility, muscular endurance and strength shown through measurements of functional capacity and personal statement of participant.          Exercise Goals Re-Evaluation :     Exercise Goals Re-Evaluation    Row Name 04/25/17 1441 05/10/17 1542 05/15/17 0946 05/23/17 1549 05/29/17 1316     Exercise Goal Re-Evaluation   Exercise Goals Review Increase Strenth and Stamina;Increase Physical Activity Increase Strenth and Stamina;Increase Physical Activity Increase Strenth and Stamina;Increase Physical Activity Increase Strenth and Stamina;Increase Physical Activity Increase Physical Activity;Increase Strength and Stamina;Understanding of Exercise Prescription   Comments Lauralye is off to a good start in rehab.  She has completed 3 full days of exercise.  She is eager to get moving and has already noticed things getting easier.  We will continue to monitor her progression. Jimena continues to do well in rehab.  She felt a little more nauseated than normal today.  She did back off some of her workloads todays.  We will continue to monitor her progress.  Reviewed home exercise with pt today.  Pt plans to walk at home for exercise.  Reviewed THR, pulse, RPE, sign and symptoms, NTG use, and when to call 911 or MD.  Also discussed weather considerations and indoor options.  Pt voiced understanding. Hanley Ben has been doing well in rehab.  She has started to add in walking at home.  She is now up to level 3 on the NuStep.  We will continue to monitor her progression.  Kaily continues to do fairly well in rehab.  She is not doing much activity at home as any time she moves she starts to feel nausea.  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  She know that exercise is good for her, but she is not able to move around much.  She is able to do the machines with less nausea.     Expected Outcomes Short: Move up workloads on all pieces.  Long: Continue to exercise on her own at home. Short: Move up workloads.  Long: Continue to walk more at home.  Short: Add in home exercise.  Long: Walk more regularly. Short: Continue work on increasing work loads.  Long: Exercise independently at home.  Short: Continue to try to exercise more.  Long: Continue to increase strength and stamina.    [place] Name 06/05/17 1620 06/19/17 1353           Exercise Goal Re-Evaluation   Exercise Goals Review Increase Physical Activity;Increase Strength and Stamina Increase Physical Activity;Increase Strength and Stamina      Comments Simranjit has been doing well in rehab.   She continues to have nausea with movement.  We will try to start to increase some of her workloads more on the seated machines.  Continue to monitor her progression.  Kamla is starting to feel better and more like herself.  She improved her walk test by 410 ft!!  She is so happy to feel better and feel like having more energy.  Expected Outcomes Short: Try to increase some workloads.  Long: Try to exercise more at home.  Short: Graduate by the end of the month.  Long: Continue to exercise at home (walking at church)         Discharge Exercise Prescription (Final Exercise Prescription Changes):     Exercise Prescription Changes - 06/19/17 1300      Response to Exercise   Blood Pressure (Admit) 132/84   Blood Pressure (Exercise) 132/64   Blood Pressure (Exit) 138/66   Heart Rate (Admit) 67 bpm   Heart Rate (Exercise) 91 bpm   Heart Rate (Exit) 61 bpm   Rating of Perceived Exertion (Exercise) 12   Symptoms none   Duration Continue with 45 min of aerobic exercise without signs/symptoms of physical distress.   Intensity THRR unchanged     Progression   Progression  Continue to progress workloads to maintain intensity without signs/symptoms of physical distress.   Average METs 2.26     Resistance Training   Training Prescription Yes   Weight 3 lbs   Reps 10-15     Interval Training   Interval Training No     Treadmill   MPH 2   Grade 0.5   Minutes 15   METs 2.67     NuStep   Level 3   Minutes 15   METs 2.1     Biostep-RELP   Level 4   Minutes 15   METs 2     Home Exercise Plan   Plans to continue exercise at Home (comment)  walking   Frequency Add 3 additional days to program exercise sessions.   Initial Home Exercises Provided 05/15/17      Nutrition:  Target Goals: Understanding of nutrition guidelines, daily intake of sodium <1533m, cholesterol <2051m calories 30% from fat and 7% or less from saturated fats, daily to have 5 or more servings of fruits and vegetables.  Biometrics:     Pre Biometrics - 04/16/17 1453      Pre Biometrics   Height 5' 4.7" (1.643 m)   Weight 145 lb 6.4 oz (66 kg)   Waist Circumference 34.5 inches   Hip Circumference 38 inches   Waist to Hip Ratio 0.91 %   BMI (Calculated) 24.5   Single Leg Stand 5.83 seconds         Post Biometrics - 06/19/17 0920       Post  Biometrics   Height 5' 4.7" (1.643 m)   Weight 141 lb (64 kg)   Waist Circumference 32.5 inches   Hip Circumference 39 inches   Waist to Hip Ratio 0.83 %   BMI (Calculated) 23.69   Single Leg Stand 15.54 seconds      Nutrition Therapy Plan and Nutrition Goals:     Nutrition Therapy & Goals - 04/16/17 1208      Nutrition Therapy   Drug/Food Interactions Statins/Certain Fruits     Intervention Plan   Expected Outcomes Short Term Goal: Understand basic principles of dietary content, such as calories, fat, sodium, cholesterol and nutrients.      Nutrition Discharge: Rate Your Plate Scores:     Nutrition Assessments - 06/19/17 0921      MEDFICTS Scores   Pre Score 31   Post Score 59   Score Difference 28       Nutrition Goals Re-Evaluation:     Nutrition Goals Re-Evaluation    Row Name 05/29/17 1342  Goals   Nutrition Goal Use written tips provided to help with controlling nausea, eat every 2-4 hrs, try protein drink, fruits and vegetables       Comment Cira has not tried the tips for nausea.  She is taking zofram now 1-2 times a day.  She is eating some more now and able to tolerate cottage cheese.  She is also able to eat some fruits as well.  She drinks miralax daily with fruit juice.  She continues to have a poor appetite but forces herself to eat.   She has also not tried the protein drink as the one she tried in the past was too sweet.  We talked about trying all the different kinds on the market.       Expected Outcome Short: Try a protein drink.  Long: Try to combat her nausea and improve her appeptite.           Nutrition Goals Discharge (Final Nutrition Goals Re-Evaluation):     Nutrition Goals Re-Evaluation - 05/29/17 1342      Goals   Nutrition Goal Use written tips provided to help with controlling nausea, eat every 2-4 hrs, try protein drink, fruits and vegetables   Comment Keilany has not tried the tips for nausea.  She is taking zofram now 1-2 times a day.  She is eating some more now and able to tolerate cottage cheese.  She is also able to eat some fruits as well.  She drinks miralax daily with fruit juice.  She continues to have a poor appetite but forces herself to eat.   She has also not tried the protein drink as the one she tried in the past was too sweet.  We talked about trying all the different kinds on the market.   Expected Outcome Short: Try a protein drink.  Long: Try to combat her nausea and improve her appeptite.       Psychosocial: Target Goals: Acknowledge presence or absence of significant depression and/or stress, maximize coping skills, provide positive support system. Participant is able to verbalize types and ability to use techniques and  skills needed for reducing stress and depression.   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       Quality of Life Scores:      Quality of Life - 06/19/17 0922      Quality of Life Scores   Health/Function Pre 16.96 %   Health/Function Post 22.63 %   Health/Function % Change 33.43 %   Socioeconomic Pre 27.14 %   Socioeconomic Post 27.81 %   Socioeconomic % Change  2.47 %   Psych/Spiritual Pre 29.29 %   Psych/Spiritual Post 28.93 %   Psych/Spiritual % Change -1.23 %   Family Pre 30 %   Family Post 20.4 %   Family % Change -32 %   GLOBAL Pre 23.71 %   GLOBAL Post 24.76 %   GLOBAL % Change 4.43 %      PHQ-9: Recent Review Flowsheet Data    Depression screen [facility] 2/9 06/19/2017 04/16/2017   Decreased Interest 1 0   Down, Depressed, Hopeless 0 0   PHQ - 2 Score 1 0   Altered sleeping 0 0   Tired, decreased energy 1 3   Change in appetite  1 2   Feeling bad or failure about yourself  0 0   Trouble concentrating 0 0   Moving slowly or fidgety/restless 0 0   Suicidal thoughts 0 0   PHQ-9 Score 3 5   Difficult doing work/chores Not difficult at all Somewhat difficult     Interpretation of Total Score  Total Score Depression Severity:  1-4 = Minimal depression, 5-9 = Mild depression, 10-14 = Moderate depression, 15-19 = Moderately severe depression, 20-27 = Severe depression   Psychosocial Evaluation and Intervention:     Psychosocial Evaluation - 05/03/17 0925      Psychosocial Evaluation & Interventions   Interventions Encouraged to exercise with the program and follow exercise prescription;Relaxation education;Stress management education   Comments Counselor met with Ms. Montesano Bethena Roys) today for initial psychosocial evaluation.  She is a 76 year old who had a CABGx3 on June 6th.  Mistee has a strong support  system with a spouse of 73 years; (2) adult daughters who live close by and a daughter in [place] who is a Marine scientist checks in on [place] frequently.  Rocklyn was diagnosed with bladder cancer 2 years ago and is currently cancer free.  She sleeps well and reports her appetite has been "terrible" since the surgery with increased nausea.  She is taking something for that every evening and states it helps somewhat.  Kadi denies a history of depression or anxiety or any current symptoms.  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  Counselor encouraged Delancey to "delegate" better with family members for some of these tasks until Adithi is able to do them herself.  She has goals to get her strength back while in this program.  Staff will follow with Bethena Roys.   Expected Outcomes Lalita will benefit from consistent exercise to achieve her stated goals.  The educational and psychoeducational components of this class will be helpful in understanding and managing her diagnoses better.  Nyeshia will benefit especially from the stress management education to learn improved self care in order to be more assertive in accepting her limitations and asking for help.        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  They have delegated out the yard care to a neighbor boy.  However, both her and her husband continue to sit around at home not doing too much with his health and her nausea.  They nausea is very frustrating.  She is about ready to give up as she doesn't want to keeping  living with nausea.  We talked about sitting down with her pharmacist to look at her meds to help.  She has noticed that exercise has improved her strength and stamina some, but not enough as she is still not able to go out shopping with her friend after they share a meal.      Expected Outcomes  - Short: Talk to pharamacist about nausea.  Long: Build  strength and stamina to be able to work on farm.      Interventions Encouraged to attend Cardiac Rehabilitation for the exercise Encouraged to attend Cardiac Rehabilitation for the exercise;Stress management education      Continue Psychosocial Services   - Follow up required by staff      Comments Soo wants to work in her yard and hopes to now when rain stops since she said since working out in Cardiac Rehab that she should be able to work out in her yard also plus the farm.  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  They have delegated out the yard care to a neighbor boy.  However, both her and her husband continue to sit around at home not doing too much with his health and her nausea.  They nausea is very frustrating.  She is about ready to give up as she doesn't want to keeping living with nausea.  We talked about sitting down with her pharmacist to look at her meds to help.  She has noticed that exercise has improved her strength and stamina some, but not enough as she is still not able to go out shopping with her friend after they share a meal.   Expected Outcomes Short: Talk to pharamacist about nausea.  Long: Build strength and stamina to be able to work on  farm.   Interventions Encouraged to attend Cardiac Rehabilitation for the exercise;Stress management education   Continue Psychosocial Services  Follow up required by staff     Initial Review   Source of Stress Concerns Chronic Illness;Unable to perform yard/household activities      Vocational Rehabilitation: Provide vocational rehab assistance to qualifying candidates.   Vocational Rehab Evaluation & Intervention:     Vocational Rehab - 04/16/17 1047      Initial Vocational Rehab Evaluation & Intervention   Assessment shows need for Vocational Rehabilitation No      Education: Education Goals: Education classes will be provided on a variety of topics geared toward better understanding of heart health and risk factor modification. Participant will state understanding/return demonstration of topics presented as noted by education test scores.  Learning Barriers/Preferences:     Learning Barriers/Preferences - 04/16/17 1048      Learning Barriers/Preferences   Learning Barriers Hearing   Learning Preferences Individual Instruction      Education Topics: General Nutrition Guidelines/Fats and Fiber: -Group instruction provided by verbal, written material, models and posters to present the general guidelines for heart healthy nutrition. Gives an explanation and review of dietary fats and fiber.   Cardiac Rehab from 06/26/2017 in [facility] Cardiac and Pulmonary Rehab  Date  05/22/17  Educator  CR  Instruction Review Code  1- Verbalizes Understanding      Controlling Sodium/Reading Food Labels: -Group verbal and written material supporting the discussion of sodium use in heart healthy nutrition. Review and explanation with models, verbal and written materials for utilization of the food label.   Cardiac Rehab from 06/26/2017 in [facility] Cardiac and Pulmonary Rehab  Date  05/29/17  Educator  PI  Instruction Review Code  2- Demonstrated Understanding      Exercise Physiology &  Risk Factors: - Group verbal and written instruction with models to review the exercise physiology of the cardiovascular system and associated critical values. Details cardiovascular disease risk factors and the goals associated with each risk factor.   Cardiac Rehab from 06/26/2017 in [facility] Cardiac and Pulmonary Rehab  Date  06/07/17  Educator  [facility]  Instruction Review Code  1- Verbalizes Understanding      Aerobic Exercise & Resistance Training: - Gives group verbal and written discussion on the health impact of inactivity. On the components of aerobic and resistive training programs and the benefits of this training and how to safely progress through these programs.   Cardiac Rehab from 06/26/2017 in [facility] Cardiac and Pulmonary Rehab  Date  06/12/17  Educator  [facility]  Instruction Review Code  1- Verbalizes Understanding      Flexibility, Balance, General Exercise Guidelines: - Provides group verbal and written instruction on the benefits of flexibility and balance training programs. Provides general exercise guidelines with specific guidelines to those with heart or lung disease. Demonstration and skill practice provided.   Cardiac Rehab from 06/26/2017 in [facility] Cardiac and Pulmonary Rehab  Date  06/14/17  Educator  AS  Instruction Review Code  1- Verbalizes Understanding      Stress Management: - Provides group verbal and written instruction about the health risks of elevated stress, cause of high stress, and healthy ways to reduce stress.   Cardiac Rehab from 06/26/2017 in [facility] Cardiac and Pulmonary Rehab  Date  04/24/17  Educator  [facility]  Instruction Review Code  1- Verbalizes Understanding      Depression: - Provides group verbal and written instruction on the correlation between heart/lung disease and depressed mood, treatment options, and the stigmas associated with seeking treatment.   Cardiac Rehab from 06/26/2017 in [facility] Cardiac and Pulmonary Rehab  Date  05/17/17  Educator  [facility]   Instruction Review Code (retired)  2- meets Designer, fashion/clothing & Physiology of the Heart: - Group verbal and written instruction and models provide basic cardiac anatomy and physiology, with the coronary electrical and arterial systems. Review of: AMI, Angina, Valve disease, Heart Failure, Cardiac Arrhythmia, Pacemakers, and the ICD.   Cardiac Rehab from 06/26/2017 in [facility] Cardiac and Pulmonary Rehab  Date  06/21/17  Educator  [facility]  Instruction Review Code  1- Verbalizes Understanding      Cardiac Procedures: - Group verbal and written instruction to review commonly prescribed medications for heart disease. Reviews the medication, class of the drug, and side effects. Includes the steps to properly store meds and maintain the prescription regimen. (beta blockers and nitrates)   Cardiac Rehab from 06/26/2017 in [facility] Cardiac and Pulmonary Rehab  Date  06/26/17  Educator  SB  Instruction Review Code  1- Verbalizes Understanding      Cardiac Medications I: - Group verbal and written instruction to review commonly prescribed medications for heart disease. Reviews the medication, class of the drug, and side effects. Includes the steps to properly store meds and maintain the prescription regimen.   Cardiac Rehab from 06/26/2017 in [facility] Cardiac and Pulmonary Rehab  Date  05/08/17 [8/7 Part One 8/9 Part Two]  Educator  SB      Cardiac Medications II: -Group verbal and written instruction to review commonly prescribed medications for heart disease. Reviews the medication, class of the drug, and side effects. (all  other drug classes)    Go Sex-Intimacy & Heart Disease, Get SMART - Goal Setting: - Group verbal and written instruction through game format to discuss heart disease and the return to sexual intimacy. Provides group verbal and written material to discuss and apply goal setting through the application of the S.M.A.R.T. Method.   Cardiac Rehab from 06/26/2017 in [facility] Cardiac and  Pulmonary Rehab  Date  06/26/17  Educator  SB  Instruction Review Code  1- Verbalizes Understanding      Other Matters of the Heart: - Provides group verbal, written materials and models to describe Heart Failure, Angina, Valve Disease, Peripheral Artery Disease, and Diabetes in the realm of heart disease. Includes description of the disease process and treatment options available to the cardiac patient.   Cardiac Rehab from 06/26/2017 in [facility] Cardiac and Pulmonary Rehab  Date  06/21/17  Educator  [facility]  Instruction Review Code  1- Verbalizes Understanding      Exercise & Equipment Safety: - Individual verbal instruction and demonstration of equipment use and safety with use of the equipment.   Cardiac Rehab from 06/26/2017 in [facility] Cardiac and Pulmonary Rehab  Date  04/16/17  Educator  C. EnterkinRN      Infection Prevention: - Provides verbal and written material to individual with discussion of infection control including proper hand washing and proper equipment cleaning during exercise session.   Cardiac Rehab from 06/26/2017 in [facility] Cardiac and Pulmonary Rehab  Date  04/16/17  Educator  C. EnterkinRN      Falls Prevention: - Provides verbal and written material to individual with discussion of falls prevention and safety.   Cardiac Rehab from 06/26/2017 in [facility] Cardiac and Pulmonary Rehab  Date  04/16/17  Educator  C. Pixley  Instruction Review Code (retired)  2- meets goals/outcomes      Diabetes: - Individual verbal and written instruction to review signs/symptoms of diabetes, desired ranges of glucose level fasting, after meals and with exercise. Acknowledge that pre and post exercise glucose checks will be done for 3 sessions at entry of program.   Cardiac Rehab from 06/26/2017 in [facility] Cardiac and Pulmonary Rehab  Date  04/16/17  Educator  C. Enterkin,RN [diet controlled diabetes-no medicines for it.]      Other: -Provides group and verbal instruction on various  topics (see comments)    Knowledge Questionnaire Score:     Knowledge Questionnaire Score - 06/19/17 0921      Knowledge Questionnaire Score   Pre Score 26/28   Post Score 23/28      Core Components/Risk Factors/Patient Goals at Admission:     Personal Goals and Risk Factors at Admission - 04/16/17 1208      Core Components/Risk Factors/Patient Goals on Admission    Weight Management Yes;Weight Maintenance   Intervention Weight Management: Develop a combined nutrition and exercise program designed to reach desired caloric intake, while maintaining appropriate intake of nutrient and fiber, sodium and fats, and appropriate energy expenditure required for the weight goal.;Weight Management: Provide education and appropriate resources to help participant work on and attain dietary goals.   Admit Weight 145 lb 6.4 oz (66 kg)   Goal Weight: Short Term 145 lb (65.8 kg)   Goal Weight: Long Term 142 lb (64.4 kg)   Expected Outcomes Long Term: Adherence to nutrition and physical activity/exercise program aimed toward attainment of established weight goal;Weight Maintenance: Understanding of the daily nutrition guidelines, which includes 25-35% calories from fat, 7% or less cal from saturated  fats, less than 258m cholesterol, less than 1.5gm of sodium, & 5 or more servings of fruits and vegetables daily;Short Term: Continue to assess and modify interventions until short term weight is achieved   Improve shortness of breath with ADL's Yes  c/o being tired also   Intervention Provide education, individualized exercise plan and daily activity instruction to help decrease symptoms of SOB with activities of daily living.   Expected Outcomes Short Term: Achieves a reduction of symptoms when performing activities of daily living.   Diabetes Yes   Intervention Provide education about signs/symptoms and action to take for hypo/hyperglycemia.;Provide education about proper nutrition, including hydration,  and aerobic/resistive exercise prescription along with prescribed medications to achieve blood glucose in normal ranges: Fasting glucose 65-99 mg/dL   Expected Outcomes Short Term: Participant verbalizes understanding of the signs/symptoms and immediate care of hyper/hypoglycemia, proper foot care and importance of medication, aerobic/resistive exercise and nutrition plan for blood glucose control.;Long Term: Attainment of HbA1C < 7%.      Core Components/Risk Factors/Patient Goals Review:      Goals and Risk Factor Review    Row Name 05/01/17 0936 05/29/17 1330           Core Components/Risk Factors/Patient Goals Review   Personal Goals Review Weight Management/Obesity Weight Management/Obesity;Diabetes;Hypertension      Review wt 152 today even though JRebaccareports that she is not eating well and will go to a Cardiac REhab REgisterd Dietician appt. 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 ANada Maclachlan EP suggested some neck stretches and reminded her when reading to keep good posture.  JEncarnacionhas lost some weight, but it has slowed as she is starting to be able to eat a littl bit more now.  She has not taken her blood sugar in a while, but has not felt any major swings.  Her meds may be the cause of her nausea and we discussed sitting down with her pharmacist to review her meds.  Her blood pressures have been good.      Expected Outcomes Heart healthy living  Short: Talk with pharmacist   Long: Continue to work on risk factor modification.         Core Components/Risk Factors/Patient Goals at Discharge (Final Review):      Goals and Risk Factor Review - 05/29/17 1330      Core Components/Risk Factors/Patient Goals Review   Personal Goals Review Weight Management/Obesity;Diabetes;Hypertension   Review JNanihas lost some weight, but it has slowed as she is starting to be able to eat a littl bit more now.  She has not taken her blood sugar in a  while, but has not felt any major swings.  Her meds may be the cause of her nausea and we discussed sitting down with her pharmacist to review her meds.  Her blood pressures have been good.   Expected Outcomes Short: Talk with pharmacist   Long: Continue to work on risk factor modification.      ITP Comments:     ITP Comments    Row Name 04/16/17 1206 04/16/17 1214 04/18/17 0653 05/16/17 0618 05/22/17 1115   ITP Comments Medical Review ITP created during Orientation after Cardiac REhab informed consent was signed at 10:28am today. [facility].  JOddiec/o her neck hurting when she lifts her arms up over her head. JTayanareports that she has been  careful not to lift over 5lbs and her husband even weighed her purse which did not weigh more than 5 lbs. 30 day review. Continue with ITP unless directed changes per Medical Director review     New to program 30 day review. Continue with ITP unless directed changes per Medical Director review  Matia is on an antibiotic for a UTI.  She is experiencing nausea and has decreased her workloads to keep the nausea away.  She did fine today with lower level workloads.    [place] Name 06/13/17 1117 06/26/17 1026         ITP Comments 30 day review. Continue with ITP unless directed changes per Medical Director review.   Graduates today!  Discharge ITP sent.         Comments: Discharge ITP

## 2017-08-22 ENCOUNTER — Other Ambulatory Visit: Payer: Medicare Other

## 2017-08-27 ENCOUNTER — Other Ambulatory Visit: Payer: Self-pay | Admitting: Urology

## 2017-08-27 DIAGNOSIS — N3281 Overactive bladder: Secondary | ICD-10-CM

## 2017-08-30 ENCOUNTER — Ambulatory Visit: Payer: Medicare Other | Admitting: Urology

## 2017-08-30 VITALS — BP 128/67 | HR 77 | Ht 65.0 in | Wt 138.4 lb

## 2017-08-30 DIAGNOSIS — Z8603 Personal history of neoplasm of uncertain behavior: Secondary | ICD-10-CM | POA: Diagnosis not present

## 2017-08-30 LAB — URINALYSIS, COMPLETE
BILIRUBIN UA: NEGATIVE
Glucose, UA: NEGATIVE
Ketones, UA: NEGATIVE
Nitrite, UA: NEGATIVE
PH UA: 6.5 (ref 5.0–7.5)
PROTEIN UA: NEGATIVE
RBC UA: NEGATIVE
Specific Gravity, UA: <1.005 — ABNORMAL LOW (ref 1.005–1.030)
Urobilinogen, Ur: 0.2 mg/dL (ref 0.2–1.0)

## 2017-08-30 LAB — MICROSCOPIC EXAMINATION: RBC, UA: NONE SEEN /hpf (ref 0–?)

## 2017-08-30 MED ORDER — CIPROFLOXACIN HCL 500 MG PO TABS: 500.0000 mg | ORAL_TABLET | Freq: Once | ORAL | Status: DC

## 2017-08-30 MED ORDER — LIDOCAINE HCL 2 % EX GEL: 1.0000 "application " | Freq: Once | CUTANEOUS | Status: DC

## 2017-08-30 NOTE — Progress Notes (Signed)
   08/30/17  CC:  Chief Complaint  Patient presents with  . 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 NED. A&Ox3.   No respiratory distress   Abd soft, NT, ND Normal external genitalia with patent urethral meatus  Cystoscopy Procedure Note  Patient identification was confirmed, informed consent was obtained, and patient was prepped using Betadine solution.  Lidocaine jelly was administered per urethral meatus.    Preoperative abx where received prior to procedure.    Procedure: - Flexible cystoscope introduced, without any difficulty.   - Thorough search of the bladder revealed:    normal urethral meatus    normal urothelium    no stones    no ulcers     no tumors    no urethral polyps    no trabeculation  - Ureteral orifices were normal in position and appearance.  Post-Procedure: - Patient tolerated the procedure well  Assessment/ Plan:  1. History of bladder cancer/CIS Cystoscopy was benign today.  We'll have her undergo this followed by repeat cystoscopy in 3 months.  Will be due for maintenance BCG after next follow-up.  Will need maintenance BCG until early 2020 assuming no recurrence.  Urine sent for cystology for cytology.    Nickie Retort, MD

## 2017-09-06 ENCOUNTER — Other Ambulatory Visit: Payer: Self-pay | Admitting: Urology

## 2017-11-29 ENCOUNTER — Encounter: Payer: Self-pay | Admitting: Urology

## 2017-11-29 ENCOUNTER — Ambulatory Visit: Payer: Medicare Other | Admitting: Urology

## 2017-11-29 VITALS — BP 129/76 | HR 87 | Ht 65.0 in | Wt 143.1 lb

## 2017-11-29 DIAGNOSIS — C679 Malignant neoplasm of bladder, unspecified: Secondary | ICD-10-CM

## 2017-11-29 LAB — URINALYSIS, COMPLETE
Bilirubin, UA: NEGATIVE
GLUCOSE, UA: NEGATIVE
KETONES UA: NEGATIVE
NITRITE UA: NEGATIVE
Protein, UA: NEGATIVE
Specific Gravity, UA: 1.01 (ref 1.005–1.030)
UUROB: 0.2 mg/dL (ref 0.2–1.0)
pH, UA: 6.5 (ref 5.0–7.5)

## 2017-11-29 LAB — MICROSCOPIC EXAMINATION

## 2017-11-29 MED ORDER — CIPROFLOXACIN HCL 500 MG PO TABS
500.0000 mg | ORAL_TABLET | Freq: Once | ORAL | Status: AC
  Administered 2017-11-29: 500 mg via ORAL

## 2017-11-29 MED ORDER — LIDOCAINE HCL 2 % EX GEL
1.0000 "application " | Freq: Once | CUTANEOUS | Status: AC
  Administered 2017-11-29: 1 via URETHRAL

## 2017-11-29 NOTE — Progress Notes (Signed)
   11/29/17  CC:  Chief Complaint  Patient presents with  . 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 Urine sent for cytology    There were no vitals taken for this visit. NED. A&Ox3.   No respiratory distress   Abd soft, NT, ND Normal external genitalia with patent urethral meatus  Cystoscopy Procedure Note  Patient identification was confirmed, informed consent was obtained, and patient was prepped using Betadine solution.  Lidocaine jelly was administered per urethral meatus.    Preoperative abx where received prior to procedure.    Procedure: - Flexible cystoscope introduced, without any difficulty.   - Thorough search of the bladder revealed:    normal urethral meatus    normal urothelium    no stones    no ulcers     no tumors    no urethral polyps    no trabeculation  - Ureteral orifices were normal in position and appearance.  Post-Procedure: - Patient tolerated the procedure well  Assessment/ Plan:  1. History of bladder cancer/CIS Cystoscopy was benign today.  We'll have her undergo this followed by repeat cystoscopy in 3 months.  Patient is due for maintenance BCG6 per standard protocol.  However, there is a Producer, television/film/video of BCG.  We will hold off on BCG at this time due to her risk stratification and AUA guidelines.  She will follow-up in 3 months for repeat cystoscopy and cytology.  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    Nickie Retort, MD

## 2017-11-30 ENCOUNTER — Other Ambulatory Visit: Payer: Self-pay | Admitting: Urology

## 2017-11-30 DIAGNOSIS — N3281 Overactive bladder: Secondary | ICD-10-CM

## 2018-01-22 ENCOUNTER — Other Ambulatory Visit: Payer: Self-pay | Admitting: Internal Medicine

## 2018-01-22 DIAGNOSIS — S32050A Wedge compression fracture of fifth lumbar vertebra, initial encounter for closed fracture: Secondary | ICD-10-CM

## 2018-01-28 ENCOUNTER — Ambulatory Visit
Admission: RE | Admit: 2018-01-28 | Discharge: 2018-01-28 | Disposition: A | Discharge status: Home / Self Care | Payer: Medicare Other | Source: Ambulatory Visit | Attending: Internal Medicine | Admitting: Internal Medicine

## 2018-01-28 DIAGNOSIS — M47817 Spondylosis without myelopathy or radiculopathy, lumbosacral region: Secondary | ICD-10-CM | POA: Diagnosis not present

## 2018-01-28 DIAGNOSIS — M5136 Other intervertebral disc degeneration, lumbar region: Secondary | ICD-10-CM | POA: Diagnosis not present

## 2018-01-28 DIAGNOSIS — M4317 Spondylolisthesis, lumbosacral region: Secondary | ICD-10-CM | POA: Insufficient documentation

## 2018-01-28 DIAGNOSIS — S32050A Wedge compression fracture of fifth lumbar vertebra, initial encounter for closed fracture: Secondary | ICD-10-CM | POA: Diagnosis present

## 2018-02-28 ENCOUNTER — Other Ambulatory Visit: Payer: Medicare Other

## 2018-03-27 ENCOUNTER — Ambulatory Visit: Payer: Medicare Other | Admitting: Urology

## 2018-03-27 DIAGNOSIS — N3000 Acute cystitis without hematuria: Secondary | ICD-10-CM

## 2018-03-27 MED ORDER — CIPROFLOXACIN HCL 500 MG PO TABS: 500.0000 mg | ORAL_TABLET | Freq: Once | ORAL | Status: DC

## 2018-03-27 MED ORDER — CEPHALEXIN 500 MG PO CAPS: 500.0000 mg | ORAL_CAPSULE | Freq: Four times a day (QID) | ORAL | 0 refills | Status: DC

## 2018-03-27 MED ORDER — LIDOCAINE HCL URETHRAL/MUCOSAL 2 % EX GEL: 1.0000 "application " | Freq: Once | CUTANEOUS | Status: DC

## 2018-03-27 NOTE — Progress Notes (Signed)
    Patient presented today for routine surveillance cystoscopy.  Unfortunately, her urinalysis is suspicious for infection, nitrite positive.  We will go ahead and start her on Keflex and reschedule her cystoscopy.  We will adjust antibiotics as needed.  She is agreeable to this plan.  [name], MD

## 2018-03-28 LAB — URINALYSIS, COMPLETE
Bilirubin, UA: NEGATIVE
Glucose, UA: NEGATIVE
KETONES UA: NEGATIVE
NITRITE UA: POSITIVE — AB
PH UA: 5.5 (ref 5.0–7.5)
Protein, UA: NEGATIVE
SPEC GRAV UA: 1.01 (ref 1.005–1.030)
Urobilinogen, Ur: 0.2 mg/dL (ref 0.2–1.0)

## 2018-03-28 LAB — MICROSCOPIC EXAMINATION: RBC MICROSCOPIC, UA: NONE SEEN /HPF (ref 0–2)

## 2018-04-01 LAB — CULTURE, URINE COMPREHENSIVE

## 2018-04-08 ENCOUNTER — Other Ambulatory Visit: Payer: Medicare Other

## 2018-04-29 ENCOUNTER — Encounter: Payer: Self-pay | Admitting: Urology

## 2018-04-29 ENCOUNTER — Telehealth: Payer: Self-pay | Admitting: Urology

## 2018-04-29 ENCOUNTER — Ambulatory Visit: Payer: Medicare Other | Admitting: Urology

## 2018-04-29 VITALS — BP 149/78 | HR 76 | Ht 65.0 in | Wt 150.1 lb

## 2018-04-29 DIAGNOSIS — C679 Malignant neoplasm of bladder, unspecified: Secondary | ICD-10-CM

## 2018-04-29 LAB — URINALYSIS, COMPLETE
Bilirubin, UA: NEGATIVE
Glucose, UA: NEGATIVE
Ketones, UA: NEGATIVE
Nitrite, UA: POSITIVE — AB
Protein, UA: NEGATIVE
Specific Gravity, UA: 1.015 (ref 1.005–1.030)
Urobilinogen, Ur: 0.2 mg/dL (ref 0.2–1.0)
pH, UA: 5.5 (ref 5.0–7.5)

## 2018-04-29 LAB — MICROSCOPIC EXAMINATION

## 2018-04-29 MED ORDER — CIPROFLOXACIN HCL 500 MG PO TABS: 500.0000 mg | ORAL_TABLET | Freq: Once | ORAL | Status: DC

## 2018-04-29 MED ORDER — LIDOCAINE HCL URETHRAL/MUCOSAL 2 % EX GEL: 1.0000 "application " | Freq: Once | CUTANEOUS | Status: DC

## 2018-04-29 MED ORDER — NITROFURANTOIN MONOHYD MACRO 100 MG PO CAPS: 100.0000 mg | ORAL_CAPSULE | Freq: Two times a day (BID) | ORAL | 11 refills | Status: DC

## 2018-04-29 MED ORDER — CEFDINIR 300 MG PO CAPS: 300.0000 mg | ORAL_CAPSULE | Freq: Two times a day (BID) | ORAL | 0 refills | Status: DC

## 2018-04-29 NOTE — Telephone Encounter (Signed)
Pt is confused about Rx she received today.  Please give her a call at [phone]

## 2018-04-29 NOTE — Progress Notes (Signed)
04/29/2018 9:37 AM   Adriana Anderson [birth year] [id number]  Referring provider: Baxter Hire, MD [place], [place] [address]  Chief Complaint  Patient presents with  . Cysto    HPI: Dr. 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 Urine sent for cytology  1. History of bladder cancer/CIS Cystoscopy was benign today. We'll have her undergo this followed by repeat cystoscopy in 3 months.Patient is due for maintenance BCG6 per standard protocol.  However, there is a Producer, television/film/video of BCG.  We will hold off on BCG at this time due to her risk stratification and AUA guidelines.  She will follow-up in 3 months for repeat cystoscopy and cytology.  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  Today Last cystoscopy postponed due to positive urine.  Urine culture was positive.  The patient is still having burning and suprapubic discomfort.  She says she still infected.  Urine looked infected and sent for culture.     PMH: Past Medical History:  Diagnosis Date  . Acid reflux   . Bladder cancer ([place])   . Bladder mass   . Diabetes mellitus, type 2 ([place])   . GERD (gastroesophageal reflux disease)   . History of biopsy of bladder   . History of cystoscopy   . Liver disease   . PONV (postoperative nausea and vomiting)     Surgical History: Past Surgical History:  Procedure Laterality Date  . APPENDECTOMY    . CYSTOSCOPY WITH BIOPSY N/A 11/15/2015   Procedure: CYSTOSCOPY WITH BIOPSY;  Surgeon: [name], MD;  Location:  [facility];  Service: Urology;  Laterality: N/A;  . LEFT HEART CATH AND CORONARY ANGIOGRAPHY Left 02/23/2017   Procedure: Left Heart Cath and Coronary Angiography;  Surgeon: Isaias Cowman, MD;  Location: [facility];  Service: Cardiovascular;  Laterality: Left;  . TOE SURGERY      Home Medications:  Allergies as of 04/29/2018      Reactions   Tetanus-diphth-acell Pertussis Swelling   Sulfa Antibiotics Rash      Medication List        Accurate as of 04/29/18  9:37 AM. Always use your most recent med list.          apixaban 5 MG Tabs tablet Commonly known as:  ELIQUIS Take by mouth.   aspirin EC 81 MG tablet Take 81 mg by mouth at bedtime.   atorvastatin 40 MG tablet Commonly known as:  LIPITOR Take by mouth.   CALTRATE 600+D PO Take 1 tablet by mouth 2 (two) times daily.   estradiol 0.1 MG/GM vaginal cream Commonly known as:  ESTRACE VAGINAL USE 1 APPLICATION VAGINALLY ONCE A WEEK   loratadine 10 MG tablet Commonly known as:  CLARITIN Take 10 mg by mouth daily as needed for allergies.   metoprolol tartrate 25 MG tablet Commonly known as:  LOPRESSOR Take by mouth.   omeprazole-sodium bicarbonate 40-1100 MG capsule Commonly known as:  ZEGERID Take 1 capsule  by mouth 2 (two) times daily. 20 instesd of 40   polyethylene glycol powder powder Commonly known as:  GLYCOLAX/MIRALAX Take 17 g by mouth every other day.   ranitidine 150 MG tablet Commonly known as:  ZANTAC Take 150 mg by mouth at bedtime as needed for heartburn.   solifenacin 10 MG tablet Commonly known as:  VESICARE Take 1 tablet (10 mg total) by mouth daily.   TUMS EXTRA STRENGTH 750 750 MG chewable tablet Generic drug:  calcium carbonate Chew 2,250-3,750 tablets by mouth at bedtime as needed for heartburn.       Allergies:  Allergies  Allergen Reactions  . Tetanus-Diphth-Acell Pertussis Swelling  . Sulfa Antibiotics Rash    Family History: Family History  Problem  Relation Age of Onset  . Bladder Cancer Brother        x 3   . Brain cancer Father   . Kidney disease Father   . Diabetes Unknown   . Heart attack Mother   . Heart failure Sister   . Bladder Cancer Brother   . Prostate cancer Neg Hx   . Kidney cancer Neg Hx     Social History:  reports that she quit smoking about 36 years ago. She has never used smokeless tobacco. She reports that she drinks alcohol. She reports that she does not use drugs.  ROS:                                        Physical Exam: BP (!) 149/78 (BP Location: Left Arm, Patient Position: Sitting, Cuff Size: Normal)   Pulse 76   Ht 5\' 5"  (1.651 m)   Wt 150 lb 1.6 oz (68.1 kg)   BMI 24.98 kg/m   Constitutional:  Alert and oriented, No acute distress.  Laboratory Data: Lab Results  Component Value Date   WBC 5.7 06/08/2012   HGB 11.8 (L) 06/08/2012   HCT 32.6 (L) 06/08/2012   MCV 95 06/08/2012   PLT 158 06/08/2012    Lab Results  Component Value Date   CREATININE 0.96 11/04/2015    No results found for: PSA  No results found for: TESTOSTERONE  No results found for: HGBA1C  Urinalysis    Component Value Date/Time   COLORURINE Amber 06/06/2012 1054   APPEARANCEUR Cloudy (A) 03/27/2018 0821   LABSPEC 1.014 06/06/2012 1054   PHURINE 5.0 06/06/2012 1054   GLUCOSEU Negative 03/27/2018 0821   GLUCOSEU Negative 06/06/2012 1054   HGBUR 1+ 06/06/2012 1054   BILIRUBINUR Negative 03/27/2018 0821   BILIRUBINUR Negative 06/06/2012 1054   KETONESUR Trace 06/06/2012 1054   PROTEINUR Negative 03/27/2018 0821   PROTEINUR 30 mg/dL 06/06/2012 1054   NITRITE Positive (A) 03/27/2018 0821   NITRITE Negative 06/06/2012 1054   LEUKOCYTESUR 3+ (A) 03/27/2018 0821   LEUKOCYTESUR 3+ 06/06/2012 1054    Pertinent Imaging:   Assessment & Plan: Based upon allergies and sensitivities I called in Omnicef 300 mg twice a day for 1 week; the patient was then started on daily suppression therapy  with 100 mg of Macrodantin.  She will return for cystoscopy in about 3 weeks.  The pathophysiology and treatment of recurrent bladder infections discussed.  I did not order a renal ultrasound.  1. Malignant neoplasm of urinary bladder, unspecified site (HCC)  - Urinalysis, Complete - ciprofloxacin (CIPRO) tablet 500 mg - lidocaine (XYLOCAINE) 2 % jelly 1 application  No follow-ups on file.  Reece Packer, MD  [facility] [address] [place], [place] [address] [phone]

## 2018-04-30 NOTE — Telephone Encounter (Signed)
Left message for pt to call office

## 2018-04-30 NOTE — Telephone Encounter (Signed)
Pt was confused about how often to take abx, discussed instructions per Dr. Matilde Sprang notes.

## 2018-05-02 LAB — CULTURE, URINE COMPREHENSIVE

## 2018-05-20 ENCOUNTER — Other Ambulatory Visit: Payer: Medicare Other

## 2018-05-27 ENCOUNTER — Encounter: Payer: Self-pay | Admitting: Urology

## 2018-05-27 ENCOUNTER — Ambulatory Visit: Payer: Medicare Other | Admitting: Urology

## 2018-05-27 VITALS — BP 144/79 | HR 77 | Ht 65.0 in | Wt 149.6 lb

## 2018-05-27 DIAGNOSIS — C679 Malignant neoplasm of bladder, unspecified: Secondary | ICD-10-CM

## 2018-05-27 LAB — MICROSCOPIC EXAMINATION
BACTERIA UA: NONE SEEN
RBC MICROSCOPIC, UA: NONE SEEN /HPF (ref 0–2)

## 2018-05-27 LAB — URINALYSIS, COMPLETE
Bilirubin, UA: NEGATIVE
GLUCOSE, UA: NEGATIVE
Ketones, UA: NEGATIVE
Leukocytes, UA: NEGATIVE
NITRITE UA: NEGATIVE
PH UA: 5.5 (ref 5.0–7.5)
Protein, UA: NEGATIVE
RBC, UA: NEGATIVE
Specific Gravity, UA: 1.015 (ref 1.005–1.030)
UUROB: 0.2 mg/dL (ref 0.2–1.0)

## 2018-05-27 MED ORDER — NITROFURANTOIN MONOHYD MACRO 100 MG PO CAPS: 100.0000 mg | ORAL_CAPSULE | Freq: Every day | ORAL | 11 refills | Status: DC

## 2018-05-27 MED ORDER — CIPROFLOXACIN HCL 500 MG PO TABS
500.0000 mg | ORAL_TABLET | Freq: Once | ORAL | Status: AC
  Administered 2018-05-27: 500 mg via ORAL

## 2018-05-27 MED ORDER — LIDOCAINE HCL URETHRAL/MUCOSAL 2 % EX GEL
1.0000 "application " | Freq: Once | CUTANEOUS | Status: AC
  Administered 2018-05-27: 1 via URETHRAL

## 2018-05-27 NOTE — Progress Notes (Signed)
05/27/2018 9:23 AM   Adriana Anderson 1941-09-04 [id number]  Referring provider: Baxter Hire, MD [place], [place] [address]  Chief Complaint  Patient presents with  . Cysto    HPI: In July the patient was here for surveillance cystoscopy.  She has a history of bladder cancer.  She was having recurrent bladder infections.  I called in [place] and then placed her on daily suppression therapy with Macrodantin.  A renal ultrasound was not ordered.  Her urine culture in June and July were positive.   Today Frequency is stable.  No blood in urine.  Cystoscopy: Utilizing sterile technique patient underwent flexible cystoscopy.  Bladder mucosa and trigone were normal.  There is no cystitis.  There is no carcinoma.  Patient tolerated procedure well   PMH: Past Medical History:  Diagnosis Date  . Acid reflux   . Bladder cancer ([place])   . Bladder mass   . Diabetes mellitus, type 2 ([place])   . GERD (gastroesophageal reflux disease)   . History of biopsy of bladder   . History of cystoscopy   . Liver disease   . PONV (postoperative nausea and vomiting)     Surgical History: Past Surgical History:  Procedure Laterality Date  . APPENDECTOMY    . CYSTOSCOPY WITH BIOPSY N/A 11/15/2015   Procedure: CYSTOSCOPY WITH BIOPSY;  Surgeon: [name], MD;  Location: [facility];  Service: Urology;  Laterality: N/A;  . LEFT HEART CATH AND CORONARY ANGIOGRAPHY Left 02/23/2017   Procedure: Left Heart Cath and Coronary Angiography;  Surgeon: Isaias Cowman, MD;  Location: [facility];  Service: Cardiovascular;  Laterality: Left;  . TOE SURGERY      Home Medications:  Allergies as of 05/27/2018      Reactions   Tetanus-diphth-acell Pertussis Swelling   Sulfa Antibiotics Rash      Medication List        Accurate as of 05/27/18  9:23 AM. Always use your most recent med list.          apixaban 5 MG Tabs tablet Commonly known as:  ELIQUIS Take by  mouth.   aspirin EC 81 MG tablet Take 81 mg by mouth at bedtime.   atorvastatin 40 MG tablet Commonly known as:  LIPITOR Take by mouth.   CALTRATE 600+D PO Take 1 tablet by mouth 2 (two) times daily.   estradiol 0.1 MG/GM vaginal cream Commonly known as:  ESTRACE USE 1 APPLICATION VAGINALLY ONCE A WEEK   loratadine 10 MG tablet Commonly known as:  CLARITIN Take 10 mg by mouth daily as needed for allergies.   metoprolol succinate 25 MG 24 hr tablet Commonly known as:  TOPROL-XL TAKE ONE AND ONE-HALF TABLETS TWICE DAILY   nitrofurantoin (macrocrystal-monohydrate) 100 MG capsule Commonly known as:  MACROBID Take 1 capsule (100 mg total) by mouth every 12 (twelve) hours.   omeprazole-sodium bicarbonate 40-1100 MG capsule Commonly known as:  ZEGERID Take 1 capsule by mouth 2 (two) times daily. 20 instesd of 40   pantoprazole 40 MG tablet Commonly known as:  PROTONIX Take 40 mg by mouth daily.   polyethylene glycol powder powder Commonly known as:  GLYCOLAX/MIRALAX Take 17 g by mouth every other day.   ranitidine 150 MG tablet Commonly known as:  ZANTAC Take 150 mg by mouth at bedtime as needed for heartburn.   solifenacin 10 MG tablet Commonly known as:  VESICARE Take 1 tablet (10 mg total) by mouth daily.  TUMS EXTRA STRENGTH 750 750 MG chewable tablet Generic drug:  calcium carbonate Chew 2,250-3,750 tablets by mouth at bedtime as needed for heartburn.       Allergies:  Allergies  Allergen Reactions  . Tetanus-Diphth-Acell Pertussis Swelling  . Sulfa Antibiotics Rash    Family History: Family History  Problem Relation Age of Onset  . Bladder Cancer Brother        x 3   . Brain cancer Father   . Kidney disease Father   . Diabetes Unknown   . Heart attack Mother   . Heart failure Sister   . Bladder Cancer Brother   . Prostate cancer Neg Hx   . Kidney cancer Neg Hx     Social History:  reports that she quit smoking about 36 years ago. She has  never used smokeless tobacco. She reports that she drinks alcohol. She reports that she does not use drugs.  ROS:                                        Physical Exam: BP (!) 144/79 (BP Location: Left Arm, Patient Position: Sitting, Cuff Size: Normal)   Pulse 77   Ht 5\' 5"  (1.651 m)   Wt 149 lb 9.6 oz (67.9 kg)   BMI 24.89 kg/m   Constitutional:  Alert and oriented, No acute distress.   Laboratory Data: Lab Results  Component Value Date   WBC 5.7 06/08/2012   HGB 11.8 (L) 06/08/2012   HCT 32.6 (L) 06/08/2012   MCV 95 06/08/2012   PLT 158 06/08/2012    Lab Results  Component Value Date   CREATININE 0.96 11/04/2015    No results found for: PSA  No results found for: TESTOSTERONE  No results found for: HGBA1C  Urinalysis    Component Value Date/Time   COLORURINE Amber 06/06/2012 1054   APPEARANCEUR Cloudy (A) 04/29/2018 0928   LABSPEC 1.014 06/06/2012 1054   PHURINE 5.0 06/06/2012 1054   GLUCOSEU Negative 04/29/2018 0928   GLUCOSEU Negative 06/06/2012 1054   HGBUR 1+ 06/06/2012 1054   BILIRUBINUR Negative 04/29/2018 0928   BILIRUBINUR Negative 06/06/2012 1054   KETONESUR Trace 06/06/2012 1054   PROTEINUR Negative 04/29/2018 0928   PROTEINUR 30 mg/dL 06/06/2012 1054   NITRITE Positive (A) 04/29/2018 0928   NITRITE Negative 06/06/2012 1054   LEUKOCYTESUR 1+ (A) 04/29/2018 0928   LEUKOCYTESUR 3+ 06/06/2012 1054    Pertinent Imaging:   Assessment & Plan: Follow-up in 3 months with Dr. Diamantina Providence for cystoscopy and for long-term follow-up and surveillance of patient.  Carcinoma history dictated in chart April 29, 2018.  Clinically no infections and stay on daily Macrodantin  1. Malignant neoplasm of urinary bladder, unspecified site (HCC)  - Urinalysis, Complete - ciprofloxacin (CIPRO) tablet 500 mg - lidocaine (XYLOCAINE) 2 % jelly 1 application   No follow-ups on file.  Reece Packer, MD  [facility] [address] [place], [place] [address] [phone]

## 2018-05-31 ENCOUNTER — Other Ambulatory Visit: Payer: Self-pay

## 2018-05-31 DIAGNOSIS — N3281 Overactive bladder: Secondary | ICD-10-CM

## 2018-05-31 MED ORDER — SOLIFENACIN SUCCINATE 10 MG PO TABS: 10.0000 mg | ORAL_TABLET | Freq: Every day | ORAL | 3 refills | Status: DC

## 2018-06-19 ENCOUNTER — Telehealth: Payer: Self-pay | Admitting: Urology

## 2018-06-19 MED ORDER — ESTRADIOL 0.1 MG/GM VA CREA: TOPICAL_CREAM | VAGINAL | 3 refills | Status: DC

## 2018-06-19 NOTE — Telephone Encounter (Signed)
Patient needs a new prescription for Estrace cream to be sent to the [facility] on [facility] [address] [facility] [facility] in [place].    She can be reached at [phone].

## 2018-06-19 NOTE — Telephone Encounter (Signed)
Script sent  

## 2018-07-17 DIAGNOSIS — K559 Vascular disorder of intestine, unspecified: Secondary | ICD-10-CM | POA: Insufficient documentation

## 2018-08-19 ENCOUNTER — Encounter: Payer: Self-pay | Admitting: Urology

## 2018-08-19 ENCOUNTER — Ambulatory Visit: Payer: Medicare Other | Admitting: Urology

## 2018-08-19 VITALS — BP 138/81 | HR 81 | Ht 65.0 in | Wt 151.4 lb

## 2018-08-19 DIAGNOSIS — C679 Malignant neoplasm of bladder, unspecified: Secondary | ICD-10-CM

## 2018-08-19 LAB — MICROSCOPIC EXAMINATION: RBC, UA: NONE SEEN /hpf (ref 0–2)

## 2018-08-19 LAB — URINALYSIS, COMPLETE
Bilirubin, UA: NEGATIVE
Glucose, UA: NEGATIVE
Ketones, UA: NEGATIVE
Nitrite, UA: NEGATIVE
Protein, UA: NEGATIVE
RBC UA: NEGATIVE
Specific Gravity, UA: 1.01 (ref 1.005–1.030)
Urobilinogen, Ur: 0.2 mg/dL (ref 0.2–1.0)
pH, UA: 6 (ref 5.0–7.5)

## 2018-08-19 NOTE — Progress Notes (Signed)
Bladder cancer surveillance note  Initial Diagnosis of Bladder  Year: 12/2013 Pathology: HG T1  Recurrent Bladder Cancer Diagnosis  Date   Pathology 11/2015  CIS   Treatments for Bladder Cancer 12/2013: TURBT HG T1, Induction BCG 11/2015: TURBT CIS 11/2015 Repeat induction BCG 06/2016 mBCG 10/2016: mBCG 06/2017: mBCG  AUA Risk Category High  Cystoscopy Procedure Note:  After informed consent and discussion of the procedure and its risks, ARDYTHE KLUTE was positioned and prepped in the standard fashion. Cystoscopy was performed with the a flexible cystoscope. The urethra, bladder neck and entire bladder was visualized in a standard fashion, and mucosa grossly normal. Small scattered erythema at posterior wall minimally concerning. The ureteral orifices were visualized in their normal location and orientation. Retroflexion normal. Cytology sent.  PLAN: Follow up cytology RTC 6 months with CT urogram and cysto  Nickolas Madrid, MD 08/19/2018

## 2018-08-22 ENCOUNTER — Other Ambulatory Visit: Payer: Self-pay | Admitting: Urology

## 2018-09-26 ENCOUNTER — Telehealth: Payer: Self-pay | Admitting: Urology

## 2018-09-26 NOTE — Telephone Encounter (Signed)
Pt got a letter from [employer] about changes to your prescription drug coverage, her Vesicare and Estrace Cream.  Can you please call pt and let her know if there are any alternatives to these that she might can switch to.  [phone]

## 2018-09-27 NOTE — Telephone Encounter (Signed)
Left vm for pt to call back, pt will need to contact insurance for covered alteratives and once she knows call us back with information

## 2018-10-03 NOTE — Telephone Encounter (Signed)
Pt returning call about insurance coverage for her Vesicare and Estrace cream. Please advise pt at [phone].

## 2018-10-04 NOTE — Telephone Encounter (Signed)
Patient is going to contact her insurance.  They origianlly told her that vesicare was a tier 3.  Advised patient to see what the lower tier options were.  She will contact us once she gets that information.

## 2018-10-04 NOTE — Telephone Encounter (Signed)
Left message advising patient to let us know what her insurance recommends she tries that they will cover.

## 2018-10-21 DIAGNOSIS — E785 Hyperlipidemia, unspecified: Secondary | ICD-10-CM | POA: Insufficient documentation

## 2019-02-14 ENCOUNTER — Ambulatory Visit
Admission: RE | Admit: 2019-02-14 | Discharge: 2019-02-14 | Disposition: A | Discharge status: Home / Self Care | Payer: Medicare Other | Source: Ambulatory Visit | Attending: Urology | Admitting: Urology

## 2019-02-14 ENCOUNTER — Other Ambulatory Visit: Payer: Self-pay

## 2019-02-14 DIAGNOSIS — C679 Malignant neoplasm of bladder, unspecified: Secondary | ICD-10-CM

## 2019-02-14 LAB — POCT I-STAT CREATININE: Creatinine, Ser: 0.8 mg/dL (ref 0.44–1.00)

## 2019-02-14 MED ORDER — IOHEXOL 300 MG/ML  SOLN
125.0000 mL | Freq: Once | INTRAMUSCULAR | Status: AC | PRN
  Administered 2019-02-14: 125 mL via INTRAVENOUS

## 2019-02-18 ENCOUNTER — Other Ambulatory Visit: Payer: Medicare Other | Admitting: Urology

## 2019-02-20 ENCOUNTER — Other Ambulatory Visit: Payer: Self-pay

## 2019-02-20 ENCOUNTER — Encounter: Payer: Self-pay | Admitting: Urology

## 2019-02-20 ENCOUNTER — Ambulatory Visit: Payer: Medicare Other | Admitting: Urology

## 2019-02-20 VITALS — BP 122/73 | HR 86 | Ht 65.0 in | Wt 154.0 lb

## 2019-02-20 DIAGNOSIS — Z8603 Personal history of neoplasm of uncertain behavior: Secondary | ICD-10-CM | POA: Diagnosis not present

## 2019-02-20 DIAGNOSIS — C679 Malignant neoplasm of bladder, unspecified: Secondary | ICD-10-CM

## 2019-02-20 LAB — URINALYSIS, COMPLETE
Bilirubin, UA: NEGATIVE
Glucose, UA: NEGATIVE
Ketones, UA: NEGATIVE
Leukocytes,UA: NEGATIVE
Nitrite, UA: NEGATIVE
Protein,UA: NEGATIVE
RBC, UA: NEGATIVE
Specific Gravity, UA: 1.01 (ref 1.005–1.030)
Urobilinogen, Ur: 0.2 mg/dL (ref 0.2–1.0)
pH, UA: 5 (ref 5.0–7.5)

## 2019-02-20 LAB — MICROSCOPIC EXAMINATION
Bacteria, UA: NONE SEEN
RBC, Urine: NONE SEEN /hpf (ref 0–2)

## 2019-02-20 NOTE — Progress Notes (Signed)
Bladder cancer surveillance note  Initial Diagnosis of Bladder Year: 12/2013 Pathology: HG T1  Recurrent Bladder Cancer Diagnosis  Date                 Pathology 11/2015             CIS   Treatments for Bladder Cancer 12/2013: TURBT HG T1, Induction BCG 11/2015: TURBT CIS 11/2015 Repeat induction BCG 06/2016 mBCG 10/2016: mBCG 06/2017: mBCG  AUA Risk Category High  Cystoscopy Procedure Note:  After informed consent and discussion of the procedure and its risks, Adriana Anderson was positioned and prepped in the standard fashion. Cystoscopy was performed with the a flexible cystoscope. The urethra, bladder neck and entire bladder was visualized in a standard fashion, and mucosa grossly normal. The ureteral orifices were visualized in their normal location and orientation. Retroflexion normal.   CT urogram 02/14/2019 reviewed, NED  PLAN: RTC 6 months for cystoscopy Next CT imaging due in 2 years (01/2021)  Nickolas Madrid, MD 02/20/2019

## 2019-05-19 ENCOUNTER — Other Ambulatory Visit: Payer: Self-pay

## 2019-05-19 DIAGNOSIS — N3281 Overactive bladder: Secondary | ICD-10-CM

## 2019-05-19 MED ORDER — SOLIFENACIN SUCCINATE 10 MG PO TABS: 10.0000 mg | ORAL_TABLET | Freq: Every day | ORAL | 3 refills | Status: DC

## 2019-05-21 ENCOUNTER — Emergency Department: Payer: Medicare Other

## 2019-05-21 ENCOUNTER — Other Ambulatory Visit: Payer: Self-pay

## 2019-05-21 ENCOUNTER — Inpatient Hospital Stay
Admission: EM | Admit: 2019-05-21 | Discharge: 2019-05-25 | DRG: 445 | Disposition: A | Discharge status: Home / Self Care | Payer: Medicare Other | Attending: Internal Medicine | Admitting: Internal Medicine

## 2019-05-21 ENCOUNTER — Encounter: Payer: Self-pay | Admitting: Emergency Medicine

## 2019-05-21 DIAGNOSIS — R509 Fever, unspecified: Secondary | ICD-10-CM

## 2019-05-21 DIAGNOSIS — R740 Nonspecific elevation of levels of transaminase and lactic acid dehydrogenase [LDH]: Secondary | ICD-10-CM | POA: Diagnosis present

## 2019-05-21 DIAGNOSIS — K819 Cholecystitis, unspecified: Secondary | ICD-10-CM | POA: Diagnosis not present

## 2019-05-21 DIAGNOSIS — Z20828 Contact with and (suspected) exposure to other viral communicable diseases: Secondary | ICD-10-CM | POA: Diagnosis present

## 2019-05-21 DIAGNOSIS — Z882 Allergy status to sulfonamides status: Secondary | ICD-10-CM

## 2019-05-21 DIAGNOSIS — E871 Hypo-osmolality and hyponatremia: Secondary | ICD-10-CM | POA: Diagnosis present

## 2019-05-21 DIAGNOSIS — Z7982 Long term (current) use of aspirin: Secondary | ICD-10-CM

## 2019-05-21 DIAGNOSIS — Z87891 Personal history of nicotine dependence: Secondary | ICD-10-CM

## 2019-05-21 DIAGNOSIS — I2510 Atherosclerotic heart disease of native coronary artery without angina pectoris: Secondary | ICD-10-CM | POA: Diagnosis present

## 2019-05-21 DIAGNOSIS — Z87440 Personal history of urinary (tract) infections: Secondary | ICD-10-CM

## 2019-05-21 DIAGNOSIS — Z79899 Other long term (current) drug therapy: Secondary | ICD-10-CM

## 2019-05-21 DIAGNOSIS — K810 Acute cholecystitis: Secondary | ICD-10-CM | POA: Diagnosis not present

## 2019-05-21 DIAGNOSIS — Z8249 Family history of ischemic heart disease and other diseases of the circulatory system: Secondary | ICD-10-CM

## 2019-05-21 DIAGNOSIS — R52 Pain, unspecified: Secondary | ICD-10-CM

## 2019-05-21 DIAGNOSIS — R945 Abnormal results of liver function studies: Secondary | ICD-10-CM

## 2019-05-21 DIAGNOSIS — Z7901 Long term (current) use of anticoagulants: Secondary | ICD-10-CM

## 2019-05-21 DIAGNOSIS — Z841 Family history of disorders of kidney and ureter: Secondary | ICD-10-CM

## 2019-05-21 DIAGNOSIS — E876 Hypokalemia: Secondary | ICD-10-CM | POA: Diagnosis not present

## 2019-05-21 DIAGNOSIS — E119 Type 2 diabetes mellitus without complications: Secondary | ICD-10-CM | POA: Diagnosis present

## 2019-05-21 DIAGNOSIS — K769 Liver disease, unspecified: Secondary | ICD-10-CM | POA: Diagnosis present

## 2019-05-21 DIAGNOSIS — Z8052 Family history of malignant neoplasm of bladder: Secondary | ICD-10-CM

## 2019-05-21 DIAGNOSIS — K219 Gastro-esophageal reflux disease without esophagitis: Secondary | ICD-10-CM | POA: Diagnosis present

## 2019-05-21 DIAGNOSIS — Z951 Presence of aortocoronary bypass graft: Secondary | ICD-10-CM

## 2019-05-21 DIAGNOSIS — R7989 Other specified abnormal findings of blood chemistry: Secondary | ICD-10-CM

## 2019-05-21 DIAGNOSIS — R7401 Elevation of levels of liver transaminase levels: Secondary | ICD-10-CM | POA: Diagnosis present

## 2019-05-21 DIAGNOSIS — K8020 Calculus of gallbladder without cholecystitis without obstruction: Secondary | ICD-10-CM

## 2019-05-21 DIAGNOSIS — I700 Atherosclerosis of aorta: Secondary | ICD-10-CM | POA: Diagnosis present

## 2019-05-21 DIAGNOSIS — I4821 Permanent atrial fibrillation: Secondary | ICD-10-CM | POA: Diagnosis present

## 2019-05-21 DIAGNOSIS — Z887 Allergy status to serum and vaccine status: Secondary | ICD-10-CM

## 2019-05-21 DIAGNOSIS — Z833 Family history of diabetes mellitus: Secondary | ICD-10-CM

## 2019-05-21 DIAGNOSIS — Z8551 Personal history of malignant neoplasm of bladder: Secondary | ICD-10-CM

## 2019-05-21 DIAGNOSIS — R1011 Right upper quadrant pain: Secondary | ICD-10-CM

## 2019-05-21 LAB — HEPATIC FUNCTION PANEL
ALT: 236 U/L — ABNORMAL HIGH (ref 0–44)
AST: 204 U/L — ABNORMAL HIGH (ref 15–41)
Albumin: 3.9 g/dL (ref 3.5–5.0)
Alkaline Phosphatase: 94 U/L (ref 38–126)
Bilirubin, Direct: 2.7 mg/dL — ABNORMAL HIGH (ref 0.0–0.2)
Indirect Bilirubin: 4.7 mg/dL — ABNORMAL HIGH (ref 0.3–0.9)
Total Bilirubin: 7.4 mg/dL — ABNORMAL HIGH (ref 0.3–1.2)
Total Protein: 6.7 g/dL (ref 6.5–8.1)

## 2019-05-21 LAB — URINALYSIS, COMPLETE (UACMP) WITH MICROSCOPIC
Bacteria, UA: NONE SEEN
Bilirubin Urine: NEGATIVE
Glucose, UA: 50 mg/dL — AB
Hgb urine dipstick: NEGATIVE
Ketones, ur: NEGATIVE mg/dL
Leukocytes,Ua: NEGATIVE
Nitrite: NEGATIVE
Protein, ur: 30 mg/dL — AB
Specific Gravity, Urine: 1.014 (ref 1.005–1.030)
pH: 6 (ref 5.0–8.0)

## 2019-05-21 LAB — BASIC METABOLIC PANEL
Anion gap: 9 (ref 5–15)
BUN: 11 mg/dL (ref 8–23)
CO2: 22 mmol/L (ref 22–32)
Calcium: 8.7 mg/dL — ABNORMAL LOW (ref 8.9–10.3)
Chloride: 100 mmol/L (ref 98–111)
Creatinine, Ser: 0.78 mg/dL (ref 0.44–1.00)
GFR calc Af Amer: >60 mL/min (ref >60)
GFR calc non Af Amer: >60 mL/min (ref >60)
Glucose, Bld: 161 mg/dL — ABNORMAL HIGH (ref 70–99)
Potassium: 3.8 mmol/L (ref 3.5–5.1)
Sodium: 131 mmol/L — ABNORMAL LOW (ref 135–145)

## 2019-05-21 LAB — CBC
HCT: 41.6 % (ref 36.0–46.0)
Hemoglobin: 14.8 g/dL (ref 12.0–15.0)
MCH: 32.7 pg (ref 26.0–34.0)
MCHC: 35.6 g/dL (ref 30.0–36.0)
MCV: 92 fL (ref 80.0–100.0)
Platelets: 250 10*3/uL (ref 150–400)
RBC: 4.52 MIL/uL (ref 3.87–5.11)
RDW: 12.7 % (ref 11.5–15.5)
WBC: 20.3 10*3/uL — ABNORMAL HIGH (ref 4.0–10.5)
nRBC: 0 % (ref 0.0–0.2)

## 2019-05-21 LAB — TROPONIN I (HIGH SENSITIVITY): Troponin I (High Sensitivity): 8 ng/L (ref <18)

## 2019-05-21 LAB — LIPASE, BLOOD: Lipase: 21 U/L (ref 11–51)

## 2019-05-21 LAB — SARS CORONAVIRUS 2 BY RT PCR (HOSPITAL ORDER, PERFORMED IN ~~LOC~~ HOSPITAL LAB): SARS Coronavirus 2: NEGATIVE

## 2019-05-21 MED ORDER — SODIUM CHLORIDE 0.9 % IV BOLUS
500.0000 mL | Freq: Once | INTRAVENOUS | Status: AC
  Administered 2019-05-21: 500 mL via INTRAVENOUS

## 2019-05-21 MED ORDER — ONDANSETRON HCL 4 MG/2ML IJ SOLN
4.0000 mg | Freq: Once | INTRAMUSCULAR | Status: AC
  Administered 2019-05-21: 19:00:00 4 mg via INTRAVENOUS
  Filled 2019-05-21: qty 2

## 2019-05-21 MED ORDER — SODIUM CHLORIDE 0.9 % IV SOLN
Freq: Once | INTRAVENOUS | Status: AC
  Administered 2019-05-21: via INTRAVENOUS

## 2019-05-21 MED ORDER — SODIUM CHLORIDE 0.9 % IV BOLUS
500.0000 mL | Freq: Once | INTRAVENOUS | Status: AC
  Administered 2019-05-22: 01:00:00 500 mL via INTRAVENOUS

## 2019-05-21 MED ORDER — IOHEXOL 300 MG/ML  SOLN
100.0000 mL | Freq: Once | INTRAMUSCULAR | Status: AC | PRN
  Administered 2019-05-21: 22:00:00 100 mL via INTRAVENOUS

## 2019-05-21 MED ORDER — PIPERACILLIN-TAZOBACTAM 3.375 G IVPB 30 MIN
3.3750 g | Freq: Once | INTRAVENOUS | Status: AC
  Administered 2019-05-22: 01:00:00 3.375 g via INTRAVENOUS
  Filled 2019-05-21: qty 50

## 2019-05-21 NOTE — ED Notes (Signed)
Pt ambulated to toilet without assistance, gait steady 

## 2019-05-21 NOTE — ED Notes (Addendum)
Pt sitting in bed speaking with this RN in NAD, pt A&Ox4. Pt reports episodes of shaking while taking Fosamax. Dr. Quentin Cornwall at bedside

## 2019-05-21 NOTE — ED Notes (Signed)
Report given to [place], Therapist, sports

## 2019-05-21 NOTE — ED Triage Notes (Signed)
Pt states she started on a medication but unsure of the name of the medication; pt states it is for "bone density." Pt states yesterday she took the medication for the first time and feels weak, has been nauseated, had increased gastric reflux, episode of shakes, and nausea. Pt denies any of those symptoms at current time other than weakness and nauseated.

## 2019-05-21 NOTE — ED Provider Notes (Signed)
[facility] Emergency Department Provider Note    First MD Initiated Contact with Patient 05/21/19 2126     (approximate)  I have reviewed the triage vital signs and the nursing notes.   HISTORY  Chief Complaint Weakness    HPI Adriana Anderson is a 78 y.o. female [place] past medical history presents the ER for evaluation of burning chest discomfort x2 days.  States that it kept her up all last night.  Is never had pain like this before.  Denies any shortness of breath.  States it feels like horrible reflux.  Denies any dysuria.  She is status post appendectomy.  States that she is on prophylactic antibiotics for history of recurrent UTIs.    Past Medical History:  Diagnosis Date  . Acid reflux   . Bladder cancer ([place])   . Bladder mass   . Diabetes mellitus, type 2 ([place])   . GERD (gastroesophageal reflux disease)   . History of biopsy of bladder   . History of cystoscopy   . Liver disease   . PONV (postoperative nausea and vomiting)    Family History  Problem Relation Age of Onset  . Bladder Cancer Brother        x 3   . Brain cancer Father   . Kidney disease Father   . Diabetes Other   . Heart attack Mother   . Heart failure Sister   . Bladder Cancer Brother   . Prostate cancer Neg Hx   . Kidney cancer Neg Hx    Past Surgical History:  Procedure Laterality Date  . APPENDECTOMY    . CYSTOSCOPY WITH BIOPSY N/A 11/15/2015   Procedure: CYSTOSCOPY WITH BIOPSY;  Surgeon: [name], MD;  Location: [facility] ORS;  Service: Urology;  Laterality: N/A;  . LEFT HEART CATH AND CORONARY ANGIOGRAPHY Left 02/23/2017   Procedure: Left Heart Cath and Coronary Angiography;  Surgeon: Isaias Cowman, MD;  Location: [facility];  Service: Cardiovascular;  Laterality: Left;  . TOE SURGERY     Patient Active Problem List   Diagnosis Date Noted  . Ischemic bowel disease ([place]) 07/17/2018  . Hypokalemia 03/19/2017  . UTI (urinary tract infection)  03/19/2017  . Atrial fibrillation with RVR ([place]) 03/18/2017  . NSVT (nonsustained ventricular tachycardia) ([place]) 03/18/2017  . Leukocytosis 03/09/2017  . Acute blood loss anemia 03/07/2017  . Acute post-operative pain 03/07/2017  . 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 Coronary atherosclerosis due to lipid rich plaque 03/02/2017  . 2-vessel coronary artery disease 02/27/2017  . Chest pain with high risk for cardiac etiology 01/23/2017  . SOB (shortness of breath) on exertion 01/23/2017  . 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 Malignant neoplasm of urinary bladder ([place]) 12/31/2015  . History of neoplasm of bladder 04/08/2015  . Acid reflux 04/01/2014      Prior to Admission medications   Medication Sig Start Date End Date Taking? Authorizing Provider  apixaban (ELIQUIS) 5 MG TABS tablet Take by mouth. 03/18/17   [provider]  aspirin EC 81 MG tablet Take 81 mg by mouth at bedtime.    [provider]  atorvastatin (LIPITOR) 40 MG tablet Take by mouth. 03/13/17 03/13/18  [provider]  calcium carbonate (TUMS EXTRA STRENGTH 750) 750 MG chewable tablet Chew 2,250-3,750 tablets by mouth at bedtime as needed for heartburn.    [provider]  Calcium Carbonate-Vitamin D (CALTRATE 600+D PO) Take 1 tablet by mouth 2 (two) times  daily.    [provider]  estradiol (ESTRACE VAGINAL) 0.1 MG/GM vaginal cream USE 1 APPLICATION VAGINALLY ONCE A WEEK 06/19/18   McGowan, Larene [place] A, PA-C  loratadine (CLARITIN) 10 MG tablet Take 10 mg by mouth daily as needed for allergies.    [provider]  metoprolol succinate (TOPROL-XL) 25 MG 24 hr tablet TAKE ONE AND ONE-HALF TABLETS TWICE DAILY 03/15/18   [provider]  nitrofurantoin, macrocrystal-monohydrate, (MACROBID) 100 MG capsule Take 1 capsule (100 mg total) by mouth daily. 05/27/18   Bjorn Loser, MD  NITROGLYCERIN ER PO Take by mouth.    [provider]  pantoprazole (PROTONIX) 40  MG tablet Take 40 mg by mouth daily. 04/28/18   [provider]  polyethylene glycol powder (GLYCOLAX/MIRALAX) powder Take 17 g by mouth every other day.     [provider]  ranitidine (ZANTAC) 150 MG tablet Take 150 mg by mouth at bedtime as needed for heartburn.    [provider]  solifenacin (VESICARE) 10 MG tablet Take 1 tablet (10 mg total) by mouth daily. 05/19/19   Billey Co, MD    Allergies Tetanus-diphth-acell pertussis and Sulfa antibiotics    Social History Social History   Tobacco Use  . Smoking status: Former Smoker    Quit date: 04/20/1982    Years since quitting: 37.1  . Smokeless tobacco: Never Used  Substance Use Topics  . Alcohol use: Yes    Alcohol/week: 0.0 standard drinks  . Drug use: No    Review of Systems Patient denies headaches, rhinorrhea, blurry vision, numbness, shortness of breath, chest pain, edema, cough, abdominal pain, nausea, vomiting, diarrhea, dysuria, fevers, rashes or hallucinations unless otherwise stated above in HPI. ____________________________________________   PHYSICAL EXAM:  VITAL SIGNS: Vitals:   05/21/19 2145 05/21/19 2249  BP: (!) 156/65   Pulse: 89   Resp:    Temp:  98.3 F (36.8 C)  SpO2: 95%     Constitutional: Alert and oriented.  Eyes: Conjunctivae are normal.  Head: Atraumatic. Nose: No congestion/rhinnorhea. Mouth/Throat: Mucous membranes are moist.   Neck: No stridor. Painless ROM.  Cardiovascular: Normal rate, regular rhythm. Grossly normal heart sounds.  Good peripheral circulation. Respiratory: Normal respiratory effort.  No retractions. Lungs CTAB. Gastrointestinal: Soft with mild ruq ttp. No distention. No abdominal bruits. No CVA tenderness. Genitourinary:  Musculoskeletal: No lower extremity tenderness nor edema.  No joint effusions. Neurologic:  Normal speech and language. No gross focal neurologic deficits are appreciated. No facial droop Skin:  Skin is warm, dry  and intact. No rash noted. Psychiatric: Mood and affect are normal. Speech and behavior are normal.  ____________________________________________   LABS (all labs ordered are listed, but only abnormal results are displayed)  Results for orders placed or performed during the hospital encounter of 05/21/19 (from the past 24 hour(s))  Basic metabolic panel     Status: Abnormal   Collection Time: 05/21/19  6:32 PM  Result Value Ref Range   Sodium 131 (L) 135 - 145 mmol/L   Potassium 3.8 3.5 - 5.1 mmol/L   Chloride 100 98 - 111 mmol/L   CO2 22 22 - 32 mmol/L   Glucose, Bld 161 (H) 70 - 99 mg/dL   BUN 11 8 - 23 mg/dL   Creatinine, Ser 0.78 0.44 - 1.00 mg/dL   Calcium 8.7 (L) 8.9 - 10.3 mg/dL   GFR calc non Af Amer >60 >60 mL/min   GFR calc Af Amer >60 >60 mL/min  Anion gap 9 5 - 15  CBC     Status: Abnormal   Collection Time: 05/21/19  6:32 PM  Result Value Ref Range   WBC 20.3 (H) 4.0 - 10.5 K/uL   RBC 4.52 3.87 - 5.11 MIL/uL   Hemoglobin 14.8 12.0 - 15.0 g/dL   HCT 41.6 36.0 - 46.0 %   MCV 92.0 80.0 - 100.0 fL   MCH 32.7 26.0 - 34.0 pg   MCHC 35.6 30.0 - 36.0 g/dL   RDW 12.7 11.5 - 15.5 %   Platelets 250 150 - 400 K/uL   nRBC 0.0 0.0 - 0.2 %  Troponin I (High Sensitivity)     Status: None   Collection Time: 05/21/19  6:32 PM  Result Value Ref Range   Troponin I (High Sensitivity) 8 <18 ng/L  Hepatic function panel     Status: Abnormal   Collection Time: 05/21/19  6:32 PM  Result Value Ref Range   Total Protein 6.7 6.5 - 8.1 g/dL   Albumin 3.9 3.5 - 5.0 g/dL   AST 204 (H) 15 - 41 U/L   ALT 236 (H) 0 - 44 U/L   Alkaline Phosphatase 94 38 - 126 U/L   Total Bilirubin 7.4 (H) 0.3 - 1.2 mg/dL   Bilirubin, Direct 2.7 (H) 0.0 - 0.2 mg/dL   Indirect Bilirubin 4.7 (H) 0.3 - 0.9 mg/dL  Lipase, blood     Status: None   Collection Time: 05/21/19  6:32 PM  Result Value Ref Range   Lipase 21 11 - 51 U/L  Urinalysis, Complete w Microscopic     Status: Abnormal   Collection Time:  05/21/19  9:39 PM  Result Value Ref Range   Color, Urine AMBER (A) YELLOW   APPearance CLEAR (A) CLEAR   Specific Gravity, Urine 1.014 1.005 - 1.030   pH 6.0 5.0 - 8.0   Glucose, UA 50 (A) NEGATIVE mg/dL   Hgb urine dipstick NEGATIVE NEGATIVE   Bilirubin Urine NEGATIVE NEGATIVE   Ketones, ur NEGATIVE NEGATIVE mg/dL   Protein, ur 30 (A) NEGATIVE mg/dL   Nitrite NEGATIVE NEGATIVE   Leukocytes,Ua NEGATIVE NEGATIVE   RBC / HPF 0-5 0 - 5 RBC/hpf   WBC, UA 0-5 0 - 5 WBC/hpf   Bacteria, UA NONE SEEN NONE SEEN   Squamous Epithelial / LPF 0-5 0 - 5   Mucus PRESENT   SARS Coronavirus 2 [facility] order, Performed in [place] hospital lab) Nasopharyngeal Nasopharyngeal Swab     Status: None   Collection Time: 05/21/19  9:46 PM   Specimen: Nasopharyngeal Swab  Result Value Ref Range   SARS Coronavirus 2 NEGATIVE NEGATIVE   ____________________________________________  EKG My review and personal interpretation at Time: 18:24   Indication: ruq pain  Rate: 100  Rhythm: sinus Axis: normal Other: nonspecific st abn, no stemi ____________________________________________  RADIOLOGY  I personally reviewed all radiographic images ordered to evaluate for the above acute complaints and reviewed radiology reports and findings.  These findings were personally discussed with the patient.  Please see medical record for radiology report.  ____________________________________________   PROCEDURES  Procedure(s) performed:  Procedures    Critical Care performed: no ____________________________________________   INITIAL IMPRESSION / ASSESSMENT AND PLAN / ED COURSE  Pertinent labs & imaging results that were available during my care of the patient were reviewed by me and considered in my medical decision making (see chart for details).   DDX: Cholelithiasis, cholecystitis, pancreatitis, gastritis, enteritis, ACS, PE, pneumonia, cystitis  AREEBA SULSER is a 78 y.o. who presents to the ED  with symptoms as described above.  Patient well-appearing in no acute distress but blood work does show evidence of leukocytosis.  Given location of pain will add on LFTs.  Will order CT imaging for evaluation above differential.  Will give IV fluids.  Clinical Course as of May 21 2343  Wed May 21, 2019  2340 Discussed case in consultation with Dr. Marius Ditch of GI.  We do have ERCP capabilities this week.  Patient is not overtly septic at this point.  She not febrile not tachycardic and normotensive.  Will give prophylactic antibiotics pending further work-up and right upper quadrant ultrasound.  Patient will require admission.  Patient was signed out to oncoming physician pending results of right upper quadrant ultrasound.   [PR]    Clinical Course User Index [PR] Merlyn Lot, MD    The patient was evaluated in Emergency Department today for the symptoms described in the history of present illness. He/she was evaluated in the context of the global COVID-19 pandemic, which necessitated consideration that the patient might be at risk for infection with the SARS-CoV-2 virus that causes COVID-19. Institutional protocols and algorithms that pertain to the evaluation of patients at risk for COVID-19 are in a state of rapid change based on information released by regulatory bodies including the CDC and federal and state organizations. These policies and algorithms were followed during the patient's care in the ED.  As part of my medical decision making, I reviewed the following data within the [facility] notes reviewed and incorporated, Labs reviewed, notes from prior ED visits and [place] Controlled Substance Database   ____________________________________________   FINAL CLINICAL IMPRESSION(S) / ED DIAGNOSES  Final diagnoses:  RUQ pain  Abnormal LFTs      NEW MEDICATIONS STARTED DURING THIS VISIT:  New Prescriptions   No medications on file     Note:  This  document was prepared using Dragon voice recognition software and may include unintentional dictation errors.    Merlyn Lot, MD 05/21/19 [phone]

## 2019-05-22 ENCOUNTER — Other Ambulatory Visit: Payer: Self-pay

## 2019-05-22 ENCOUNTER — Emergency Department: Payer: Medicare Other

## 2019-05-22 DIAGNOSIS — K819 Cholecystitis, unspecified: Secondary | ICD-10-CM | POA: Diagnosis present

## 2019-05-22 DIAGNOSIS — Z841 Family history of disorders of kidney and ureter: Secondary | ICD-10-CM | POA: Diagnosis not present

## 2019-05-22 DIAGNOSIS — E876 Hypokalemia: Secondary | ICD-10-CM | POA: Diagnosis not present

## 2019-05-22 DIAGNOSIS — I700 Atherosclerosis of aorta: Secondary | ICD-10-CM | POA: Diagnosis present

## 2019-05-22 DIAGNOSIS — E119 Type 2 diabetes mellitus without complications: Secondary | ICD-10-CM | POA: Diagnosis present

## 2019-05-22 DIAGNOSIS — Z7901 Long term (current) use of anticoagulants: Secondary | ICD-10-CM | POA: Diagnosis not present

## 2019-05-22 DIAGNOSIS — K769 Liver disease, unspecified: Secondary | ICD-10-CM | POA: Diagnosis present

## 2019-05-22 DIAGNOSIS — Z8551 Personal history of malignant neoplasm of bladder: Secondary | ICD-10-CM | POA: Diagnosis not present

## 2019-05-22 DIAGNOSIS — Z20828 Contact with and (suspected) exposure to other viral communicable diseases: Secondary | ICD-10-CM | POA: Diagnosis present

## 2019-05-22 DIAGNOSIS — Z8052 Family history of malignant neoplasm of bladder: Secondary | ICD-10-CM | POA: Diagnosis not present

## 2019-05-22 DIAGNOSIS — Z7982 Long term (current) use of aspirin: Secondary | ICD-10-CM | POA: Diagnosis not present

## 2019-05-22 DIAGNOSIS — Z951 Presence of aortocoronary bypass graft: Secondary | ICD-10-CM | POA: Diagnosis not present

## 2019-05-22 DIAGNOSIS — Z87891 Personal history of nicotine dependence: Secondary | ICD-10-CM | POA: Diagnosis not present

## 2019-05-22 DIAGNOSIS — R7401 Elevation of levels of liver transaminase levels: Secondary | ICD-10-CM | POA: Diagnosis present

## 2019-05-22 DIAGNOSIS — Z882 Allergy status to sulfonamides status: Secondary | ICD-10-CM | POA: Diagnosis not present

## 2019-05-22 DIAGNOSIS — E871 Hypo-osmolality and hyponatremia: Secondary | ICD-10-CM | POA: Diagnosis present

## 2019-05-22 DIAGNOSIS — K219 Gastro-esophageal reflux disease without esophagitis: Secondary | ICD-10-CM | POA: Diagnosis present

## 2019-05-22 DIAGNOSIS — Z8249 Family history of ischemic heart disease and other diseases of the circulatory system: Secondary | ICD-10-CM | POA: Diagnosis not present

## 2019-05-22 DIAGNOSIS — I4821 Permanent atrial fibrillation: Secondary | ICD-10-CM | POA: Diagnosis present

## 2019-05-22 DIAGNOSIS — I2510 Atherosclerotic heart disease of native coronary artery without angina pectoris: Secondary | ICD-10-CM | POA: Diagnosis present

## 2019-05-22 DIAGNOSIS — Z833 Family history of diabetes mellitus: Secondary | ICD-10-CM | POA: Diagnosis not present

## 2019-05-22 DIAGNOSIS — K810 Acute cholecystitis: Secondary | ICD-10-CM | POA: Diagnosis present

## 2019-05-22 DIAGNOSIS — R740 Nonspecific elevation of levels of transaminase and lactic acid dehydrogenase [LDH]: Secondary | ICD-10-CM | POA: Diagnosis present

## 2019-05-22 DIAGNOSIS — Z87440 Personal history of urinary (tract) infections: Secondary | ICD-10-CM | POA: Diagnosis not present

## 2019-05-22 DIAGNOSIS — Z79899 Other long term (current) drug therapy: Secondary | ICD-10-CM | POA: Diagnosis not present

## 2019-05-22 DIAGNOSIS — Z887 Allergy status to serum and vaccine status: Secondary | ICD-10-CM | POA: Diagnosis not present

## 2019-05-22 LAB — COMPREHENSIVE METABOLIC PANEL
ALT: 156 U/L — ABNORMAL HIGH (ref 0–44)
AST: 106 U/L — ABNORMAL HIGH (ref 15–41)
Albumin: 3.2 g/dL — ABNORMAL LOW (ref 3.5–5.0)
Alkaline Phosphatase: 89 U/L (ref 38–126)
Anion gap: 7 (ref 5–15)
BUN: 14 mg/dL (ref 8–23)
CO2: 20 mmol/L — ABNORMAL LOW (ref 22–32)
Calcium: 7.9 mg/dL — ABNORMAL LOW (ref 8.9–10.3)
Chloride: 107 mmol/L (ref 98–111)
Creatinine, Ser: 0.82 mg/dL (ref 0.44–1.00)
GFR calc Af Amer: >60 mL/min (ref >60)
GFR calc non Af Amer: >60 mL/min (ref >60)
Glucose, Bld: 105 mg/dL — ABNORMAL HIGH (ref 70–99)
Potassium: 3.4 mmol/L — ABNORMAL LOW (ref 3.5–5.1)
Sodium: 134 mmol/L — ABNORMAL LOW (ref 135–145)
Total Bilirubin: 7.8 mg/dL — ABNORMAL HIGH (ref 0.3–1.2)
Total Protein: 5.8 g/dL — ABNORMAL LOW (ref 6.5–8.1)

## 2019-05-22 LAB — CBC
HCT: 38.5 % (ref 36.0–46.0)
Hemoglobin: 13 g/dL (ref 12.0–15.0)
MCH: 32.7 pg (ref 26.0–34.0)
MCHC: 33.8 g/dL (ref 30.0–36.0)
MCV: 96.7 fL (ref 80.0–100.0)
Platelets: 147 10*3/uL — ABNORMAL LOW (ref 150–400)
RBC: 3.98 MIL/uL (ref 3.87–5.11)
RDW: 12.9 % (ref 11.5–15.5)
WBC: 8.5 10*3/uL (ref 4.0–10.5)
nRBC: 0 % (ref 0.0–0.2)

## 2019-05-22 LAB — GLUCOSE, CAPILLARY
Glucose-Capillary: 118 mg/dL — ABNORMAL HIGH (ref 70–99)
Glucose-Capillary: 108 mg/dL — ABNORMAL HIGH (ref 70–99)
Glucose-Capillary: 108 mg/dL — ABNORMAL HIGH (ref 70–99)

## 2019-05-22 LAB — LACTIC ACID, PLASMA: Lactic Acid, Venous: 1.5 mmol/L (ref 0.5–1.9)

## 2019-05-22 LAB — HEPARIN LEVEL (UNFRACTIONATED): Heparin Unfractionated: 1.15 IU/mL — ABNORMAL HIGH (ref 0.30–0.70)

## 2019-05-22 LAB — HEMOGLOBIN A1C
Hgb A1c MFr Bld: 6 % — ABNORMAL HIGH (ref 4.8–5.6)
Mean Plasma Glucose: 125.5 mg/dL

## 2019-05-22 LAB — APTT: aPTT: 35 seconds (ref 24–36)

## 2019-05-22 MED ORDER — HEPARIN (PORCINE) 25000 UT/250ML-% IV SOLN
1000.0000 [IU]/h | INTRAVENOUS | Status: DC
  Administered 2019-05-22: 18:00:00 1000 [IU]/h via INTRAVENOUS
  Filled 2019-05-22: qty 250

## 2019-05-22 MED ORDER — INSULIN ASPART 100 UNIT/ML ~~LOC~~ SOLN: 0.0000 [IU] | Freq: Every day | SUBCUTANEOUS | Status: DC

## 2019-05-22 MED ORDER — INSULIN ASPART 100 UNIT/ML ~~LOC~~ SOLN
0.0000 [IU] | Freq: Three times a day (TID) | SUBCUTANEOUS | Status: DC
  Administered 2019-05-23 – 2019-05-24 (×2): 1 [IU] via SUBCUTANEOUS
  Filled 2019-05-22 (×2): qty 1

## 2019-05-22 MED ORDER — GADOBUTROL 1 MMOL/ML IV SOLN
6.0000 mL | Freq: Once | INTRAVENOUS | Status: AC | PRN
  Administered 2019-05-22: 05:00:00 6 mL via INTRAVENOUS

## 2019-05-22 MED ORDER — MORPHINE SULFATE (PF) 2 MG/ML IV SOLN
2.0000 mg | INTRAVENOUS | Status: DC | PRN
  Administered 2019-05-24: 2 mg via INTRAVENOUS
  Filled 2019-05-22: qty 1

## 2019-05-22 MED ORDER — LABETALOL HCL 5 MG/ML IV SOLN
5.0000 mg | INTRAVENOUS | Status: DC | PRN
  Administered 2019-05-22 – 2019-05-23 (×2): 5 mg via INTRAVENOUS
  Filled 2019-05-22 (×2): qty 4

## 2019-05-22 MED ORDER — FENTANYL CITRATE (PF) 100 MCG/2ML IJ SOLN
50.0000 ug | Freq: Once | INTRAMUSCULAR | Status: AC
  Administered 2019-05-22: 01:00:00 50 ug via INTRAVENOUS
  Filled 2019-05-22: qty 2

## 2019-05-22 MED ORDER — ONDANSETRON HCL 4 MG/2ML IJ SOLN
4.0000 mg | INTRAMUSCULAR | Status: DC | PRN
  Administered 2019-05-24 (×2): 4 mg via INTRAVENOUS
  Filled 2019-05-22 (×2): qty 2

## 2019-05-22 MED ORDER — ACETAMINOPHEN 325 MG PO TABS
650.0000 mg | ORAL_TABLET | ORAL | Status: DC | PRN
  Administered 2019-05-22 – 2019-05-23 (×4): 650 mg via ORAL
  Filled 2019-05-22 (×4): qty 2

## 2019-05-22 MED ORDER — LORAZEPAM 2 MG/ML IJ SOLN
INTRAMUSCULAR | Status: AC
  Filled 2019-05-22: qty 1

## 2019-05-22 MED ORDER — PIPERACILLIN-TAZOBACTAM 3.375 G IVPB
3.3750 g | Freq: Three times a day (TID) | INTRAVENOUS | Status: DC
  Administered 2019-05-22 – 2019-05-25 (×10): 3.375 g via INTRAVENOUS
  Filled 2019-05-22 (×10): qty 50

## 2019-05-22 MED ORDER — SODIUM CHLORIDE 0.9 % IV SOLN
INTRAVENOUS | Status: DC
  Administered 2019-05-22 – 2019-05-24 (×8): via INTRAVENOUS

## 2019-05-22 MED ORDER — HEPARIN BOLUS VIA INFUSION
3500.0000 [IU] | Freq: Once | INTRAVENOUS | Status: AC
  Administered 2019-05-22: 18:00:00 3500 [IU] via INTRAVENOUS
  Filled 2019-05-22: qty 3500

## 2019-05-22 NOTE — H&P (Signed)
Adriana Anderson is an 78 y.o. female.   Chief Complaint: Upper abdominal pain HPI: The patient with past medical history of bladder cancer in remission, diabetes and acid reflux presents emergency department complaining of abdominal pain.  The patient reports that the pain is in the center of her abdomen and radiates into her chest.  She states it is severe.  The patient admits to some nausea but denies vomiting.  Ultrasound of the patient's right upper quadrant revealed gallstones but normal common bile duct.  Laboratory evaluation was significant for transaminitis.  The patient was started on Zosyn in the emergency department prior to the hospitalist service being called for admission.  Past Medical History:  Diagnosis Date  . Acid reflux   . Bladder cancer ([place])   . Bladder mass   . Diabetes mellitus, type 2 ([place])   . GERD (gastroesophageal reflux disease)   . History of biopsy of bladder   . History of cystoscopy   . Liver disease   . PONV (postoperative nausea and vomiting)     Past Surgical History:  Procedure Laterality Date  . APPENDECTOMY    . CYSTOSCOPY WITH BIOPSY N/A 11/15/2015   Procedure: CYSTOSCOPY WITH BIOPSY;  Surgeon: [name], MD;  Location: [facility] ORS;  Service: Urology;  Laterality: N/A;  . LEFT HEART CATH AND CORONARY ANGIOGRAPHY Left 02/23/2017   Procedure: Left Heart Cath and Coronary Angiography;  Surgeon: Isaias Cowman, MD;  Location: [facility];  Service: Cardiovascular;  Laterality: Left;  . TOE SURGERY      Family History  Problem Relation Age of Onset  . Bladder Cancer Brother        x 3   . Brain cancer Father   . Kidney disease Father   . Diabetes Other   . Heart attack Mother   . Heart failure Sister   . Bladder Cancer Brother   . Prostate cancer Neg Hx   . Kidney cancer Neg Hx    Social History:  reports that she quit smoking about 37 years ago. She has never used smokeless tobacco. She reports current alcohol use. She  reports that she does not use drugs.  Allergies:  Allergies  Allergen Reactions  . Tetanus-Diphth-Acell Pertussis Swelling  . Sulfa Antibiotics Rash    Medications Prior to Admission  Medication Sig Dispense Refill  . apixaban (ELIQUIS) 5 MG TABS tablet Take by mouth.    Marland Kitchen aspirin EC 81 MG tablet Take 81 mg by mouth at bedtime.    Marland Kitchen atorvastatin (LIPITOR) 40 MG tablet Take by mouth.    . calcium carbonate (TUMS EXTRA STRENGTH 750) 750 MG chewable tablet Chew 2,250-3,750 tablets by mouth at bedtime as needed for heartburn.    . Calcium Carbonate-Vitamin D (CALTRATE 600+D PO) Take 1 tablet by mouth 2 (two) times daily.    Marland Kitchen estradiol (ESTRACE VAGINAL) 0.1 MG/GM vaginal cream USE 1 APPLICATION VAGINALLY ONCE A WEEK 42.5 g 3  . loratadine (CLARITIN) 10 MG tablet Take 10 mg by mouth daily as needed for allergies.    . metoprolol succinate (TOPROL-XL) 25 MG 24 hr tablet TAKE ONE AND ONE-HALF TABLETS TWICE DAILY    . nitrofurantoin, macrocrystal-monohydrate, (MACROBID) 100 MG capsule Take 1 capsule (100 mg total) by mouth daily. 30 capsule 11  . NITROGLYCERIN ER PO Take by mouth.    . pantoprazole (PROTONIX) 40 MG tablet Take 40 mg by mouth daily.  11  . polyethylene glycol powder (GLYCOLAX/MIRALAX) powder Take 17 g  by mouth every other day.     . ranitidine (ZANTAC) 150 MG tablet Take 150 mg by mouth at bedtime as needed for heartburn.    . solifenacin (VESICARE) 10 MG tablet Take 1 tablet (10 mg total) by mouth daily. 90 tablet 3    Results for orders placed or performed during the hospital encounter of 05/21/19 (from the past 48 hour(s))  Basic metabolic panel     Status: Abnormal   Collection Time: 05/21/19  6:32 PM  Result Value Ref Range   Sodium 131 (L) 135 - 145 mmol/L   Potassium 3.8 3.5 - 5.1 mmol/L   Chloride 100 98 - 111 mmol/L   CO2 22 22 - 32 mmol/L   Glucose, Bld 161 (H) 70 - 99 mg/dL   BUN 11 8 - 23 mg/dL   Creatinine, Ser 0.78 0.44 - 1.00 mg/dL   Calcium 8.7 (L) 8.9 -  10.3 mg/dL   GFR calc non Af Amer >60 >60 mL/min   GFR calc Af Amer >60 >60 mL/min   Anion gap 9 5 - 15    Comment: Performed at [facility], [address] [place], [place] [address]  CBC     Status: Abnormal   Collection Time: 05/21/19  6:32 PM  Result Value Ref Range   WBC 20.3 (H) 4.0 - 10.5 K/uL   RBC 4.52 3.87 - 5.11 MIL/uL   Hemoglobin 14.8 12.0 - 15.0 g/dL   HCT 41.6 36.0 - 46.0 %   MCV 92.0 80.0 - 100.0 fL   MCH 32.7 26.0 - 34.0 pg   MCHC 35.6 30.0 - 36.0 g/dL   RDW 12.7 11.5 - 15.5 %   Platelets 250 150 - 400 K/uL   nRBC 0.0 0.0 - 0.2 %    Comment: Performed at [facility], [place], [place] [address]  Troponin I (High Sensitivity)     Status: None   Collection Time: 05/21/19  6:32 PM  Result Value Ref Range   Troponin I (High Sensitivity) 8 <18 ng/L    Comment: (NOTE) Elevated high sensitivity troponin I (hsTnI) values and significant  changes across serial measurements may suggest ACS but many other  chronic and acute conditions are known to elevate hsTnI results.  Refer to the "Links" section for chest pain algorithms and additional  guidance. Performed at [facility], [address] [place], [place] [address]   Hepatic function panel     Status: Abnormal   Collection Time: 05/21/19  6:32 PM  Result Value Ref Range   Total Protein 6.7 6.5 - 8.1 g/dL   Albumin 3.9 3.5 - 5.0 g/dL   AST 204 (H) 15 - 41 U/L   ALT 236 (H) 0 - 44 U/L   Alkaline Phosphatase 94 38 - 126 U/L   Total Bilirubin 7.4 (H) 0.3 - 1.2 mg/dL   Bilirubin, Direct 2.7 (H) 0.0 - 0.2 mg/dL   Indirect Bilirubin 4.7 (H) 0.3 - 0.9 mg/dL    Comment: Performed at [facility], [address] [place], [place] [address]  Lipase, blood     Status: None   Collection Time: 05/21/19  6:32 PM  Result Value Ref Range   Lipase 21 11 - 51 U/L    Comment: Performed at [facility], [address] [place],  [address]  Urinalysis,  Complete w Microscopic     Status: Abnormal   Collection Time: 05/21/19  9:39 PM  Result Value Ref  Range   Color, Urine AMBER (A) YELLOW    Comment: BIOCHEMICALS MAY BE AFFECTED BY COLOR   APPearance CLEAR (A) CLEAR   Specific Gravity, Urine 1.014 1.005 - 1.030   pH 6.0 5.0 - 8.0   Glucose, UA 50 (A) NEGATIVE mg/dL   Hgb urine dipstick NEGATIVE NEGATIVE   Bilirubin Urine NEGATIVE NEGATIVE   Ketones, ur NEGATIVE NEGATIVE mg/dL   Protein, ur 30 (A) NEGATIVE mg/dL   Nitrite NEGATIVE NEGATIVE   Leukocytes,Ua NEGATIVE NEGATIVE   RBC / HPF 0-5 0 - 5 RBC/hpf   WBC, UA 0-5 0 - 5 WBC/hpf   Bacteria, UA NONE SEEN NONE SEEN   Squamous Epithelial / LPF 0-5 0 - 5   Mucus PRESENT     Comment: Performed at [facility], [address], [place], [place] [address]  SARS Coronavirus 2 [facility] order, Performed in [facility] hospital lab) Nasopharyngeal Nasopharyngeal Swab     Status: None   Collection Time: 05/21/19  9:46 PM   Specimen: Nasopharyngeal Swab  Result Value Ref Range   SARS Coronavirus 2 NEGATIVE NEGATIVE    Comment: (NOTE) If result is NEGATIVE SARS-CoV-2 target nucleic acids are NOT DETECTED. The SARS-CoV-2 RNA is generally detectable in upper and lower  respiratory specimens during the acute phase of infection. The lowest  concentration of SARS-CoV-2 viral copies this assay can detect is 250  copies / mL. A negative result does not preclude SARS-CoV-2 infection  and should not be used as the sole basis for treatment or other  patient management decisions.  A negative result may occur with  improper specimen collection / handling, submission of specimen other  than nasopharyngeal swab, presence of viral mutation(s) within the  areas targeted by this assay, and inadequate number of viral copies  (<250 copies / mL). A negative result must be combined with clinical  observations, patient history, and epidemiological information. If result is POSITIVE SARS-CoV-2 target  nucleic acids are DETECTED. The SARS-CoV-2 RNA is generally detectable in upper and lower  respiratory specimens dur ing the acute phase of infection.  Positive  results are indicative of active infection with SARS-CoV-2.  Clinical  correlation with patient history and other diagnostic information is  necessary to determine patient infection status.  Positive results do  not rule out bacterial infection or co-infection with other viruses. If result is PRESUMPTIVE POSTIVE SARS-CoV-2 nucleic acids MAY BE PRESENT.   A presumptive positive result was obtained on the submitted specimen  and confirmed on repeat testing.  While 2019 novel coronavirus  (SARS-CoV-2) nucleic acids may be present in the submitted sample  additional confirmatory testing may be necessary for epidemiological  and / or clinical management purposes  to differentiate between  SARS-CoV-2 and other Sarbecovirus currently known to infect humans.  If clinically indicated additional testing with an alternate test  methodology [phone]) is advised. The SARS-CoV-2 RNA is generally  detectable in upper and lower respiratory sp ecimens during the acute  phase of infection. The expected result is Negative. Fact Sheet for Patients:  StrictlyIdeas.no Fact Sheet for Healthcare Providers: BankingDealers.co.za This test is not yet approved or cleared by the [place] FDA and has been authorized for detection and/or diagnosis of SARS-CoV-2 by FDA under an Emergency Use Authorization (EUA).  This EUA will remain in effect (meaning this test can be used) for the duration of the COVID-19 declaration under Section 564(b)(1) of the Act, 21 U.S.C. section 360bbb-3(b)(1), unless the authorization is terminated or revoked  sooner. Performed at [facility], [address] [place], [place] [address]   Lactic acid, plasma     Status: None   Collection Time: 05/22/19  4:30 AM   Result Value Ref Range   Lactic Acid, Venous 1.5 0.5 - 1.9 mmol/L    Comment: Performed at [facility], [address] [place], [place] [address]   No results found.  Review of Systems  Constitutional: Negative for chills and fever.  HENT: Negative for sore throat and tinnitus.   Eyes: Negative for blurred vision and redness.  Respiratory: Negative for cough and shortness of breath.   Cardiovascular: Negative for chest pain, palpitations, orthopnea and PND.  Gastrointestinal: Negative for abdominal pain, diarrhea, nausea and vomiting.  Genitourinary: Negative for dysuria, frequency and urgency.  Musculoskeletal: Negative for joint pain and myalgias.  Skin: Negative for rash.       No lesions  Neurological: Positive for weakness. Negative for speech change and focal weakness.  Endo/Heme/Allergies: Does not bruise/bleed easily.       No temperature intolerance  Psychiatric/Behavioral: Negative for depression and suicidal ideas.    Blood pressure (!) 158/62, pulse (!) 111, temperature 99.7 F (37.6 C), temperature source Oral, resp. rate 20, height 5' 5.5" (1.664 m), weight 68 kg, SpO2 100 %. Physical Exam  Vitals reviewed. Constitutional: She is oriented to person, place, and time. She appears well-developed and well-nourished. No distress.  HENT:  Head: Normocephalic and atraumatic.  Mouth/Throat: Oropharynx is clear and moist.  Eyes: Pupils are equal, round, and reactive to light. Conjunctivae and EOM are normal. No scleral icterus.  Neck: Normal range of motion. Neck supple. No JVD present. No tracheal deviation present. No thyromegaly present.  Cardiovascular: Normal rate, regular rhythm and normal heart sounds. Exam reveals no gallop and no friction rub.  No murmur heard. Respiratory: Effort normal and breath sounds normal.  GI: Soft. Bowel sounds are normal. She exhibits no distension and no mass. There is abdominal tenderness. There is no rebound and no  guarding.  Genitourinary:    Genitourinary Comments: Deferred   Musculoskeletal: Normal range of motion.        General: No edema.  Lymphadenopathy:    She has no cervical adenopathy.  Neurological: She is alert and oriented to person, place, and time. No cranial nerve deficit. She exhibits normal muscle tone.  Skin: Skin is warm and dry. No rash noted. No erythema.  Psychiatric: She has a normal mood and affect. Her behavior is normal. Judgment and thought content normal.     Assessment/Plan This is a 79 year old female admitted for cholecystitis. 1.  Cholecystitis: Right upper quadrant pain with positive Murphy sign and gallstones.  CBD is normal.  Continue Zosyn.  Consult gastroenterology for MRCP.  The patient is n.p.o.  Manage severe pain with IV morphine as needed. 2.  Transaminitis: Possibly secondary to passed gallstone.  Continue to monitor.  Hydrate with intravenous fluid. 3.  Diabetes mellitus type 2: Diet controlled.  Monitor blood sugars 4.  Hyponatremia: Mild; unclear etiology.  No lung involvement, no CHF, no indication of SIADH or DI.  Inquire about surreptitious alcohol use.  Hydrate with normal saline. 5.  DVT prophylaxis: SCDs 6.  GI prophylaxis: None The patient is a full code.  Time spent on admission orders and patient care approximately 45 minutes  Harrie Foreman, MD 05/22/2019, 6:58 AM

## 2019-05-22 NOTE — Progress Notes (Signed)
Pharmacy Antibiotic Note  Adriana Anderson is a 78 y.o. female admitted on 05/21/2019 with IAI.  Pharmacy has been consulted for Zosyn dosing.  Plan: Zosyn 3.375g IV q8h (4 hour infusion).  [place]: positive for cholecystitis with normal CBD  Height: 5' 5.5" (166.4 cm) Weight: 150 lb (68 kg) IBW/kg (Calculated) : 58.15  Temp (24hrs), Avg:99 F (37.2 C), Min:98.3 F (36.8 C), Max:99.7 F (37.6 C)  Recent Labs  Lab 05/21/19 1832 05/22/19 0430  WBC 20.3*  --   CREATININE 0.78  --   LATICACIDVEN  --  1.5    Estimated Creatinine Clearance: 54.1 mL/min (by C-G formula based on SCr of 0.78 mg/dL).    Allergies  Allergen Reactions  . Tetanus-Diphth-Acell Pertussis Swelling  . Sulfa Antibiotics Rash   Antimicrobials this admission: Zosyn 8/19 >>   Dose adjustments this admission:  Microbiology results:  n/a  Thank you for allowing pharmacy to be a part of this patient's care.  Hart Robinsons A 05/22/2019 5:52 AM

## 2019-05-22 NOTE — Progress Notes (Signed)
Per Dr. Sabra Heck note pt has hx if diabetes. Pt stated she doesn't take medications for it at home but has agree for [place] to check her BG while she is here in the hospital and give her insulin coverage if needed according to her sliding scale. Per Dr. Vianne Bulls okay for RN to order insulin scale order set.

## 2019-05-22 NOTE — Progress Notes (Signed)
As per PCP note patient has history of permanent atrial fibrillation and that she is on Eliquis.  Because patient is going for Chole drain by IR tomorrow we will start heparin drip.  Epic texted pharmacist Barbaraann Rondo.

## 2019-05-22 NOTE — Consult Note (Addendum)
ANTICOAGULATION CONSULT NOTE - Initial Consult  Pharmacy Consult for Heparin Indication: Atrial Fibrillation   Patient Measurements: Height: 5' 5.5" (166.4 cm) Weight: 150 lb (68 kg) IBW/kg (Calculated) : 58.15 Heparin Dosing Weight: 68  Vital Signs: Temp: 99.6 F (37.6 C) (08/20 1358) Temp Source: Oral (08/20 1358) BP: 146/57 (08/20 1203) Pulse Rate: 109 (08/20 1203)  Labs: Recent Labs    05/21/19 1832 05/22/19 1329  HGB 14.8 13.0  HCT 41.6 38.5  PLT 250 147*  CREATININE 0.78 0.82  TROPONINIHS 8  --     Estimated Creatinine Clearance: 52.8 mL/min (by C-G formula based on SCr of 0.82 mg/dL).   Medical History: Past Medical History:  Diagnosis Date  . Acid reflux   . Bladder cancer ([place])   . Bladder mass   . Diabetes mellitus, type 2 ([place])   . GERD (gastroesophageal reflux disease)   . History of biopsy of bladder   . History of cystoscopy   . Liver disease   . PONV (postoperative nausea and vomiting)     Medications:  Eliquis at home (last dose ~24 hours ago per RN)  Assessment:   IR evaluation for cholecystostomy tube placement in AM.  H&H wnl, Plt 147 at baseline Other baseline labs ordered but not yet resulted  Goal of Therapy:  APTT 66-102 HL 0.3-0.7 Monitor platelets by anticoagulation protocol: Yes   Plan:  Infuse 3500 units/hr IV bolus x 1 dose. Infuse 1000 units/hr IV continuous. Stop gtt ~4 hours before surgery.  S/w nurse, surgery at 1000 on 8/21. APTT ordered 2 hours after heparin gtt stopped. Will f/u APTT at 0821 0100, and also APTT at 0821 0800.  Gerald Dexter, PharmD Pharmacy Resident  05/22/2019 3:57 PM

## 2019-05-22 NOTE — Consult Note (Signed)
Jonathon Bellows , MD [address], [place], [place], [place], [address] [address], [place], [place], [place], [address] Phone: [phone]  Fax: [phone]  Consultation  Referring Provider:   Dr Vianne Bulls Primary Care Physician:  Baxter Hire, MD Primary Gastroenterologist:  None          Reason for Consultation:      RUQ pain   Date of Admission:  05/21/2019 Date of Consultation:  05/22/2019         HPI:   Adriana Anderson is a 78 y.o. female presented to the ER with abdominal pain , the pain began 2 days back in the epigastrium associated with chills, fever,improved and returned yesterday which brough her to the hospital, non radiating. This morning feels better. On Eloquis.   CT abdomen 05/21/2019 : gall bladder wall thickening with peri cholecystic fluid, gall stones .   RUQ USG: positive murphys sign, gall stones and wall thickening .   MRCP: suspicion for hemochromatosis, gall bladder wall thickening , NO stones in CBD    WCC 20 , T bil 7.4 with direct 2.7, AST,ALT elevated. Lipase normal Past Medical History:  Diagnosis Date   Acid reflux    Bladder cancer (HCC)    Bladder mass    Diabetes mellitus, type 2 (HCC)    GERD (gastroesophageal reflux disease)    History of biopsy of bladder    History of cystoscopy    Liver disease    PONV (postoperative nausea and vomiting)     Past Surgical History:  Procedure Laterality Date   APPENDECTOMY     CYSTOSCOPY WITH BIOPSY N/A 11/15/2015   Procedure: CYSTOSCOPY WITH BIOPSY;  Surgeon: [name], MD;  Location: [facility];  Service: Urology;  Laterality: N/A;   LEFT HEART CATH AND CORONARY ANGIOGRAPHY Left 02/23/2017   Procedure: Left Heart Cath and Coronary Angiography;  Surgeon: Isaias Cowman, MD;  Location: [facility];  Service: Cardiovascular;  Laterality: Left;   TOE SURGERY      Prior to Admission medications   Medication Sig Start Date End Date Taking? Authorizing Provider    apixaban (ELIQUIS) 5 MG TABS tablet Take by mouth. 03/18/17   [provider]  aspirin EC 81 MG tablet Take 81 mg by mouth at bedtime.    [provider]  atorvastatin (LIPITOR) 40 MG tablet Take by mouth. 03/13/17 03/13/18  [provider]  calcium carbonate (TUMS EXTRA STRENGTH 750) 750 MG chewable tablet Chew 2,250-3,750 tablets by mouth at bedtime as needed for heartburn.    [provider]  Calcium Carbonate-Vitamin D (CALTRATE 600+D PO) Take 1 tablet by mouth 2 (two) times daily.    [provider]  estradiol (ESTRACE VAGINAL) 0.1 MG/GM vaginal cream USE 1 APPLICATION VAGINALLY ONCE A WEEK 06/19/18   McGowan, Larene Beach A, PA-C  loratadine (CLARITIN) 10 MG tablet Take 10 mg by mouth daily as needed for allergies.    [provider]  metoprolol succinate (TOPROL-XL) 25 MG 24 hr tablet TAKE ONE AND ONE-HALF TABLETS TWICE DAILY 03/15/18   [provider]  nitrofurantoin, macrocrystal-monohydrate, (MACROBID) 100 MG capsule Take 1 capsule (100 mg total) by mouth daily. 05/27/18   Bjorn Loser, MD  NITROGLYCERIN ER PO Take by mouth.    [provider]  pantoprazole (PROTONIX) 40 MG tablet Take 40 mg by mouth daily. 04/28/18   [provider]  polyethylene glycol powder (GLYCOLAX/MIRALAX) powder Take 17 g by mouth every other day.  [provider]  ranitidine (ZANTAC) 150 MG tablet Take 150 mg by mouth at bedtime as needed for heartburn.    [provider]  solifenacin (VESICARE) 10 MG tablet Take 1 tablet (10 mg total) by mouth daily. 05/19/19   Billey Co, MD    Family History  Problem Relation Age of Onset   Bladder Cancer Brother        x 3    Brain cancer Father    Kidney disease Father    Diabetes Other    Heart attack Mother    Heart failure Sister    Bladder Cancer Brother    Prostate cancer Neg Hx    Kidney cancer Neg Hx      Social History   Tobacco Use   Smoking  status: Former Smoker    Quit date: 04/20/1982    Years since quitting: 37.1   Smokeless tobacco: Never Used  Substance Use Topics   Alcohol use: Yes    Alcohol/week: 0.0 standard drinks   Drug use: No    Allergies as of 05/21/2019 - Review Complete 05/21/2019  Allergen Reaction Noted   Tetanus-diphth-acell pertussis Swelling 04/21/2015   Sulfa antibiotics Rash 04/21/2015    Review of Systems:    All systems reviewed and negative except where noted in HPI.   Physical Exam:  Vital signs in last 24 hours: Temp:  [98.3 F (36.8 C)-99.7 F (37.6 C)] 99.7 F (37.6 C) (08/20 0521) Pulse Rate:  [82-111] 111 (08/20 0521) Resp:  [16-20] 20 (08/20 0521) BP: (156-189)/(53-151) 158/62 (08/20 0521) SpO2:  [95 %-100 %] 100 % (08/20 0521) Weight:  [68 kg] 68 kg (08/19 1826) Last BM Date: 05/21/19 General:   Pleasant, cooperative in NAD Head:  Normocephalic and atraumatic. Eyes:   No icterus.   Conjunctiva pink. PERRLA. Ears:  Normal auditory acuity. Neck:  Supple; no masses or thyroidomegaly Lungs: Respirations even and unlabored. Lungs clear to auscultation bilaterally.   No wheezes, crackles, or rhonchi.  Heart:  Regular rate and rhythm;  Without murmur, clicks, rubs or gallops Abdomen:  Soft, nondistended,RUQ usg, normal bowel sounds. No appreciable masses or hepatomegaly.  No rebound or guarding.  Neurologic:  Alert and oriented x3;  grossly normal neurologically. Skin:  Intact without significant lesions or rashes. Cervical Nodes:  No significant cervical adenopathy. Psych:  Alert and cooperative. Normal affect.  LAB RESULTS: Recent Labs    05/21/19 1832  WBC 20.3*  HGB 14.8  HCT 41.6  PLT 250   BMET Recent Labs    05/21/19 1832  NA 131*  K 3.8  CL 100  CO2 22  GLUCOSE 161*  BUN 11  CREATININE 0.78  CALCIUM 8.7*   LFT Recent Labs    05/21/19 1832  PROT 6.7  ALBUMIN 3.9  AST 204*  ALT 236*  ALKPHOS 94  BILITOT 7.4*  BILIDIR 2.7*  IBILI 4.7*    PT/INR No results for input(s): LABPROT, INR in the last 72 hours.  STUDIES: Mr 3d Recon At Scanner  Result Date: 05/22/2019 CLINICAL DATA:  Abdominal pain. Gallbladder wall thickening with gallstones. EXAM: MRI ABDOMEN WITHOUT AND WITH CONTRAST (INCLUDING MRCP) TECHNIQUE: Multiplanar multisequence MR imaging of the abdomen was performed both before and after the administration of intravenous contrast. Heavily T2-weighted images of the biliary and pancreatic ducts were obtained, and three-dimensional MRCP images were rendered by post processing. CONTRAST:  6 cc Gadavist COMPARISON:  CT scan and ultrasound from 05/21/2019 FINDINGS: Despite efforts by the technologist  and patient, motion artifact is present on today's exam and could not be eliminated. This reduces exam sensitivity and specificity. This is a common result when MRCP is attempted in the inpatient setting where patients are less likely to be able to breath hold and cooperate in controlling motion. Lower chest: Mild scarring peripherally at the left lung base unchanged from the recent CT. Mild cardiomegaly. Prior median sternotomy. Hepatobiliary: There is abnormal gallbladder wall thickening along with small gallstones. No appreciable biliary dilatation. No identified filling defect in the biliary tree to suggest choledocholithiasis. Dropout of signal in the liver on the inphase images favors hemochromatosis. Pancreas:  Unremarkable Spleen:  Unremarkable Adrenals/Urinary Tract:  Unremarkable Stomach/Bowel: Unremarkable Vascular/Lymphatic: Aortoiliac atherosclerotic vascular disease. 1.3 cm porta hepatis lymph node on image 18/3. Other scattered smaller porta hepatis lymph nodes are present. Other:  No supplemental non-categorized findings. Musculoskeletal: Unremarkable IMPRESSION: 1. Gallbladder wall thickening noted with associated gallstones. Appearance raises suspicion for acute cholecystitis given findings on prior exams including sonographic  Murphy sign. 2. No findings of choledocholithiasis. 3. Likely reactive lymph nodes in the porta hepatis. 4. Dropout of signal in the liver on inphase images raising suspicion for hemochromatosis. 5.  Aortic Atherosclerosis (ICD10-I70.0). Electronically Signed   By: Van Clines M.D.   On: 05/22/2019 08:43   Mr Abdomen Mrcp Moise Boring Contast  Result Date: 05/22/2019 CLINICAL DATA:  Abdominal pain. Gallbladder wall thickening with gallstones. EXAM: MRI ABDOMEN WITHOUT AND WITH CONTRAST (INCLUDING MRCP) TECHNIQUE: Multiplanar multisequence MR imaging of the abdomen was performed both before and after the administration of intravenous contrast. Heavily T2-weighted images of the biliary and pancreatic ducts were obtained, and three-dimensional MRCP images were rendered by post processing. CONTRAST:  6 cc Gadavist COMPARISON:  CT scan and ultrasound from 05/21/2019 FINDINGS: Despite efforts by the technologist and patient, motion artifact is present on today's exam and could not be eliminated. This reduces exam sensitivity and specificity. This is a common result when MRCP is attempted in the inpatient setting where patients are less likely to be able to breath hold and cooperate in controlling motion. Lower chest: Mild scarring peripherally at the left lung base unchanged from the recent CT. Mild cardiomegaly. Prior median sternotomy. Hepatobiliary: There is abnormal gallbladder wall thickening along with small gallstones. No appreciable biliary dilatation. No identified filling defect in the biliary tree to suggest choledocholithiasis. Dropout of signal in the liver on the inphase images favors hemochromatosis. Pancreas:  Unremarkable Spleen:  Unremarkable Adrenals/Urinary Tract:  Unremarkable Stomach/Bowel: Unremarkable Vascular/Lymphatic: Aortoiliac atherosclerotic vascular disease. 1.3 cm porta hepatis lymph node on image 18/3. Other scattered smaller porta hepatis lymph nodes are present. Other:  No  supplemental non-categorized findings. Musculoskeletal: Unremarkable IMPRESSION: 1. Gallbladder wall thickening noted with associated gallstones. Appearance raises suspicion for acute cholecystitis given findings on prior exams including sonographic Murphy sign. 2. No findings of choledocholithiasis. 3. Likely reactive lymph nodes in the porta hepatis. 4. Dropout of signal in the liver on inphase images raising suspicion for hemochromatosis. 5.  Aortic Atherosclerosis (ICD10-I70.0). Electronically Signed   By: Van Clines M.D.   On: 05/22/2019 08:43      Impression / Plan:   KANIA REGNIER is a 78 y.o. y/o female has been admitted with acute cholecystitis. No evidence of choledocholithiasis.   Plan  1. Surgery consult  2. GI will sign off please call with questions   I will sign off.  Please call me if any further GI concerns or questions.  We would like  to thank you for the opportunity to participate in the care of Adriana Anderson.  Thank you for involving me in the care of this patient.      LOS: 0 days   Jonathon Bellows, MD  05/22/2019, 10:18 AM

## 2019-05-22 NOTE — Progress Notes (Signed)
Patient is admitted today because of abdominal pain and acute cholecystitis.  Found to have elevated LFTs, today patient white count is elevated as well. 78 year old female with acute cholecystitis with elevated LFTs, surgery consult, keep her n.p.o., with IV fluids, appreciate GI following the patient.

## 2019-05-22 NOTE — Progress Notes (Addendum)
Per MD okay for RN to add NPO order. Pt will have an ERCP later. Will continue to assess and monitor.

## 2019-05-22 NOTE — ED Provider Notes (Signed)
-----------------------------------------   12:39 AM on 05/22/2019 -----------------------------------------   Blood pressure (!) 189/104, pulse 95, temperature 98.3 F (36.8 C), temperature source Oral, resp. rate 16, height 5' 5.5" (1.664 m), weight 68 kg, SpO2 97 %.  Assuming care from Dr. Quentin Cornwall of [facility] is a 78 y.o. female with a chief complaint of Weakness .    Please refer to H&P by previous MD for further details.  The current plan of care is to f/u US.  [place]: positive for cholecystitis with normal CBD  Discussed with surgery Dr. Windell Moment who recommended formal GI evaluation first since Tbili is elevated before surgical management. Spoke with Dr. Marius Ditch who recommended an MRCP followed by ERCP by Dr. Allen Norris in the morning.  Discussed with Dr. Jannifer Franklin will admit patient.    Alfred Levins, [place], MD 05/22/19 [phone]

## 2019-05-22 NOTE — Consult Note (Signed)
[facility] SURGICAL CONSULTATION NOTE (initial) - cpt: 67672   HISTORY OF PRESENT ILLNESS (HPI):  78 y.o. female presented to [facility] ED yesterday for evaluation of abdominal pain. Patient reports about a 2 days history of epigastric abdominal pain which radiated to her back. Pain is sharp and severe in nature. She reports associated nausea and emesis. She did start to have a low grade fever on examination this afternoon. No cough, congestion, SOB, CP. She has noticed darker urine. Pain primarily onset with eating. History of similar less severe pain in the past which spontaneously resolved. Previous abdominal surgeries consistent with appendectomy. 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 Work up in the ED was concerning for leukocytosis to 20K, hyperbilirubinemia to 7.4 (direct 2.7, indirect 4.7), CT concerning for acute cholecystitis with pericholecystitis fluid. She had subsequent MRCP which did not show CBD dilation and again consistent with acute cholecystitis.    Surgery is consulted by gastroenterology physician Dr. Vicente Males, MD in this context for evaluation and management of acute cholecystitis.   PAST MEDICAL HISTORY (PMH):  Past Medical History:  Diagnosis Date  . Acid reflux   . Bladder cancer ([place])   . Bladder mass   . Diabetes mellitus, type 2 ([place])   . GERD (gastroesophageal reflux disease)   . History of biopsy of bladder   . History of cystoscopy   . Liver disease   . PONV (postoperative nausea and vomiting)      PAST SURGICAL HISTORY ([place]):  Past Surgical History:  Procedure Laterality Date  . APPENDECTOMY    . CYSTOSCOPY WITH BIOPSY N/A 11/15/2015   Procedure: CYSTOSCOPY WITH BIOPSY;  Surgeon: [name], MD;  Location: [facility];  Service: Urology;  Laterality: N/A;  . LEFT HEART CATH AND CORONARY ANGIOGRAPHY Left 02/23/2017   Procedure: Left Heart Cath and Coronary Angiography;  Surgeon: Isaias Cowman, MD;   Location: [facility];  Service: Cardiovascular;  Laterality: Left;  . TOE SURGERY       MEDICATIONS:  Prior to Admission medications   Medication Sig Start Date End Date Taking? Authorizing Provider  apixaban (ELIQUIS) 5 MG TABS tablet Take by mouth. 03/18/17   [provider]  aspirin EC 81 MG tablet Take 81 mg by mouth at bedtime.    [provider]  atorvastatin (LIPITOR) 40 MG tablet Take by mouth. 03/13/17 03/13/18  [provider]  calcium carbonate (TUMS EXTRA STRENGTH 750) 750 MG chewable tablet Chew 2,250-3,750 tablets by mouth at bedtime as needed for heartburn.    [provider]  Calcium Carbonate-Vitamin D (CALTRATE 600+D PO) Take 1 tablet by mouth 2 (two) times daily.    [provider]  estradiol (ESTRACE VAGINAL) 0.1 MG/GM vaginal cream USE 1 APPLICATION VAGINALLY ONCE A WEEK 06/19/18   McGowan, Larene Beach A, PA-C  loratadine (CLARITIN) 10 MG tablet Take 10 mg by mouth daily as needed for allergies.    [provider]  metoprolol succinate (TOPROL-XL) 25 MG 24 hr tablet TAKE ONE AND ONE-HALF TABLETS TWICE DAILY 03/15/18   [provider]  nitrofurantoin, macrocrystal-monohydrate, (MACROBID) 100 MG capsule Take 1 capsule (100 mg total) by mouth daily. 05/27/18   Bjorn Loser, MD  NITROGLYCERIN ER PO Take by mouth.    [provider]  pantoprazole (PROTONIX) 40 MG tablet Take 40 mg by mouth daily. 04/28/18   [provider]  polyethylene glycol powder (GLYCOLAX/MIRALAX) powder Take 17 g by mouth  every other day.     [provider]  ranitidine (ZANTAC) 150 MG tablet Take 150 mg by mouth at bedtime as needed for heartburn.    [provider]  solifenacin (VESICARE) 10 MG tablet Take 1 tablet (10 mg total) by mouth daily. 05/19/19   Billey Co, MD     ALLERGIES:  Allergies  Allergen Reactions  . Tetanus-Diphth-Acell Pertussis Swelling  . Sulfa Antibiotics Rash      SOCIAL HISTORY:  Social History   Socioeconomic History  . Marital status: Married    Spouse name: Not on file  . Number of children: Not on file  . Years of education: Not on file  . Highest education level: Not on file  Occupational History  . Not on file  Social Needs  . Financial resource strain: Not on file  . Food insecurity    Worry: Not on file    Inability: Not on file  . Transportation needs    Medical: Not on file    Non-medical: Not on file  Tobacco Use  . Smoking status: Former Smoker    Quit date: 04/20/1982    Years since quitting: 37.1  . Smokeless tobacco: Never Used  Substance and Sexual Activity  . Alcohol use: Yes    Alcohol/week: 0.0 standard drinks  . Drug use: No  . Sexual activity: Not on file  Lifestyle  . Physical activity    Days per week: Not on file    Minutes per session: Not on file  . Stress: Not on file  Relationships  . Social Herbalist on phone: Not on file    Gets together: Not on file    Attends religious service: Not on file    Active member of club or organization: Not on file    Attends meetings of clubs or organizations: Not on file    Relationship status: Not on file  . Intimate partner violence    Fear of current or ex partner: Not on file    Emotionally abused: Not on file    Physically abused: Not on file    Forced sexual activity: Not on file  Other Topics Concern  . Not on file  Social History Narrative  . Not on file     FAMILY HISTORY:  Family History  Problem Relation Age of Onset  . Bladder Cancer Brother        x 3   . Brain cancer Father   . Kidney disease Father   . Diabetes Other   . Heart attack Mother   . Heart failure Sister   . Bladder Cancer Brother   . Prostate cancer Neg Hx   . Kidney cancer Neg Hx       REVIEW OF SYSTEMS:  Review of Systems  Constitutional: Positive for fever. Negative for chills and weight loss.  HENT: Negative for congestion and sore throat.    Respiratory: Negative for cough and shortness of breath.   Cardiovascular: Negative for chest pain and palpitations.  Gastrointestinal: Positive for abdominal pain, nausea and vomiting. Negative for constipation and diarrhea.  Genitourinary: Positive for dysuria. Negative for frequency and urgency.  Neurological: Negative for dizziness and headaches.  All other systems reviewed and are negative.   VITAL SIGNS:  Temp:  [98.3 F (36.8 C)-99.7 F (37.6 C)] 99.7 F (37.6 C) (08/20 0521) Pulse Rate:  [82-111] 111 (08/20 0521) Resp:  [16-20] 20 (08/20 0521) BP: (156-189)/(53-151) 158/62 (08/20 0521) SpO2:  [  95 %-100 %] 100 % (08/20 0521) Weight:  [68 kg] 68 kg (08/19 1826)     Height: 5' 5.5" (166.4 cm) Weight: 68 kg BMI (Calculated): 24.57   INTAKE/OUTPUT:  08/19 0701 - 08/20 0700 In: 7.3 [I.V.:7.3] Out: 0   PHYSICAL EXAM:  Physical Exam Vitals signs and nursing note reviewed.  Constitutional:      General: She is not in acute distress.    Appearance: Normal appearance. She is normal weight. She is not ill-appearing.  HENT:     Head: Normocephalic and atraumatic.  Eyes:     General: No scleral icterus.    Conjunctiva/sclera: Conjunctivae normal.  Cardiovascular:     Rate and Rhythm: Normal rate and regular rhythm.     Pulses: Normal pulses.     Heart sounds: No murmur. No friction rub. No gallop.   Pulmonary:     Effort: Pulmonary effort is normal. No respiratory distress.     Breath sounds: No wheezing or rhonchi.  Abdominal:     General: Abdomen is flat. A surgical scar is present. There is no distension.     Palpations: Abdomen is soft.     Tenderness: There is abdominal tenderness in the right upper quadrant and epigastric area. There is no guarding or rebound. Positive signs include Murphy's sign.  Genitourinary:    Comments: Deferred Musculoskeletal: Normal range of motion.     Right lower leg: No edema.     Left lower leg: No edema.  Skin:    General: Skin is  warm and dry.     Coloration: Skin is not jaundiced or pale.     Findings: No erythema.  Neurological:     General: No focal deficit present.     Mental Status: She is alert and oriented to person, place, and time.  Psychiatric:        Mood and Affect: Mood normal.        Behavior: Behavior normal.      Labs:  CBC Latest Ref Rng & Units 05/21/2019 06/08/2012 06/07/2012  WBC 4.0 - 10.5 K/uL 20.3(H) 5.7 8.2  Hemoglobin 12.0 - 15.0 g/dL 14.8 11.8(L) 12.9  Hematocrit 36.0 - 46.0 % 41.6 32.6(L) 36.5  Platelets 150 - 400 K/uL 250 158 183   CMP Latest Ref Rng & Units 05/21/2019 02/14/2019 11/04/2015  Glucose 70 - 99 mg/dL 161(H) - 96  BUN 8 - 23 mg/dL 11 - 13  Creatinine 0.44 - 1.00 mg/dL 0.78 0.80 0.96  Sodium 135 - 145 mmol/L 131(L) - 136  Potassium 3.5 - 5.1 mmol/L 3.8 - 4.5  Chloride 98 - 111 mmol/L 100 - 103  CO2 22 - 32 mmol/L 22 - 26  Calcium 8.9 - 10.3 mg/dL 8.7(L) - 9.2  Total Protein 6.5 - 8.1 g/dL 6.7 - -  Total Bilirubin 0.3 - 1.2 mg/dL 7.4(H) - -  Alkaline Phos 38 - 126 U/L 94 - -  AST 15 - 41 U/L 204(H) - -  ALT 0 - 44 U/L 236(H) - -     Imaging studies:   CT Abdomen/Pelvis (05/21/2019) personally reviewed which shows cholelithiasis, wall thickening, and pericholecystic fluid. Radiologist report in PACS reviewed.   RUQ [place] (05/22/2019) personally reviewed with gallbladder wall thickening, radiologist report in PACS reviewed  MRCP (05/22/2019) personally reviewed and radiologist report reviewed:  IMPRESSION: 1. Gallbladder wall thickening noted with associated gallstones. Appearance raises suspicion for acute cholecystitis given findings on prior exams including sonographic Murphy sign. 2. No findings of choledocholithiasis.  3. Likely reactive lymph nodes in the porta hepatis. 4. Dropout of signal in the liver on inphase images raising suspicion for hemochromatosis. 5.  Aortic Atherosclerosis (ICD10-I70.0).   Assessment/Plan: (ICD-10's: K81.0) 78 y.o. female with  leukocytosis, abdominal pain concerning for acute cholecystitis and hyperbilirubinemia evaluated by GI and MRCP without evidence of cholecystitis, complicated by history of CAD on Eliquis.    - NPO + IVF  - IV ABx (Zosyn)  - repeat CBC and CMP  - pain control prn; antiemetics prn  - monitor abdominal examination  - Given comorbid conditions significant pericholecystic fluid/swelling recommend IR evaluation for cholecystostomy tube placement  - Patient will benefit from interval cholecystectomy once through this acute event and optimized as an outpatient, which she understands    - Further management per primary team  All of the above findings and recommendations were discussed with the patient and her family, and all of patient's and her family's questions were answered to their expressed satisfaction.  Thank you for the opportunity to participate in this patient's care.   -- Edison Simon, PA-C [facility] 05/22/2019, 11:40 AM [phone] M-F: 7am - 4pm

## 2019-05-22 NOTE — Progress Notes (Signed)
Pt will be having choly drain place tomorrow by radiology. Per Zack PA okay for RN to place order for clear liquids and NPO after midnight.

## 2019-05-23 ENCOUNTER — Encounter: Payer: Self-pay | Admitting: Diagnostic Radiology

## 2019-05-23 ENCOUNTER — Inpatient Hospital Stay
Admit: 2019-05-23 | Discharge: 2019-05-23 | Disposition: A | Discharge status: Home / Self Care | Payer: Medicare Other | Attending: Physician Assistant | Admitting: Physician Assistant

## 2019-05-23 ENCOUNTER — Inpatient Hospital Stay: Payer: Medicare Other

## 2019-05-23 DIAGNOSIS — K810 Acute cholecystitis: Principal | ICD-10-CM

## 2019-05-23 HISTORY — PX: IR PERC CHOLECYSTOSTOMY: IMG2326

## 2019-05-23 LAB — GLUCOSE, CAPILLARY
Glucose-Capillary: 91 mg/dL (ref 70–99)
Glucose-Capillary: 103 mg/dL — ABNORMAL HIGH (ref 70–99)
Glucose-Capillary: 122 mg/dL — ABNORMAL HIGH (ref 70–99)
Glucose-Capillary: 108 mg/dL — ABNORMAL HIGH (ref 70–99)

## 2019-05-23 LAB — CBC
HCT: 37.6 % (ref 36.0–46.0)
Hemoglobin: 12.8 g/dL (ref 12.0–15.0)
MCH: 32.4 pg (ref 26.0–34.0)
MCHC: 34 g/dL (ref 30.0–36.0)
MCV: 95.2 fL (ref 80.0–100.0)
Platelets: 130 10*3/uL — ABNORMAL LOW (ref 150–400)
RBC: 3.95 MIL/uL (ref 3.87–5.11)
RDW: 12.9 % (ref 11.5–15.5)
WBC: 6.4 10*3/uL (ref 4.0–10.5)
nRBC: 0 % (ref 0.0–0.2)

## 2019-05-23 LAB — COMPREHENSIVE METABOLIC PANEL
ALT: 136 U/L — ABNORMAL HIGH (ref 0–44)
AST: 97 U/L — ABNORMAL HIGH (ref 15–41)
Albumin: 3 g/dL — ABNORMAL LOW (ref 3.5–5.0)
Alkaline Phosphatase: 93 U/L (ref 38–126)
Anion gap: 4 — ABNORMAL LOW (ref 5–15)
BUN: 12 mg/dL (ref 8–23)
CO2: 21 mmol/L — ABNORMAL LOW (ref 22–32)
Calcium: 7.8 mg/dL — ABNORMAL LOW (ref 8.9–10.3)
Chloride: 110 mmol/L (ref 98–111)
Creatinine, Ser: 0.75 mg/dL (ref 0.44–1.00)
GFR calc Af Amer: >60 mL/min (ref >60)
GFR calc non Af Amer: >60 mL/min (ref >60)
Glucose, Bld: 90 mg/dL (ref 70–99)
Potassium: 3.6 mmol/L (ref 3.5–5.1)
Sodium: 135 mmol/L (ref 135–145)
Total Bilirubin: 7.8 mg/dL — ABNORMAL HIGH (ref 0.3–1.2)
Total Protein: 5.7 g/dL — ABNORMAL LOW (ref 6.5–8.1)

## 2019-05-23 LAB — APTT
aPTT: >160 seconds (ref 24–36)
aPTT: 36 seconds (ref 24–36)

## 2019-05-23 MED ORDER — VANCOMYCIN HCL 1.5 G IV SOLR
1500.0000 mg | Freq: Once | INTRAVENOUS | Status: AC
  Administered 2019-05-23: 18:00:00 1500 mg via INTRAVENOUS
  Filled 2019-05-23: qty 1500

## 2019-05-23 MED ORDER — MIDAZOLAM HCL 2 MG/2ML IJ SOLN
INTRAMUSCULAR | Status: AC
  Filled 2019-05-23: qty 2

## 2019-05-23 MED ORDER — VANCOMYCIN HCL 1.25 G IV SOLR
1250.0000 mg | INTRAVENOUS | Status: DC
  Administered 2019-05-24 – 2019-05-25 (×2): 1250 mg via INTRAVENOUS
  Filled 2019-05-23 (×3): qty 1250

## 2019-05-23 MED ORDER — HEPARIN (PORCINE) 25000 UT/250ML-% IV SOLN
850.0000 [IU]/h | INTRAVENOUS | Status: AC
  Administered 2019-05-23: 04:00:00 850 [IU]/h via INTRAVENOUS

## 2019-05-23 MED ORDER — FENTANYL CITRATE (PF) 100 MCG/2ML IJ SOLN
INTRAMUSCULAR | Status: AC | PRN
  Administered 2019-05-23 (×3): 50 ug via INTRAVENOUS

## 2019-05-23 MED ORDER — SODIUM CHLORIDE 0.9% FLUSH
5.0000 mL | Freq: Three times a day (TID) | INTRAVENOUS | Status: DC
  Administered 2019-05-23 – 2019-05-25 (×6): 5 mL

## 2019-05-23 MED ORDER — APIXABAN 5 MG PO TABS
5.0000 mg | ORAL_TABLET | Freq: Two times a day (BID) | ORAL | Status: DC
  Administered 2019-05-23 – 2019-05-25 (×4): 5 mg via ORAL
  Filled 2019-05-23 (×4): qty 1

## 2019-05-23 MED ORDER — HYDROMORPHONE HCL 1 MG/ML IJ SOLN
0.5000 mg | Freq: Once | INTRAMUSCULAR | Status: AC
  Administered 2019-05-23: 11:00:00 0.5 mg via INTRAVENOUS

## 2019-05-23 MED ORDER — IODIXANOL 320 MG/ML IV SOLN
50.0000 mL | Freq: Once | INTRAVENOUS | Status: AC | PRN
  Administered 2019-05-23: 10 mL

## 2019-05-23 MED ORDER — SODIUM CHLORIDE 0.9% FLUSH: 5.0000 mL | Freq: Three times a day (TID) | INTRAVENOUS | Status: DC

## 2019-05-23 MED ORDER — FENTANYL CITRATE (PF) 100 MCG/2ML IJ SOLN
INTRAMUSCULAR | Status: AC
  Filled 2019-05-23: qty 2

## 2019-05-23 MED ORDER — MIDAZOLAM HCL 2 MG/2ML IJ SOLN
INTRAMUSCULAR | Status: AC | PRN
  Administered 2019-05-23 (×2): 1 mg via INTRAVENOUS

## 2019-05-23 MED ORDER — HYDROMORPHONE HCL 1 MG/ML IJ SOLN
INTRAMUSCULAR | Status: AC
  Administered 2019-05-23: 11:00:00 0.5 mg via INTRAVENOUS
  Filled 2019-05-23: qty 0.5

## 2019-05-23 NOTE — Progress Notes (Signed)
Patient noted to have fever and tachycardia; MD notified and orders received.  Continue to monitor.

## 2019-05-23 NOTE — Procedures (Signed)
Interventional Radiology Procedure:   Indications: Acute cholecystitis  Procedure: Percutaneous cholecystostomy tube placement  Findings: Distended tender gallbladder.  10 Fr drain placed directly into gallbladder and bloody dark bile removed.  Gallbladder decompressed.  Complications: None     EBL: less than 20 ml  Plan: Follow output and send bile for culture.     Keelie Zemanek R. Anselm Pancoast, MD  Pager: [phone]

## 2019-05-23 NOTE — Progress Notes (Signed)
[facility] SURGICAL PROGRESS NOTE (cpt [phone])  Hospital Day(s): 1.   Post op day(s):  Adriana Anderson   Interval History: Patient seen and examined, no acute events or new complaints overnight. Patient reports that she is actually feeling better this morning. Abdominal pain has improved. No fever, chills, nausea, or emesis. Leukocytosis resolved. Still with hyperbilirubinemia to 7.8. Plan for percutaneous cholecystostomy tube with IR. No acute issues.  Review of Systems:  Constitutional: denies fever, chills  HEENT: denies cough or congestion  Respiratory: denies any shortness of breath  Cardiovascular: denies chest pain or palpitations  Gastrointestinal: denies abdominal pain, N/V, or diarrhea/and bowel function as per interval history Genitourinary: denies burning with urination or urinary frequency    Vital signs in last 24 hours: [min-max] current  Temp:  [98.2 F (36.8 C)-100.8 F (38.2 C)] 99.6 F (37.6 C) (08/21 0405) Pulse Rate:  [97-109] 97 (08/21 0405) Resp:  [15-22] 20 (08/21 0405) BP: (146-189)/(54-64) 164/58 (08/21 0405) SpO2:  [93 %-97 %] 96 % (08/21 0405)     Height: 5' 5.5" (166.4 cm) Weight: 68 kg BMI (Calculated): 24.57   Intake/Output last 2 shifts:  08/20 0701 - 08/21 0700 In: 2355.3 [P.O.:780; I.V.:1506.5; IV Piggyback:68.8] Out: 600 [Urine:600]   Physical Exam:  Constitutional: alert, cooperative and no distress  HENT: normocephalic without obvious abnormality  Eyes: PERRL, EOM's grossly intact and symmetric   Respiratory: breathing non-labored at rest  Cardiovascular: regular rate and sinus rhythm  Gastrointestinal: soft, non-tender, and non-distended, no rebound/guarding Musculoskeletal: UE and LE FROM, no edema or wounds, motor and sensation grossly intact, NT    Labs:  CBC Latest Ref Rng & Units 05/23/2019 05/22/2019 05/21/2019  WBC 4.0 - 10.5 K/uL 6.4 8.5 20.3(H)  Hemoglobin 12.0 - 15.0 g/dL 12.8 13.0 14.8  Hematocrit 36.0 - 46.0 % 37.6 38.5  41.6  Platelets 150 - 400 K/uL 130(L) 147(L) 250   CMP Latest Ref Rng & Units 05/23/2019 05/22/2019 05/21/2019  Glucose 70 - 99 mg/dL 90 105(H) 161(H)  BUN 8 - 23 mg/dL 12 14 11   Creatinine 0.44 - 1.00 mg/dL 0.75 0.82 0.78  Sodium 135 - 145 mmol/L 135 134(L) 131(L)  Potassium 3.5 - 5.1 mmol/L 3.6 3.4(L) 3.8  Chloride 98 - 111 mmol/L 110 107 100  CO2 22 - 32 mmol/L 21(L) 20(L) 22  Calcium 8.9 - 10.3 mg/dL 7.8(L) 7.9(L) 8.7(L)  Total Protein 6.5 - 8.1 g/dL 5.7(L) 5.8(L) 6.7  Total Bilirubin 0.3 - 1.2 mg/dL 7.8(H) 7.8(H) 7.4(H)  Alkaline Phos 38 - 126 U/L 93 89 94  AST 15 - 41 U/L 97(H) 106(H) 204(H)  ALT 0 - 44 U/L 136(H) 156(H) 236(H)     Imaging studies: No new pertinent imaging studies   Assessment/Plan: (ICD-10's: K81.0) 78 y.o. female with acute cholecystitis with plans for cholecystostomy tube placement today by IR, complicated by pertinent comorbidities including history of CAD on Eliquis.   - NPO this morning; okay for CLD after procedure + IVF   - IV ABx (Zosyn)   - pain control prn; antiemetics prn             - monitor abdominal examination             - Given comorbid conditions significant pericholecystic fluid/swelling recommend IR evaluation for cholecystostomy tube placement   - Again, Patient will benefit from interval cholecystectomy once through this acute event and optimized as an outpatient, which she understands    - Monitor LFTs  - HOLD Heparin this  morning  - Further management per primary team   All of the above findings and recommendations were discussed with the patient, patient's family, and the medical team, and all of patient's and family's questions were answered to their expressed satisfaction.  -- Edison Simon, PA-C [facility] 05/23/2019, 7:15 AM [phone] M-F: 7am - 4pm

## 2019-05-23 NOTE — Consult Note (Signed)
Pharmacy Antibiotic Note  Adriana Anderson is a 78 y.o. female admitted on 05/21/2019 with sepsis. She is acute cholecystitis status post cholecystostomy tube placement by interventional radiology today. Pharmacy has been consulted for vancomycin and Zosyn dosing. Leukocytosis has resolved but the patient is spiking fevers now  Plan: 1) continue Zosyn 3.375g IV q8h (4 hour infusion)  2) Vancomycin 1250 mg IV Q 24 hrs following a 1500 mg loading dose  Goal AUC 400-550 T 1/2: 13.8h Css min: 11.8 Expected AUC: 509 SCr used: 0.8 (rounded up) Scr in am  Height: 5' 5.5" (166.4 cm) Weight: 150 lb (68 kg) IBW/kg (Calculated) : 58.15  Temp (24hrs), Avg:99.2 F (37.3 C), Min:93 F (33.9 C), Max:102.7 F (39.3 C)  Recent Labs  Lab 05/21/19 1832 05/22/19 0430 05/22/19 1329 05/23/19 0058  WBC 20.3*  --  8.5 6.4  CREATININE 0.78  --  0.82 0.75  LATICACIDVEN  --  1.5  --   --     Estimated Creatinine Clearance: 54.1 mL/min (by C-G formula based on SCr of 0.75 mg/dL).    Antimicrobials this admission: 8/20 Zosyn >>  8/21 vancomycin >>   Microbiology results: 8/21 WCx: pending 8/21 SARS CoV-2: negative   Thank you for allowing pharmacy to be a part of this patient's care.  Dallie Piles, PharmD 05/23/2019 3:49 PM

## 2019-05-23 NOTE — Consult Note (Signed)
Chief Complaint: Patient was seen in consultation today for percutaneous cholecystostomy tube at the request of Schulz,Zachary R  Referring Physician(s): Schulz,Zachary R  Patient Status: [name] - In-pt  History of Present Illness: Adriana Anderson is a 78 y.o. female with recent diagnosis of acute cholecystitis.   Patient presented with abdominal pain and hyperbilirubinemia.  Patient is feeling better since she has been in the hospital and has less abdominal pain.  Patient has no specific complaints today.  Patient was evaluated by surgery who recommended percutaneous cholecystostomy tube prior to a cholecystectomy.  Bilirubin remains elevated.  Past Medical History:  Diagnosis Date   Acid reflux    Bladder cancer (HCC)    Bladder mass    Diabetes mellitus, type 2 (HCC)    GERD (gastroesophageal reflux disease)    History of biopsy of bladder    History of cystoscopy    Liver disease    PONV (postoperative nausea and vomiting)     Past Surgical History:  Procedure Laterality Date   APPENDECTOMY     CYSTOSCOPY WITH BIOPSY N/A 11/15/2015   Procedure: CYSTOSCOPY WITH BIOPSY;  Surgeon: [name], MD;  Location: [facility];  Service: Urology;  Laterality: N/A;   LEFT HEART CATH AND CORONARY ANGIOGRAPHY Left 02/23/2017   Procedure: Left Heart Cath and Coronary Angiography;  Surgeon: Isaias Cowman, MD;  Location: [facility];  Service: Cardiovascular;  Laterality: Left;   TOE SURGERY      Allergies: Tetanus-diphth-acell pertussis and Sulfa antibiotics  Medications: Prior to Admission medications   Medication Sig Start Date End Date Taking? Authorizing Provider  apixaban (ELIQUIS) 5 MG TABS tablet Take by mouth. 03/18/17   [provider]  aspirin EC 81 MG tablet Take 81 mg by mouth at bedtime.    [provider]  atorvastatin (LIPITOR) 40 MG tablet Take by mouth. 03/13/17 03/13/18  [provider]  calcium carbonate (TUMS  EXTRA STRENGTH 750) 750 MG chewable tablet Chew 2,250-3,750 tablets by mouth at bedtime as needed for heartburn.    [provider]  Calcium Carbonate-Vitamin D (CALTRATE 600+D PO) Take 1 tablet by mouth 2 (two) times daily.    [provider]  estradiol (ESTRACE VAGINAL) 0.1 MG/GM vaginal cream USE 1 APPLICATION VAGINALLY ONCE A WEEK 06/19/18   McGowan, Larene Beach A, PA-C  loratadine (CLARITIN) 10 MG tablet Take 10 mg by mouth daily as needed for allergies.    [provider]  metoprolol succinate (TOPROL-XL) 25 MG 24 hr tablet TAKE ONE AND ONE-HALF TABLETS TWICE DAILY 03/15/18   [provider]  NITROGLYCERIN ER PO Take by mouth.    [provider]  pantoprazole (PROTONIX) 40 MG tablet Take 40 mg by mouth daily. 04/28/18   [provider]  polyethylene glycol powder (GLYCOLAX/MIRALAX) powder Take 17 g by mouth every other day.     [provider]  ranitidine (ZANTAC) 150 MG tablet Take 150 mg by mouth at bedtime as needed for heartburn.    [provider]  solifenacin (VESICARE) 10 MG tablet Take 1 tablet (10 mg total) by mouth daily. 05/19/19   Billey Co, MD     Family History  Problem Relation Age of Onset   Bladder Cancer Brother        x 3    Brain cancer Father    Kidney disease Father    Diabetes Other    Heart attack Mother    Heart failure Sister    Bladder  Cancer Brother    Prostate cancer Neg Hx    Kidney cancer Neg Hx     Social History   Socioeconomic History   Marital status: Married    Spouse name: Not on file   Number of children: Not on file   Years of education: Not on file   Highest education level: Not on file  Occupational History   Not on file  Social Needs   Financial resource strain: Not on file   Food insecurity    Worry: Not on file    Inability: Not on file   Transportation needs    Medical: Not on file    Non-medical: Not on file  Tobacco Use   Smoking  status: Former Smoker    Quit date: 04/20/1982    Years since quitting: 37.1   Smokeless tobacco: Never Used  Substance and Sexual Activity   Alcohol use: Yes    Alcohol/week: 0.0 standard drinks   Drug use: No   Sexual activity: Not on file  Lifestyle   Physical activity    Days per week: Not on file    Minutes per session: Not on file   Stress: Not on file  Relationships   Social connections    Talks on phone: Not on file    Gets together: Not on file    Attends religious service: Not on file    Active member of club or organization: Not on file    Attends meetings of clubs or organizations: Not on file    Relationship status: Not on file  Other Topics Concern   Not on file  Social History Narrative   Not on file     Review of Systems  Constitutional: Positive for fatigue. Negative for chills.  Respiratory: Negative.   Cardiovascular: Negative.   Gastrointestinal: Positive for abdominal pain.  Genitourinary: Negative.     Vital Signs: BP (!) 146/63    Pulse 95    Temp 99.2 F (37.3 C)    Resp 17    Ht 5' 5.5" (1.664 m)    Wt 68 kg    SpO2 98%    BMI 24.57 kg/m   Physical Exam Constitutional:      General: She is not in acute distress. Cardiovascular:     Rate and Rhythm: Normal rate and regular rhythm.  Pulmonary:     Effort: Pulmonary effort is normal.     Breath sounds: Normal breath sounds.  Abdominal:     General: Abdomen is flat.     Palpations: Abdomen is soft.     Tenderness: There is abdominal tenderness.     Comments: Mild tenderness in the right upper quadrant.  Musculoskeletal:     Right lower leg: No edema.     Left lower leg: No edema.  Neurological:     Mental Status: She is alert.     Imaging: Ct Abdomen Pelvis W Contrast  Result Date: 05/23/2019 CLINICAL DATA:  Abdominal pain EXAM: CT abdomen and pelvis with intravenous contrast TECHNIQUE: Routine imaging was performed through the abdomen and pelvis following the  administration of intravenous contrast CONTRAST:  100 cc Omnipaque 300 IV COMPARISON:  02/14/2019 FINDINGS: Lung bases: Cardiomegaly. No acute abnormality. Scarring at the left base. Hepatobiliary: There is gallbladder wall thickening or pericholecystic fluid. Small gallstones within the gallbladder. No biliary ductal dilatation or focal hepatic abnormality. Pancreas: No focal abnormality or ductal dilatation. Spleen: No focal abnormality.  Normal size. Adrenal/urinary tract: No hydronephrosis. No suspicious  renal or adrenal lesion. Urinary bladder grossly unremarkable. Stomach/bowel: No evidence of bowel obstruction. Stomach, large and small bowel grossly unremarkable. Vascular: Diffuse aortoiliac atherosclerosis. No aneurysm or adenopathy. Other: No free fluid or free air. Musculoskeletal: Sclerotic area within the left sacrum is stable since prior study. No acute bony abnormality. IMPRESSION: There is gallbladder wall thickening and/or pericholecystic fluid. Gallstones seen within the gallbladder. Consider further evaluation with right upper quadrant ultrasound. Electronically Signed   By: Rolm Baptise M.D.   On: 05/22/2019 16:13   Mr 3d Recon At Scanner  Result Date: 05/22/2019 CLINICAL DATA:  Abdominal pain. Gallbladder wall thickening with gallstones. EXAM: MRI ABDOMEN WITHOUT AND WITH CONTRAST (INCLUDING MRCP) TECHNIQUE: Multiplanar multisequence MR imaging of the abdomen was performed both before and after the administration of intravenous contrast. Heavily T2-weighted images of the biliary and pancreatic ducts were obtained, and three-dimensional MRCP images were rendered by post processing. CONTRAST:  6 cc Gadavist COMPARISON:  CT scan and ultrasound from 05/21/2019 FINDINGS: Despite efforts by the technologist and patient, motion artifact is present on today's exam and could not be eliminated. This reduces exam sensitivity and specificity. This is a common result when MRCP is attempted in the  inpatient setting where patients are less likely to be able to breath hold and cooperate in controlling motion. Lower chest: Mild scarring peripherally at the left lung base unchanged from the recent CT. Mild cardiomegaly. Prior median sternotomy. Hepatobiliary: There is abnormal gallbladder wall thickening along with small gallstones. No appreciable biliary dilatation. No identified filling defect in the biliary tree to suggest choledocholithiasis. Dropout of signal in the liver on the inphase images favors hemochromatosis. Pancreas:  Unremarkable Spleen:  Unremarkable Adrenals/Urinary Tract:  Unremarkable Stomach/Bowel: Unremarkable Vascular/Lymphatic: Aortoiliac atherosclerotic vascular disease. 1.3 cm porta hepatis lymph node on image 18/3. Other scattered smaller porta hepatis lymph nodes are present. Other:  No supplemental non-categorized findings. Musculoskeletal: Unremarkable IMPRESSION: 1. Gallbladder wall thickening noted with associated gallstones. Appearance raises suspicion for acute cholecystitis given findings on prior exams including sonographic Murphy sign. 2. No findings of choledocholithiasis. 3. Likely reactive lymph nodes in the porta hepatis. 4. Dropout of signal in the liver on inphase images raising suspicion for hemochromatosis. 5.  Aortic Atherosclerosis (ICD10-I70.0). Electronically Signed   By: Van Clines M.D.   On: 05/22/2019 08:43   Mr Abdomen Mrcp Moise Boring Contast  Result Date: 05/22/2019 CLINICAL DATA:  Abdominal pain. Gallbladder wall thickening with gallstones. EXAM: MRI ABDOMEN WITHOUT AND WITH CONTRAST (INCLUDING MRCP) TECHNIQUE: Multiplanar multisequence MR imaging of the abdomen was performed both before and after the administration of intravenous contrast. Heavily T2-weighted images of the biliary and pancreatic ducts were obtained, and three-dimensional MRCP images were rendered by post processing. CONTRAST:  6 cc Gadavist COMPARISON:  CT scan and ultrasound from  05/21/2019 FINDINGS: Despite efforts by the technologist and patient, motion artifact is present on today's exam and could not be eliminated. This reduces exam sensitivity and specificity. This is a common result when MRCP is attempted in the inpatient setting where patients are less likely to be able to breath hold and cooperate in controlling motion. Lower chest: Mild scarring peripherally at the left lung base unchanged from the recent CT. Mild cardiomegaly. Prior median sternotomy. Hepatobiliary: There is abnormal gallbladder wall thickening along with small gallstones. No appreciable biliary dilatation. No identified filling defect in the biliary tree to suggest choledocholithiasis. Dropout of signal in the liver on the inphase images favors hemochromatosis. Pancreas:  Unremarkable Spleen:  Unremarkable Adrenals/Urinary Tract:  Unremarkable Stomach/Bowel: Unremarkable Vascular/Lymphatic: Aortoiliac atherosclerotic vascular disease. 1.3 cm porta hepatis lymph node on image 18/3. Other scattered smaller porta hepatis lymph nodes are present. Other:  No supplemental non-categorized findings. Musculoskeletal: Unremarkable IMPRESSION: 1. Gallbladder wall thickening noted with associated gallstones. Appearance raises suspicion for acute cholecystitis given findings on prior exams including sonographic Murphy sign. 2. No findings of choledocholithiasis. 3. Likely reactive lymph nodes in the porta hepatis. 4. Dropout of signal in the liver on inphase images raising suspicion for hemochromatosis. 5.  Aortic Atherosclerosis (ICD10-I70.0). Electronically Signed   By: Van Clines M.D.   On: 05/22/2019 08:43   US Abdomen Limited Ruq  Result Date: 05/22/2019 CLINICAL DATA:  Right upper quadrant pain. Gallbladder wall thickening on CT. EXAM: ULTRASOUND ABDOMEN LIMITED RIGHT UPPER QUADRANT COMPARISON:  Abdominal CT earlier this day. FINDINGS: Gallbladder: Distended with gallstones and sludge. Gallbladder wall  thickening up to 10 mm. Positive sonographic Murphy sign noted by sonographer. Common bile duct: Diameter: 4 mm, normal. Liver: No focal lesion identified. Within normal limits in parenchymal echogenicity. Portal vein is patent on color Doppler imaging with normal direction of blood flow towards the liver. Other: None. IMPRESSION: 1. Sonographic findings suspicious for acute cholecystitis. Gallstones, wall thickening, and positive sonographic Murphy sign. 2. Normal common bile duct. Electronically Signed   By: Keith Rake M.D.   On: 05/22/2019 20:11    Labs:  CBC: Recent Labs    05/21/19 1832 05/22/19 1329 05/23/19 0058  WBC 20.3* 8.5 6.4  HGB 14.8 13.0 12.8  HCT 41.6 38.5 37.6  PLT 250 147* 130*    COAGS: Recent Labs    05/22/19 1547 05/23/19 0058 05/23/19 0916  APTT 35 >160* 36    BMP: Recent Labs    02/14/19 1344 05/21/19 1832 05/22/19 1329 05/23/19 0058  NA  --  131* 134* 135  K  --  3.8 3.4* 3.6  CL  --  100 107 110  CO2  --  22 20* 21*  GLUCOSE  --  161* 105* 90  BUN  --  11 14 12   CALCIUM  --  8.7* 7.9* 7.8*  CREATININE 0.80 0.78 0.82 0.75  GFRNONAA  --  >60 >60 >60  GFRAA  --  >60 >60 >60    LIVER FUNCTION TESTS: Recent Labs    05/21/19 1832 05/22/19 1329 05/23/19 0058  BILITOT 7.4* 7.8* 7.8*  AST 204* 106* 97*  ALT 236* 156* 136*  ALKPHOS 94 89 93  PROT 6.7 5.8* 5.7*  ALBUMIN 3.9 3.2* 3.0*    TUMOR MARKERS: No results for input(s): AFPTM, CEA, CA199, CHROMGRNA in the last 8760 hours.  Assessment and Plan:  78 year old with hyperbilirubinemia and imaging findings that are suggestive for acute cholecystitis.  Patient was evaluated by surgery who recommends a cholecystostomy tube prior to cholecystectomy.  I reviewed the patient's imaging and the findings are suggestive for cholecystitis.  Patient is a candidate for percutaneous cholecystostomy tube with moderate sedation.  Risks and benefits discussed with the patient including, but not  limited to bleeding, infection, gallbladder perforation, bile leak, sepsis or even death.  All of the patient's questions were answered, patient is agreeable to proceed. Consent signed and in chart.  Thank you for this interesting consult.  I greatly enjoyed meeting Adriana Anderson and look forward to participating in their care.  A copy of this report was sent to the requesting provider on this date.  Electronically Signed: Burman Riis, MD 05/23/2019,  10:04 AM   I spent a total of 20 Minutes    in face to face in clinical consultation, greater than 50% of which was counseling/coordinating care for acute cholecystitis.

## 2019-05-23 NOTE — Consult Note (Signed)
ANTICOAGULATION CONSULT NOTE - follow up  Pharmacy Consult for Heparin Indication: Atrial Fibrillation  Patient Measurements: Height: 5' 5.5" (166.4 cm) Weight: 150 lb (68 kg) IBW/kg (Calculated) : 58.15 Heparin Dosing Weight: 68  Vital Signs: Temp: 98.2 F (36.8 C) (08/20 2151) Temp Source: Oral (08/20 2151) BP: 147/54 (08/20 2151) Pulse Rate: 100 (08/20 2151)  Labs: Recent Labs    05/21/19 1832 05/22/19 1329 05/22/19 1547 05/23/19 0058  HGB 14.8 13.0  --  12.8  HCT 41.6 38.5  --  37.6  PLT 250 147*  --  130*  APTT  --   --  35 >160*  HEPARINUNFRC  --   --  1.15*  --   CREATININE 0.78 0.82  --  0.75  TROPONINIHS 8  --   --   --    Estimated Creatinine Clearance: 54.1 mL/min (by C-G formula based on SCr of 0.75 mg/dL).   Medical History: Past Medical History:  Diagnosis Date  . Acid reflux   . Bladder cancer ([place])   . Bladder mass   . Diabetes mellitus, type 2 ([place])   . GERD (gastroesophageal reflux disease)   . History of biopsy of bladder   . History of cystoscopy   . Liver disease   . PONV (postoperative nausea and vomiting)     Medications:  Eliquis at home (last dose ~24 hours ago per RN)  Assessment:   IR evaluation for cholecystostomy tube placement in AM.  H&H wnl, Plt 147 at baseline Other baseline labs ordered but not yet resulted  0821 @ 0058 aPTT > 160 (supratherapeutic), confirmed w/ lab that specimen was drawn in opposite arm from infusion  Goal of Therapy:  APTT 66-102 HL 0.3-0.7 Monitor platelets by anticoagulation protocol: Yes   Plan:  Hold Heparin x 1 hour then resume infusion at 850 units/hr Stop gtt ~4 hours before surgery (6am).  S/w nurse, surgery at 1000 on 8/21. APTT ordered 2 hours after heparin gtt stopped. Will f/u APTT at 0821 0800 (as noted above)  Ena Dawley, PharmD 05/23/2019 2:56 AM

## 2019-05-23 NOTE — Progress Notes (Signed)
[facility] NAME: Adriana Anderson    MR#:  [id number]  DATE OF BIRTH:  02-Apr-1941  SUBJECTIVE: Patient is seen at bedside, admitted for acute cholecystitis status post cholecystostomy tube by interventional radiologist this afternoon.  CHIEF COMPLAINT:   Chief Complaint  Patient presents with  . Weakness   No abdominal pain. REVIEW OF SYSTEMS:   ROS CONSTITUTIONAL: No fever, fatigue or weakness.  EYES: No blurred or double vision.  EARS, NOSE, AND THROAT: No tinnitus or ear pain.  RESPIRATORY: No cough, shortness of breath, wheezing or hemoptysis.  CARDIOVASCULAR: No chest pain, orthopnea, edema.  GASTROINTESTINAL: No nausea, vomiting, diarrhea or abdominal pain.  GENITOURINARY: No dysuria, hematuria.  ENDOCRINE: No polyuria, nocturia,  HEMATOLOGY: No anemia, easy bruising or bleeding SKIN: No rash or lesion. MUSCULOSKELETAL: No joint pain or arthritis.   NEUROLOGIC: No tingling, numbness, weakness.  PSYCHIATRY: No anxiety or depression.   DRUG ALLERGIES:   Allergies  Allergen Reactions  . Tetanus-Diphth-Acell Pertussis Swelling  . Sulfa Antibiotics Rash    VITALS:  Blood pressure (!) 171/59, pulse 99, temperature 98.8 F (37.1 C), temperature source Oral, resp. rate 18, height 5' 5.5" (1.664 m), weight 68 kg, SpO2 91 %.  PHYSICAL EXAMINATION:  GENERAL:  78 y.o.-year-old patient lying in the bed with no acute distress.  EYES: Pupils equal, round, reactive to light and accommodation. No scleral icterus. Extraocular muscles intact.  HEENT: Head atraumatic, normocephalic. Oropharynx and nasopharynx clear.  NECK:  Supple, no jugular venous distention. No thyroid enlargement, no tenderness.  LUNGS: Normal breath sounds bilaterally, no wheezing, rales,rhonchi or crepitation. No use of accessory muscles of respiration.  CARDIOVASCULAR: S1, S2 normal. No murmurs, rubs, or gallops.  ABDOMEN: Soft, nontender,  nondistended. Bowel sounds present. No organomegaly or mass cholecystostomy tube present.  EXTREMITIES: No pedal edema, cyanosis, or clubbing.  NEUROLOGIC: Cranial nerves II through XII are intact. Muscle strength 5/5 in all extremities. Sensation intact. Gait not checked.  PSYCHIATRIC: The patient is alert and oriented x 3.  SKIN: No obvious rash, lesion, or ulcer.    LABORATORY PANEL:   CBC Recent Labs  Lab 05/23/19 0058  WBC 6.4  HGB 12.8  HCT 37.6  PLT 130*   ------------------------------------------------------------------------------------------------------------------  Chemistries  Recent Labs  Lab 05/23/19 0058  NA 135  K 3.6  CL 110  CO2 21*  GLUCOSE 90  BUN 12  CREATININE 0.75  CALCIUM 7.8*  AST 97*  ALT 136*  ALKPHOS 93  BILITOT 7.8*   ------------------------------------------------------------------------------------------------------------------  Cardiac Enzymes No results for input(s): TROPONINI in the last 168 hours. ------------------------------------------------------------------------------------------------------------------  RADIOLOGY:  Ct Abdomen Pelvis W Contrast  Result Date: 05/23/2019 CLINICAL DATA:  Abdominal pain EXAM: CT abdomen and pelvis with intravenous contrast TECHNIQUE: Routine imaging was performed through the abdomen and pelvis following the administration of intravenous contrast CONTRAST:  100 cc Omnipaque 300 IV COMPARISON:  02/14/2019 FINDINGS: Lung bases: Cardiomegaly. No acute abnormality. Scarring at the left base. Hepatobiliary: There is gallbladder wall thickening or pericholecystic fluid. Small gallstones within the gallbladder. No biliary ductal dilatation or focal hepatic abnormality. Pancreas: No focal abnormality or ductal dilatation. Spleen: No focal abnormality.  Normal size. Adrenal/urinary tract: No hydronephrosis. No suspicious renal or adrenal lesion. Urinary bladder grossly unremarkable. Stomach/bowel: No  evidence of bowel obstruction. Stomach, large and small bowel grossly unremarkable. Vascular: Diffuse aortoiliac atherosclerosis. No aneurysm or adenopathy. Other: No free fluid or free air. Musculoskeletal: Sclerotic area within the left  sacrum is stable since prior study. No acute bony abnormality. IMPRESSION: There is gallbladder wall thickening and/or pericholecystic fluid. Gallstones seen within the gallbladder. Consider further evaluation with right upper quadrant ultrasound. Electronically Signed   By: Rolm Baptise M.D.   On: 05/22/2019 16:13   Mr 3d Recon At Scanner  Result Date: 05/22/2019 CLINICAL DATA:  Abdominal pain. Gallbladder wall thickening with gallstones. EXAM: MRI ABDOMEN WITHOUT AND WITH CONTRAST (INCLUDING MRCP) TECHNIQUE: Multiplanar multisequence MR imaging of the abdomen was performed both before and after the administration of intravenous contrast. Heavily T2-weighted images of the biliary and pancreatic ducts were obtained, and three-dimensional MRCP images were rendered by post processing. CONTRAST:  6 cc Gadavist COMPARISON:  CT scan and ultrasound from 05/21/2019 FINDINGS: Despite efforts by the technologist and patient, motion artifact is present on today's exam and could not be eliminated. This reduces exam sensitivity and specificity. This is a common result when MRCP is attempted in the inpatient setting where patients are less likely to be able to breath hold and cooperate in controlling motion. Lower chest: Mild scarring peripherally at the left lung base unchanged from the recent CT. Mild cardiomegaly. Prior median sternotomy. Hepatobiliary: There is abnormal gallbladder wall thickening along with small gallstones. No appreciable biliary dilatation. No identified filling defect in the biliary tree to suggest choledocholithiasis. Dropout of signal in the liver on the inphase images favors hemochromatosis. Pancreas:  Unremarkable Spleen:  Unremarkable Adrenals/Urinary Tract:   Unremarkable Stomach/Bowel: Unremarkable Vascular/Lymphatic: Aortoiliac atherosclerotic vascular disease. 1.3 cm porta hepatis lymph node on image 18/3. Other scattered smaller porta hepatis lymph nodes are present. Other:  No supplemental non-categorized findings. Musculoskeletal: Unremarkable IMPRESSION: 1. Gallbladder wall thickening noted with associated gallstones. Appearance raises suspicion for acute cholecystitis given findings on prior exams including sonographic Murphy sign. 2. No findings of choledocholithiasis. 3. Likely reactive lymph nodes in the porta hepatis. 4. Dropout of signal in the liver on inphase images raising suspicion for hemochromatosis. 5.  Aortic Atherosclerosis (ICD10-I70.0). Electronically Signed   By: Van Clines M.D.   On: 05/22/2019 08:43   Ir Perc Cholecystostomy  Result Date: 05/23/2019 INDICATION: 78 year old with hyperbilirubinemia and acute cholecystitis. EXAM: PERCUTANEOUS CHOLECYSTOSTOMY TUBE WITH ULTRASOUND AND FLUOROSCOPIC GUIDANCE MEDICATIONS: Inpatient receiving IV antibiotics. ANESTHESIA/SEDATION: Moderate (conscious) sedation was employed during this procedure. A total of Versed 2.0 mg and Fentanyl 150 mcg was administered intravenously. Moderate Sedation Time: 20 minutes. The patient's level of consciousness and vital signs were monitored continuously by radiology nursing throughout the procedure under my direct supervision. FLUOROSCOPY TIME:  Fluoroscopy Time: 1 minutes and 48 seconds, 13 mGy COMPLICATIONS: None immediate. PROCEDURE: Informed written consent was obtained from the patient after a thorough discussion of the procedural risks, benefits and alternatives. All questions were addressed. Maximal Sterile Barrier Technique was utilized including caps, mask, sterile gowns, sterile gloves, sterile drape, hand hygiene and skin antiseptic. A timeout was performed prior to the initiation of the procedure. Ultrasound was used to identify the gallbladder.  The right anterior abdomen was prepped and draped in sterile fashion. Skin was anesthetized with 1% lidocaine. Using ultrasound guidance, a 21 gauge needle was directed into the distended gallbladder. 0.018 wire was advanced into the gallbladder. Transitional dilator set was placed. At this point, it was apparent that the transitional dilator had popped out of the gallbladder. Therefore, a 21 gauge needle was again directed into the gallbladder with ultrasound guidance. 0.018 wire was placed. Transitional dilator set was placed and confirmed within the gallbladder. Stiff Amplatz  wire was placed. Tract was dilated to accommodate a 10.2 [place] multipurpose drain. Bloody dark bile was aspirated. The entire gallbladder was decompressed based on ultrasound. Small amount of contrast was also injected under fluoroscopy. Catheter was sutured to skin and attached to a gravity bag. Fluoroscopic and ultrasound images were taken and saved for documentation. FINDINGS: Distended gallbladder. Percutaneous access was obtained directly into the gallbladder without traversing the liver. 10.2 French drain placed in gallbladder and bloody dark bile was removed. Gallbladder was decompressed at the end of the procedure. IMPRESSION: Successful percutaneous cholecystostomy tube placement with ultrasound and fluoroscopic guidance. Bile specimen was sent for culture. Electronically Signed   By: Markus Daft M.D.   On: 05/23/2019 12:21   Dg Chest Portable 1 View  Result Date: 05/23/2019 CLINICAL DATA:  Weakness and nausea. EXAM: PORTABLE CHEST 1 VIEW COMPARISON:  None. FINDINGS: Minimal linear atelectasis or scar in the left lower lung zone is noted. Lungs otherwise clear. The patient is status post CABG. Heart size is upper normal. Aortic atherosclerosis is seen. No pneumothorax or pleural fluid. No acute or focal bony abnormality. IMPRESSION: No acute disease. Atherosclerosis. Electronically Signed   By: Inge Rise M.D.   On:  05/23/2019 11:22   Mr Abdomen Mrcp Moise Boring Contast  Result Date: 05/22/2019 CLINICAL DATA:  Abdominal pain. Gallbladder wall thickening with gallstones. EXAM: MRI ABDOMEN WITHOUT AND WITH CONTRAST (INCLUDING MRCP) TECHNIQUE: Multiplanar multisequence MR imaging of the abdomen was performed both before and after the administration of intravenous contrast. Heavily T2-weighted images of the biliary and pancreatic ducts were obtained, and three-dimensional MRCP images were rendered by post processing. CONTRAST:  6 cc Gadavist COMPARISON:  CT scan and ultrasound from 05/21/2019 FINDINGS: Despite efforts by the technologist and patient, motion artifact is present on today's exam and could not be eliminated. This reduces exam sensitivity and specificity. This is a common result when MRCP is attempted in the inpatient setting where patients are less likely to be able to breath hold and cooperate in controlling motion. Lower chest: Mild scarring peripherally at the left lung base unchanged from the recent CT. Mild cardiomegaly. Prior median sternotomy. Hepatobiliary: There is abnormal gallbladder wall thickening along with small gallstones. No appreciable biliary dilatation. No identified filling defect in the biliary tree to suggest choledocholithiasis. Dropout of signal in the liver on the inphase images favors hemochromatosis. Pancreas:  Unremarkable Spleen:  Unremarkable Adrenals/Urinary Tract:  Unremarkable Stomach/Bowel: Unremarkable Vascular/Lymphatic: Aortoiliac atherosclerotic vascular disease. 1.3 cm porta hepatis lymph node on image 18/3. Other scattered smaller porta hepatis lymph nodes are present. Other:  No supplemental non-categorized findings. Musculoskeletal: Unremarkable IMPRESSION: 1. Gallbladder wall thickening noted with associated gallstones. Appearance raises suspicion for acute cholecystitis given findings on prior exams including sonographic Murphy sign. 2. No findings of choledocholithiasis. 3.  Likely reactive lymph nodes in the porta hepatis. 4. Dropout of signal in the liver on inphase images raising suspicion for hemochromatosis. 5.  Aortic Atherosclerosis (ICD10-I70.0). Electronically Signed   By: Van Clines M.D.   On: 05/22/2019 08:43   US Abdomen Limited Ruq  Result Date: 05/22/2019 CLINICAL DATA:  Right upper quadrant pain. Gallbladder wall thickening on CT. EXAM: ULTRASOUND ABDOMEN LIMITED RIGHT UPPER QUADRANT COMPARISON:  Abdominal CT earlier this day. FINDINGS: Gallbladder: Distended with gallstones and sludge. Gallbladder wall thickening up to 10 mm. Positive sonographic Murphy sign noted by sonographer. Common bile duct: Diameter: 4 mm, normal. Liver: No focal lesion identified. Within normal limits in parenchymal echogenicity. Portal vein is  patent on color Doppler imaging with normal direction of blood flow towards the liver. Other: None. IMPRESSION: 1. Sonographic findings suspicious for acute cholecystitis. Gallstones, wall thickening, and positive sonographic Murphy sign. 2. Normal common bile duct. Electronically Signed   By: Keith Rake M.D.   On: 05/22/2019 20:11    EKG:   Orders placed or performed during the hospital encounter of 05/21/19  . EKG 12-Lead  . EKG 12-Lead  . ED EKG  . ED EKG  . EKG    ASSESSMENT AND PLAN:  78 year old female with acute cholecystitis status post cholecystostomy tube placement by interventional radiology. History of coronary artery disease, permanent atrial fibrillation, received a heparin drip for procedure but now we can start back on Eliquis. 3.  Diet-controlled diabetes mellitus. 4.  Fever, elevated white count, patient already on Zosyn.  W leukocytosis improved.  More than 50% time spent in counseling, coordination of care    All the records are reviewed and case discussed with Care Management/Social Workerr. Management plans discussed with the patient, family and they are in agreement.  CODE STATUS:  fullCode  TOTAL TIME TAKING CARE OF THIS PATIENT:28minutes.   POSSIBLE D/C IN 1DAYS, DEPENDING ON CLINICAL CONDITION.   Epifanio Lesches M.D on 05/23/2019 at 3:01 PM  Between 7am to 6pm - Pager - [phone]  After 6pm go to [email or web address] - password [email or web address] [facility]  Office  [phone]  CC: Primary care physician; Baxter Hire, MD   Note: This dictation was prepared with Dragon dictation along with smaller phrase technology. Any transcriptional errors that result from this process are unintentional.

## 2019-05-24 LAB — COMPREHENSIVE METABOLIC PANEL
ALT: 96 U/L — ABNORMAL HIGH (ref 0–44)
AST: 56 U/L — ABNORMAL HIGH (ref 15–41)
Albumin: 2.7 g/dL — ABNORMAL LOW (ref 3.5–5.0)
Alkaline Phosphatase: 79 U/L (ref 38–126)
Anion gap: 8 (ref 5–15)
BUN: 7 mg/dL — ABNORMAL LOW (ref 8–23)
CO2: 19 mmol/L — ABNORMAL LOW (ref 22–32)
Calcium: 7.6 mg/dL — ABNORMAL LOW (ref 8.9–10.3)
Chloride: 110 mmol/L (ref 98–111)
Creatinine, Ser: 0.61 mg/dL (ref 0.44–1.00)
GFR calc Af Amer: >60 mL/min (ref >60)
GFR calc non Af Amer: >60 mL/min (ref >60)
Glucose, Bld: 103 mg/dL — ABNORMAL HIGH (ref 70–99)
Potassium: 3.1 mmol/L — ABNORMAL LOW (ref 3.5–5.1)
Sodium: 137 mmol/L (ref 135–145)
Total Bilirubin: 4.7 mg/dL — ABNORMAL HIGH (ref 0.3–1.2)
Total Protein: 5 g/dL — ABNORMAL LOW (ref 6.5–8.1)

## 2019-05-24 MED ORDER — POTASSIUM CHLORIDE CRYS ER 20 MEQ PO TBCR
40.0000 meq | EXTENDED_RELEASE_TABLET | Freq: Once | ORAL | Status: AC
  Administered 2019-05-24: 10:00:00 40 meq via ORAL
  Filled 2019-05-24: qty 2

## 2019-05-24 NOTE — Progress Notes (Signed)
[facility] [place] NAME: Adriana Anderson    MR#:  [id number]  DATE OF BIRTH:  04-01-1941  SUBJECTIVE: Patient is seen at bedside, admitted for acute cholecystitis status post cholecystostomy tube by interventional radiologist this afternoon.  CHIEF COMPLAINT:   Chief Complaint  Patient presents with  . Weakness  Patient seen and evaluated today No abdominal pain Tolerating liquid diet well No nausea and vomiting REVIEW OF SYSTEMS:   ROS CONSTITUTIONAL: No fever, fatigue or weakness.  EYES: No blurred or double vision.  EARS, NOSE, AND THROAT: No tinnitus or ear pain.  RESPIRATORY: No cough, shortness of breath, wheezing or hemoptysis.  CARDIOVASCULAR: No chest pain, orthopnea, edema.  GASTROINTESTINAL: No nausea, vomiting, diarrhea or abdominal pain.  GENITOURINARY: No dysuria, hematuria.  ENDOCRINE: No polyuria, nocturia,  HEMATOLOGY: No anemia, easy bruising or bleeding SKIN: No rash or lesion. MUSCULOSKELETAL: No joint pain or arthritis.   NEUROLOGIC: No tingling, numbness, weakness.  PSYCHIATRY: No anxiety or depression.   DRUG ALLERGIES:   Allergies  Allergen Reactions  . Tetanus-Diphth-Acell Pertussis Swelling  . Sulfa Antibiotics Rash    VITALS:  Blood pressure (!) 147/62, pulse 97, temperature 98.9 F (37.2 C), temperature source Oral, resp. rate 18, height 5' 5.5" (1.664 m), weight 68 kg, SpO2 99 %.  PHYSICAL EXAMINATION:  GENERAL:  78 y.o.-year-old patient lying in the bed with no acute distress.  EYES: Pupils equal, round, reactive to light and accommodation. No scleral icterus. Extraocular muscles intact.  HEENT: Head atraumatic, normocephalic. Oropharynx and nasopharynx clear.  NECK:  Supple, no jugular venous distention. No thyroid enlargement, no tenderness.  LUNGS: Normal breath sounds bilaterally, no wheezing, rales,rhonchi or crepitation. No use of accessory muscles of respiration.  CARDIOVASCULAR:  S1, S2 normal. No murmurs, rubs, or gallops.  ABDOMEN: Soft, nontender, nondistended. Bowel sounds present. No organomegaly or mass cholecystostomy tube present.  EXTREMITIES: No pedal edema, cyanosis, or clubbing.  NEUROLOGIC: Cranial nerves II through XII are intact. Muscle strength 5/5 in all extremities. Sensation intact. Gait not checked.  PSYCHIATRIC: The patient is alert and oriented x 3.  SKIN: No obvious rash, lesion, or ulcer.    LABORATORY PANEL:   CBC Recent Labs  Lab 05/23/19 0058  WBC 6.4  HGB 12.8  HCT 37.6  PLT 130*   ------------------------------------------------------------------------------------------------------------------  Chemistries  Recent Labs  Lab 05/24/19 0518  NA 137  K 3.1*  CL 110  CO2 19*  GLUCOSE 103*  BUN 7*  CREATININE 0.61  CALCIUM 7.6*  AST 56*  ALT 96*  ALKPHOS 79  BILITOT 4.7*   ------------------------------------------------------------------------------------------------------------------  Cardiac Enzymes No results for input(s): TROPONINI in the last 168 hours. ------------------------------------------------------------------------------------------------------------------  RADIOLOGY:  Ir Perc Cholecystostomy  Result Date: 05/23/2019 INDICATION: 78 year old with hyperbilirubinemia and acute cholecystitis. EXAM: PERCUTANEOUS CHOLECYSTOSTOMY TUBE WITH ULTRASOUND AND FLUOROSCOPIC GUIDANCE MEDICATIONS: Inpatient receiving IV antibiotics. ANESTHESIA/SEDATION: Moderate (conscious) sedation was employed during this procedure. A total of Versed 2.0 mg and Fentanyl 150 mcg was administered intravenously. Moderate Sedation Time: 20 minutes. The patient's level of consciousness and vital signs were monitored continuously by radiology nursing throughout the procedure under my direct supervision. FLUOROSCOPY TIME:  Fluoroscopy Time: 1 minutes and 48 seconds, 13 mGy COMPLICATIONS: None immediate. PROCEDURE: Informed written consent was  obtained from the patient after a thorough discussion of the procedural risks, benefits and alternatives. All questions were addressed. Maximal Sterile Barrier Technique was utilized including caps, mask, sterile gowns, sterile gloves, sterile drape, hand hygiene  and skin antiseptic. A timeout was performed prior to the initiation of the procedure. Ultrasound was used to identify the gallbladder. The right anterior abdomen was prepped and draped in sterile fashion. Skin was anesthetized with 1% lidocaine. Using ultrasound guidance, a 21 gauge needle was directed into the distended gallbladder. 0.018 wire was advanced into the gallbladder. Transitional dilator set was placed. At this point, it was apparent that the transitional dilator had popped out of the gallbladder. Therefore, a 21 gauge needle was again directed into the gallbladder with ultrasound guidance. 0.018 wire was placed. Transitional dilator set was placed and confirmed within the gallbladder. Stiff Amplatz wire was placed. Tract was dilated to accommodate a 10.2 [place] multipurpose drain. Bloody dark bile was aspirated. The entire gallbladder was decompressed based on ultrasound. Small amount of contrast was also injected under fluoroscopy. Catheter was sutured to skin and attached to a gravity bag. Fluoroscopic and ultrasound images were taken and saved for documentation. FINDINGS: Distended gallbladder. Percutaneous access was obtained directly into the gallbladder without traversing the liver. 10.2 French drain placed in gallbladder and bloody dark bile was removed. Gallbladder was decompressed at the end of the procedure. IMPRESSION: Successful percutaneous cholecystostomy tube placement with ultrasound and fluoroscopic guidance. Bile specimen was sent for culture. Electronically Signed   By: Markus Daft M.D.   On: 05/23/2019 12:21   Dg Chest Port 1 View  Result Date: 05/23/2019 CLINICAL DATA:  Fevers EXAM: PORTABLE CHEST 1 VIEW COMPARISON:   05/21/2019 FINDINGS: Cardiac shadow is enlarged in size. Postsurgical changes are noted. The lungs are well aerated bilaterally. Mild bibasilar atelectasis is noted. Mild vascular congestion is seen as well. No bony abnormality IMPRESSION: Vascular congestion and mild bibasilar atelectasis. Electronically Signed   By: Inez Catalina M.D.   On: 05/23/2019 15:47    EKG:   Orders placed or performed during the hospital encounter of 05/21/19  . EKG 12-Lead  . EKG 12-Lead  . ED EKG  . ED EKG  . EKG    ASSESSMENT AND PLAN:  78 year old female with history of GERD, bladder cancer, type 2 diabetes mellitus, GERD currently under hospitalist service  -Acute cholecystitis Status post cholecystostomy tube placement Surgery follow-up Continue Zosyn antibiotic Follow-up LFTs Tolerating liquid diet well Advance diet if okay with surgery  -Abnormal LFTs Improving  -Acute hypokalemia Replace potassium  -Atrial fibrillation Heparin drip discontinued Eliquis started for anticoagulation  -Diet controlled diabetes mellitus  -Leukocytosis improved  -DVT prophylaxis  More than 50% time spent in counseling, coordination of care  All the records are reviewed and case discussed with Care Management/Social Workerr. Management plans discussed with the patient, family and they are in agreement.  CODE STATUS: fullCode  TOTAL TIME TAKING CARE OF THIS PATIENT:36 minutes.   POSSIBLE D/C IN 1 to 2 DAYS, DEPENDING ON CLINICAL CONDITION.   Saundra Shelling M.D on 05/24/2019 at 11:05 AM  Between 7am to 6pm - Pager - [phone]  After 6pm go to [email or web address] - password [email or web address] [facility]  Office  [phone]  CC: Primary care physician; Baxter Hire, MD   Note: This dictation was prepared with Dragon dictation along with smaller phrase technology. Any transcriptional errors that result from this process are unintentional.

## 2019-05-24 NOTE — Progress Notes (Signed)
Advanced care plan. Purpose of the Encounter: CODE STATUS Parties in Attendance: Patient Patient's Decision Capacity: Good Subjective/Patient's story: The patient with past medical history of bladder cancer in remission, diabetes and acid reflux presents emergency department complaining of abdominal pain.  The patient reports that the pain is in the center of her abdomen and radiates into her chest.  She states it is severe.  The patient admits to some nausea but denies vomiting.  Ultrasound of the patient's right upper quadrant revealed gallstones but normal common bile duct.  Laboratory evaluation was significant for transaminitis.  The patient was started on Zosyn in the emergency department prior to the hospitalist service being called for admission. Objective/Medical story Patient needs admission for cholecystitis Needs surgical service evaluation and interventional radiology evaluation for cholecystostomy tube placement.  Needs LFT follow-up Goals of care determination:  Advance care directives goals of care treatment plan discussed Patient wants everything done which includes CPR, intubation ventilator if the need arises CODE STATUS: Full code Time spent discussing advanced care planning: 16 minutes

## 2019-05-24 NOTE — Progress Notes (Signed)
Subjective:  CC: Adriana Anderson is a 78 y.o. female  Hospital stay day 2,   Acute cholecystitis  HPI: Status post cholecystostomy tube placement.  No acute issues overnight.  ROS:  General: Denies weight loss, weight gain, fatigue, fevers, chills, and night sweats. Heart: Denies chest pain, palpitations, racing heart, irregular heartbeat, leg pain or swelling, and decreased activity tolerance. Respiratory: Denies breathing difficulty, shortness of breath, wheezing, cough, and sputum. GI: Denies change in appetite, heartburn, nausea, vomiting, constipation, diarrhea, and blood in stool. GU: Denies difficulty urinating, pain with urinating, urgency, frequency, blood in urine.   Objective:   Temp:  [97.8 F (36.6 C)-99.3 F (37.4 C)] 98.7 F (37.1 C) (08/22 1655) Pulse Rate:  [89-106] 106 (08/22 1655) Resp:  [18-25] 18 (08/22 0624) BP: (127-153)/(49-62) 153/59 (08/22 1655) SpO2:  [95 %-100 %] 100 % (08/22 1655)     Height: 5' 5.5" (166.4 cm) Weight: 68 kg BMI (Calculated): 24.57   Intake/Output this shift:   Intake/Output Summary (Last 24 hours) at 05/24/2019 1739 Last data filed at 05/24/2019 1538 Gross per 24 hour  Intake 3367.9 ml  Output 180 ml  Net 3187.9 ml    Constitutional :  alert, cooperative, appears stated age and no distress  Respiratory:  clear to auscultation bilaterally  Cardiovascular:  irregularly irregular rhythm  Gastrointestinal: soft, non-tender; bowel sounds normal; no masses,  no organomegaly.  Cholecystostomy tube with light bilious drainage  Skin: Cool and moist.   Psychiatric: Normal affect, non-agitated, not confused       LABS:  CMP Latest Ref Rng & Units 05/24/2019 05/23/2019 05/22/2019  Glucose 70 - 99 mg/dL 103(H) 90 105(H)  BUN 8 - 23 mg/dL 7(L) 12 14  Creatinine 0.44 - 1.00 mg/dL 0.61 0.75 0.82  Sodium 135 - 145 mmol/L 137 135 134(L)  Potassium 3.5 - 5.1 mmol/L 3.1(L) 3.6 3.4(L)  Chloride 98 - 111 mmol/L 110 110 107  CO2 22 - 32 mmol/L  19(L) 21(L) 20(L)  Calcium 8.9 - 10.3 mg/dL 7.6(L) 7.8(L) 7.9(L)  Total Protein 6.5 - 8.1 g/dL 5.0(L) 5.7(L) 5.8(L)  Total Bilirubin 0.3 - 1.2 mg/dL 4.7(H) 7.8(H) 7.8(H)  Alkaline Phos 38 - 126 U/L 79 93 89  AST 15 - 41 U/L 56(H) 97(H) 106(H)  ALT 0 - 44 U/L 96(H) 136(H) 156(H)   CBC Latest Ref Rng & Units 05/23/2019 05/22/2019 05/21/2019  WBC 4.0 - 10.5 K/uL 6.4 8.5 20.3(H)  Hemoglobin 12.0 - 15.0 g/dL 12.8 13.0 14.8  Hematocrit 36.0 - 46.0 % 37.6 38.5 41.6  Platelets 150 - 400 K/uL 130(L) 147(L) 250    RADS: INDICATION: 78 year old with hyperbilirubinemia and acute cholecystitis.  EXAM: PERCUTANEOUS CHOLECYSTOSTOMY TUBE WITH ULTRASOUND AND FLUOROSCOPIC GUIDANCE  MEDICATIONS: Inpatient receiving IV antibiotics.  ANESTHESIA/SEDATION: Moderate (conscious) sedation was employed during this procedure. A total of Versed 2.0 mg and Fentanyl 150 mcg was administered intravenously.  Moderate Sedation Time: 20 minutes. The patient's level of consciousness and vital signs were monitored continuously by radiology nursing throughout the procedure under my direct supervision.  FLUOROSCOPY TIME:  Fluoroscopy Time: 1 minutes and 48 seconds, 13 mGy  COMPLICATIONS: None immediate.  PROCEDURE: Informed written consent was obtained from the patient after a thorough discussion of the procedural risks, benefits and alternatives. All questions were addressed. Maximal Sterile Barrier Technique was utilized including caps, mask, sterile gowns, sterile gloves, sterile drape, hand hygiene and skin antiseptic. A timeout was performed prior to the initiation of the procedure.  Ultrasound was used to identify  the gallbladder. The right anterior abdomen was prepped and draped in sterile fashion. Skin was anesthetized with 1% lidocaine. Using ultrasound guidance, a 21 gauge needle was directed into the distended gallbladder. 0.018 wire was advanced into the gallbladder. Transitional  dilator set was placed. At this point, it was apparent that the transitional dilator had popped out of the gallbladder. Therefore, a 21 gauge needle was again directed into the gallbladder with ultrasound guidance. 0.018 wire was placed. Transitional dilator set was placed and confirmed within the gallbladder. Stiff Amplatz wire was placed. Tract was dilated to accommodate a 10.2 [place] multipurpose drain. Bloody dark bile was aspirated. The entire gallbladder was decompressed based on ultrasound. Small amount of contrast was also injected under fluoroscopy. Catheter was sutured to skin and attached to a gravity bag.  Fluoroscopic and ultrasound images were taken and saved for documentation.  FINDINGS: Distended gallbladder. Percutaneous access was obtained directly into the gallbladder without traversing the liver. 10.2 French drain placed in gallbladder and bloody dark bile was removed. Gallbladder was decompressed at the end of the procedure.  IMPRESSION: Successful percutaneous cholecystostomy tube placement with ultrasound and fluoroscopic guidance.  Bile specimen was sent for culture.   Electronically Signed   By: Markus Daft M.D.   On: 05/23/2019 12:21 Assessment:  Acute cholecystitis, elevated liver enzymes, status post cholecystostomy tube placement.  Patient is doing well and labs are improving.  Recommending advancing diet as tolerated.  Complete antibiotics course.  And patient will follow-up with Dr. Dahlia Byes as an outpatient for discussion of interval cholecystectomy within the next several months.

## 2019-05-25 LAB — COMPREHENSIVE METABOLIC PANEL
ALT: 65 U/L — ABNORMAL HIGH (ref 0–44)
AST: 31 U/L (ref 15–41)
Albumin: 2.5 g/dL — ABNORMAL LOW (ref 3.5–5.0)
Alkaline Phosphatase: 72 U/L (ref 38–126)
Anion gap: 5 (ref 5–15)
BUN: <5 mg/dL — ABNORMAL LOW (ref 8–23)
CO2: 23 mmol/L (ref 22–32)
Calcium: 8 mg/dL — ABNORMAL LOW (ref 8.9–10.3)
Chloride: 111 mmol/L (ref 98–111)
Creatinine, Ser: 0.73 mg/dL (ref 0.44–1.00)
GFR calc Af Amer: >60 mL/min (ref >60)
GFR calc non Af Amer: >60 mL/min (ref >60)
Glucose, Bld: 113 mg/dL — ABNORMAL HIGH (ref 70–99)
Potassium: 3 mmol/L — ABNORMAL LOW (ref 3.5–5.1)
Sodium: 139 mmol/L (ref 135–145)
Total Bilirubin: 3 mg/dL — ABNORMAL HIGH (ref 0.3–1.2)
Total Protein: 5 g/dL — ABNORMAL LOW (ref 6.5–8.1)

## 2019-05-25 LAB — C DIFFICILE QUICK SCREEN W PCR REFLEX
C Diff antigen: NEGATIVE
C Diff interpretation: NOT DETECTED
C Diff toxin: NEGATIVE

## 2019-05-25 MED ORDER — POTASSIUM CHLORIDE ER 10 MEQ PO TBCR: 10.0000 meq | EXTENDED_RELEASE_TABLET | Freq: Two times a day (BID) | ORAL | 0 refills | Status: DC

## 2019-05-25 MED ORDER — POTASSIUM CHLORIDE CRYS ER 20 MEQ PO TBCR
40.0000 meq | EXTENDED_RELEASE_TABLET | Freq: Once | ORAL | Status: AC
  Administered 2019-05-25: 09:00:00 40 meq via ORAL
  Filled 2019-05-25: qty 2

## 2019-05-25 MED ORDER — AMOXICILLIN-POT CLAVULANATE 875-125 MG PO TABS: 1.0000 | ORAL_TABLET | Freq: Two times a day (BID) | ORAL | 0 refills | Status: AC

## 2019-05-25 NOTE — Discharge Summary (Signed)
[place] [facility] [place] NAME: Adriana Anderson    MR#:  [id number]  DATE OF BIRTH:  1941/07/23  DATE OF ADMISSION:  05/21/2019 ADMITTING PHYSICIAN: Harrie Foreman, MD  DATE OF DISCHARGE: 05/25/2019  PRIMARY CARE PHYSICIAN: Baxter Hire, MD   ADMISSION DIAGNOSIS:  Cholecystitis [K81.9] Pain [R52] RUQ pain [R10.11] Abnormal LFTs [R94.5]  DISCHARGE DIAGNOSIS:  Active Problems:   Transaminitis Acute cholecystitis Acute hypokalemia Chronic atrial fibrillation Diarrhea SECONDARY DIAGNOSIS:   Past Medical History:  Diagnosis Date  . Acid reflux   . Bladder cancer ([place])   . Bladder mass   . Diabetes mellitus, type 2 ([place])   . GERD (gastroesophageal reflux disease)   . History of biopsy of bladder   . History of cystoscopy   . Liver disease   . PONV (postoperative nausea and vomiting)      ADMITTING HISTORY The patient with past medical history of bladder cancer in remission, diabetes and acid reflux presents emergency department complaining of abdominal pain.  The patient reports that the pain is in the center of her abdomen and radiates into her chest.  She states it is severe.  The patient admits to some nausea but denies vomiting.  Ultrasound of the patient's right upper quadrant revealed gallstones but normal common bile duct.  Laboratory evaluation was significant for transaminitis.  The patient was started on Zosyn in the emergency department prior to the hospitalist service being called for admission.  HOSPITAL COURSE:  Patient was admitted to medical floor.  She was started on IV Zosyn antibiotic pain management was done with IV morphine.  Liver function tests were elevated and were followed up closely.  Surgical service evaluated the patient for cholecystitis.  Interventional radiology put cholecystostomy tube.  Patient tolerated procedure well.  Liver function tests improved.  Patient tolerated IV antibiotics well.  She also had  some diarrhea during hospitalization but C. difficile testing was negative.  During hospitalization patient was worked up with CT abdomen, MR abdomen and chest x-ray.  Patient tolerated oral diet well she is,She will continue the drain and follow-up in the surgery clinic.  CONSULTS OBTAINED:  Treatment Team:  Jules Husbands, MD  DRUG ALLERGIES:   Allergies  Allergen Reactions  . Tetanus-Diphth-Acell Pertussis Swelling  . Sulfa Antibiotics Rash    DISCHARGE MEDICATIONS:   Allergies as of 05/25/2019      Reactions   Tetanus-diphth-acell Pertussis Swelling   Sulfa Antibiotics Rash      Medication List    STOP taking these medications   Tums Extra Strength 750 750 MG chewable tablet Generic drug: calcium carbonate     TAKE these medications   amoxicillin-clavulanate 875-125 MG tablet Commonly known as: Augmentin Take 1 tablet by mouth every 12 (twelve) hours for 5 days.   apixaban 5 MG Tabs tablet Commonly known as: ELIQUIS Take 5 mg by mouth 2 (two) times daily.   aspirin EC 81 MG tablet Take 81 mg by mouth at bedtime.   atorvastatin 40 MG tablet Commonly known as: LIPITOR Take by mouth.   CALTRATE 600+D PO Take 1 tablet by mouth 2 (two) times daily.   estradiol 0.1 MG/GM vaginal cream Commonly known as: ESTRACE VAGINAL USE 1 APPLICATION VAGINALLY ONCE A WEEK   loratadine 10 MG tablet Commonly known as: CLARITIN Take 10 mg by mouth daily as needed for allergies.   metoprolol succinate 25 MG 24 hr tablet Commonly known as: TOPROL-XL TAKE ONE AND  ONE-HALF TABLETS TWICE DAILY   NITROGLYCERIN ER PO Take by mouth.   pantoprazole 40 MG tablet Commonly known as: PROTONIX Take 40 mg by mouth daily.   potassium chloride 10 MEQ tablet Commonly known as: Klor-Con 10 Take 1 tablet (10 mEq total) by mouth 2 (two) times daily for 3 days.   solifenacin 10 MG tablet Commonly known as: VESIcare Take 1 tablet (10 mg total) by mouth daily.       Today  Patient  seen today No fever No abdominal pain Tolerating diet okay Hemodynamically stable VITAL SIGNS:  Blood pressure (!) 153/58, pulse 97, temperature 98.7 F (37.1 C), temperature source Oral, resp. rate 20, height 5' 5.5" (1.664 m), weight 68.8 kg, SpO2 96 %.  I/O:    Intake/Output Summary (Last 24 hours) at 05/25/2019 1038 Last data filed at 05/25/2019 0522 Gross per 24 hour  Intake 2723.99 ml  Output 220 ml  Net 2503.99 ml    PHYSICAL EXAMINATION:  Physical Exam  GENERAL:  78 y.o.-year-old patient lying in the bed with no acute distress.  LUNGS: Normal breath sounds bilaterally, no wheezing, rales,rhonchi or crepitation. No use of accessory muscles of respiration.  CARDIOVASCULAR: S1, S2 normal. No murmurs, rubs, or gallops.  ABDOMEN: Soft, non-tender, non-distended. Bowel sounds present. No organomegaly or mass.  Has drain NEUROLOGIC: Moves all 4 extremities. PSYCHIATRIC: The patient is alert and oriented x 3.  SKIN: No obvious rash, lesion, or ulcer.   DATA REVIEW:   CBC Recent Labs  Lab 05/23/19 0058  WBC 6.4  HGB 12.8  HCT 37.6  PLT 130*    Chemistries  Recent Labs  Lab 05/25/19 0504  NA 139  K 3.0*  CL 111  CO2 23  GLUCOSE 113*  BUN <5*  CREATININE 0.73  CALCIUM 8.0*  AST 31  ALT 65*  ALKPHOS 72  BILITOT 3.0*    Cardiac Enzymes No results for input(s): TROPONINI in the last 168 hours.  Microbiology Results  Results for orders placed or performed during the hospital encounter of 05/21/19  SARS Coronavirus 2 [facility] order, Performed in [facility] hospital lab) Nasopharyngeal Nasopharyngeal Swab     Status: None   Collection Time: 05/21/19  9:46 PM   Specimen: Nasopharyngeal Swab  Result Value Ref Range Status   SARS Coronavirus 2 NEGATIVE NEGATIVE Final    Comment: (NOTE) If result is NEGATIVE SARS-CoV-2 target nucleic acids are NOT DETECTED. The SARS-CoV-2 RNA is generally detectable in upper and lower  respiratory specimens during the  acute phase of infection. The lowest  concentration of SARS-CoV-2 viral copies this assay can detect is 250  copies / mL. A negative result does not preclude SARS-CoV-2 infection  and should not be used as the sole basis for treatment or other  patient management decisions.  A negative result may occur with  improper specimen collection / handling, submission of specimen other  than nasopharyngeal swab, presence of viral mutation(s) within the  areas targeted by this assay, and inadequate number of viral copies  (<250 copies / mL). A negative result must be combined with clinical  observations, patient history, and epidemiological information. If result is POSITIVE SARS-CoV-2 target nucleic acids are DETECTED. The SARS-CoV-2 RNA is generally detectable in upper and lower  respiratory specimens dur ing the acute phase of infection.  Positive  results are indicative of active infection with SARS-CoV-2.  Clinical  correlation with patient history and other diagnostic information is  necessary to determine patient infection status.  Positive results do  not rule out bacterial infection or co-infection with other viruses. If result is PRESUMPTIVE POSTIVE SARS-CoV-2 nucleic acids MAY BE PRESENT.   A presumptive positive result was obtained on the submitted specimen  and confirmed on repeat testing.  While 2019 novel coronavirus  (SARS-CoV-2) nucleic acids may be present in the submitted sample  additional confirmatory testing may be necessary for epidemiological  and / or clinical management purposes  to differentiate between  SARS-CoV-2 and other Sarbecovirus currently known to infect humans.  If clinically indicated additional testing with an alternate test  methodology [phone]) is advised. The SARS-CoV-2 RNA is generally  detectable in upper and lower respiratory sp ecimens during the acute  phase of infection. The expected result is Negative. Fact Sheet for Patients:   StrictlyIdeas.no Fact Sheet for Healthcare Providers: BankingDealers.co.za This test is not yet approved or cleared by the [place] FDA and has been authorized for detection and/or diagnosis of SARS-CoV-2 by FDA under an Emergency Use Authorization (EUA).  This EUA will remain in effect (meaning this test can be used) for the duration of the COVID-19 declaration under Section 564(b)(1) of the Act, 21 U.S.C. section 360bbb-3(b)(1), unless the authorization is terminated or revoked sooner. Performed at [facility], ., [place], [place] [address]   C difficile quick scan w PCR reflex     Status: None   Collection Time: 05/25/19  8:52 AM   Specimen: STOOL  Result Value Ref Range Status   C Diff antigen NEGATIVE NEGATIVE Final   C Diff toxin NEGATIVE NEGATIVE Final   C Diff interpretation No C. difficile detected.  Final    Comment: Performed at [facility], [address] [place], [place] [address]    RADIOLOGY:  Dg Chest Port 1 View  Result Date: 05/23/2019 CLINICAL DATA:  Fevers EXAM: PORTABLE CHEST 1 VIEW COMPARISON:  05/21/2019 FINDINGS: Cardiac shadow is enlarged in size. Postsurgical changes are noted. The lungs are well aerated bilaterally. Mild bibasilar atelectasis is noted. Mild vascular congestion is seen as well. No bony abnormality IMPRESSION: Vascular congestion and mild bibasilar atelectasis. Electronically Signed   By: Inez Catalina M.D.   On: 05/23/2019 15:47    Follow up with PCP in 1 week.  Management plans discussed with the patient, family and they are in agreement.  CODE STATUS: Full code    Code Status Orders  (From admission, onward)         Start     Ordered   05/22/19 0535  Full code  Continuous     05/22/19 0542        Code Status History    This patient has a current code status but no historical code status.   Advance Care Planning Activity    Advance  Directive Documentation     Most Recent Value  Type of Advance Directive  Healthcare Power of Attorney  Pre-existing out of facility DNR order (yellow form or pink MOST form)  -  "MOST" Form in Place?  -      TOTAL TIME TAKING CARE OF THIS PATIENT ON DAY OF DISCHARGE: more than 34 minutes.   Saundra Shelling M.D on 05/25/2019 at 10:38 AM  Between 7am to 6pm - Pager - [phone]  After 6pm go to [email or web address] - password [email or web address] [facility]  Office  [phone]  CC: Primary care physician; Baxter Hire, MD  Note: This dictation was prepared with Dragon dictation along with  smaller phrase technology. Any transcriptional errors that result from this process are unintentional.

## 2019-05-26 LAB — GLUCOSE, CAPILLARY
Glucose-Capillary: 99 mg/dL (ref 70–99)
Glucose-Capillary: 137 mg/dL — ABNORMAL HIGH (ref 70–99)
Glucose-Capillary: 114 mg/dL — ABNORMAL HIGH (ref 70–99)
Glucose-Capillary: 122 mg/dL — ABNORMAL HIGH (ref 70–99)
Glucose-Capillary: 102 mg/dL — ABNORMAL HIGH (ref 70–99)

## 2019-05-29 LAB — AEROBIC/ANAEROBIC CULTURE W GRAM STAIN (SURGICAL/DEEP WOUND): Gram Stain: NONE SEEN

## 2019-06-03 ENCOUNTER — Other Ambulatory Visit: Payer: Self-pay | Admitting: Urology

## 2019-06-04 ENCOUNTER — Ambulatory Visit (INDEPENDENT_AMBULATORY_CARE_PROVIDER_SITE_OTHER): Payer: Medicare Other | Admitting: Surgery

## 2019-06-04 ENCOUNTER — Other Ambulatory Visit: Payer: Self-pay

## 2019-06-04 ENCOUNTER — Encounter: Payer: Self-pay | Admitting: Surgery

## 2019-06-04 VITALS — BP 163/84 | HR 88 | Temp 97.9°F | Resp 16 | Ht 65.0 in | Wt 139.2 lb

## 2019-06-04 DIAGNOSIS — R1030 Lower abdominal pain, unspecified: Secondary | ICD-10-CM

## 2019-06-04 NOTE — Patient Instructions (Addendum)
Drink Pedialyte.   CT scheduled 06/05/2019 at 11:15 am [place] .   Do not eat/drink 4 hours prior to having this scan.  Please go to Outpatient imaging today to pick up your prep kit. Please go to the Lab today at [facility] for lab work.  Please see your follow up appointment listed below.

## 2019-06-05 ENCOUNTER — Ambulatory Visit
Admission: RE | Admit: 2019-06-05 | Discharge: 2019-06-05 | Disposition: A | Discharge status: Home / Self Care | Payer: Medicare Other | Source: Ambulatory Visit | Attending: Surgery | Admitting: Surgery

## 2019-06-05 ENCOUNTER — Other Ambulatory Visit: Payer: Self-pay

## 2019-06-05 ENCOUNTER — Telehealth: Payer: Self-pay

## 2019-06-05 ENCOUNTER — Other Ambulatory Visit
Admission: RE | Admit: 2019-06-05 | Discharge: 2019-06-05 | Disposition: A | Discharge status: Home / Self Care | Payer: Medicare Other | Source: Ambulatory Visit | Attending: Surgery | Admitting: Surgery

## 2019-06-05 DIAGNOSIS — R1030 Lower abdominal pain, unspecified: Secondary | ICD-10-CM | POA: Insufficient documentation

## 2019-06-05 LAB — CBC WITH DIFFERENTIAL/PLATELET
Abs Immature Granulocytes: 0.07 10*3/uL (ref 0.00–0.07)
Basophils Absolute: 0.1 10*3/uL (ref 0.0–0.1)
Basophils Relative: 1 %
Eosinophils Absolute: 0.2 10*3/uL (ref 0.0–0.5)
Eosinophils Relative: 1 %
HCT: 38.3 % (ref 36.0–46.0)
Hemoglobin: 12.7 g/dL (ref 12.0–15.0)
Immature Granulocytes: 1 %
Lymphocytes Relative: 28 %
Lymphs Abs: 3.5 10*3/uL (ref 0.7–4.0)
MCH: 31.6 pg (ref 26.0–34.0)
MCHC: 33.2 g/dL (ref 30.0–36.0)
MCV: 95.3 fL (ref 80.0–100.0)
Monocytes Absolute: 0.9 10*3/uL (ref 0.1–1.0)
Monocytes Relative: 7 %
Neutro Abs: 7.9 10*3/uL — ABNORMAL HIGH (ref 1.7–7.7)
Neutrophils Relative %: 62 %
Platelets: 416 10*3/uL — ABNORMAL HIGH (ref 150–400)
RBC: 4.02 MIL/uL (ref 3.87–5.11)
RDW: 13 % (ref 11.5–15.5)
WBC: 12.5 10*3/uL — ABNORMAL HIGH (ref 4.0–10.5)
nRBC: 0 % (ref 0.0–0.2)

## 2019-06-05 MED ORDER — IOHEXOL 300 MG/ML  SOLN
100.0000 mL | Freq: Once | INTRAMUSCULAR | Status: AC | PRN
  Administered 2019-06-05: 12:00:00 100 mL via INTRAVENOUS

## 2019-06-05 NOTE — Telephone Encounter (Signed)
Patient notified of results and reminded of next week appointment.

## 2019-06-05 NOTE — Progress Notes (Signed)
Outpatient Surgical Follow Up  06/05/2019  Adriana Anderson is an 78 y.o. female.   Chief Complaint  Patient presents with  . Routine Post Op    Cholcystostomy Tube    HPI: It is a 78 year old female status post cholecystostomy tube for acute cholecystitis.  She is anticoagulated for A. fib has significant comorbidities including coronary artery disease and atrial fibrillation.  She reports some chills as well as lower abdominal pain that is different from the previous cholecystitis.  The drain is working well.  No fevers or chills.  I have personally reviewed the images showing evidence of good placement of cholecystostomy tube.  No evidence of obstructive jaundice. C diff negative,. No diarrhea   Past Medical History:  Diagnosis Date  . Acid reflux   . Bladder cancer ([place])   . Bladder mass   . Diabetes mellitus, type 2 ([place])   . GERD (gastroesophageal reflux disease)   . History of biopsy of bladder   . History of cystoscopy   . Liver disease   . PONV (postoperative nausea and vomiting)     Past Surgical History:  Procedure Laterality Date  . APPENDECTOMY    . CYSTOSCOPY WITH BIOPSY N/A 11/15/2015   Procedure: CYSTOSCOPY WITH BIOPSY;  Surgeon: [name], MD;  Location: [facility] ORS;  Service: Urology;  Laterality: N/A;  . IR PERC CHOLECYSTOSTOMY  05/23/2019  . LEFT HEART CATH AND CORONARY ANGIOGRAPHY Left 02/23/2017   Procedure: Left Heart Cath and Coronary Angiography;  Surgeon: Isaias Cowman, MD;  Location: [place] LAB;  Service: Cardiovascular;  Laterality: Left;  . TOE SURGERY      Family History  Problem Relation Age of Onset  . Bladder Cancer Brother        x 3   . Brain cancer Father   . Kidney disease Father   . Diabetes Other   . Heart attack Mother   . Heart failure Sister   . Bladder Cancer Brother   . Prostate cancer Neg Hx   . Kidney cancer Neg Hx     Social History:  reports that she quit smoking about 37 years ago. She has never used  smokeless tobacco. She reports current alcohol use. She reports that she does not use drugs.  Allergies:  Allergies  Allergen Reactions  . Tetanus-Diphth-Acell Pertussis Swelling  . Sulfa Antibiotics Rash    Medications reviewed.    ROS Full ROS performed and is otherwise negative other than what is stated in HPI   BP (!) 163/84   Pulse 88   Temp 97.9 F (36.6 C) (Temporal)   Resp 16   Ht 5\' 5"  (1.651 m)   Wt 139 lb 3.2 oz (63.1 kg)   SpO2 96%   BMI 23.16 kg/m   Physical Exam Vitals signs and nursing note reviewed. Exam conducted with a chaperone present.  Constitutional:      General: She is not in acute distress.    Appearance: Normal appearance. She is normal weight.     Comments: debilitated  Cardiovascular:     Rate and Rhythm: Normal rate.     Heart sounds: No murmur.  Pulmonary:     Effort: Pulmonary effort is normal. No respiratory distress.     Breath sounds: No stridor. No wheezing.  Abdominal:     General: Abdomen is flat. There is no distension.     Palpations: There is no mass.     Hernia: No hernia is present.  Comments: Cholecystostomy tube w bile. No infection, diffuse tenderness, no peritonitis.  Skin:    Capillary Refill: Capillary refill takes less than 2 seconds.  Neurological:     General: No focal deficit present.     Mental Status: She is alert and oriented to person, place, and time.  Psychiatric:        Mood and Affect: Mood normal.        Behavior: Behavior normal.        Thought Content: Thought content normal.        Judgment: Judgment normal.     Assessment/Plan: Kristol is a 78 year old female with recent history of acute cholecystitis requiring cholecystostomy tube now with persistent abdominal pain failure to thrive nausea.  I am concerned about the possibility of other intra-abdominal pathology and therefore I will obtain a CT scan of the abdomen and pelvis as well as labs.  Will reevaluate next week obviously we will call  her and update her about the results.  No need for emergent surgical intervention 1. Lower abdominal pain  - CT ABDOMEN PELVIS W CONTRAST; Future - CBC with Differential/Platelet; Future - Comprehensive Metabolic Panel (CMET)    Greater than 50% of the 40 minutes  visit was spent in counseling/coordination of care   Caroleen Hamman, MD [place] [facility]

## 2019-06-11 ENCOUNTER — Ambulatory Visit: Payer: Self-pay | Admitting: Surgery

## 2019-06-11 ENCOUNTER — Ambulatory Visit (INDEPENDENT_AMBULATORY_CARE_PROVIDER_SITE_OTHER): Payer: Medicare Other | Admitting: Physician Assistant

## 2019-06-11 ENCOUNTER — Encounter: Payer: Self-pay | Admitting: Physician Assistant

## 2019-06-11 ENCOUNTER — Other Ambulatory Visit: Payer: Self-pay

## 2019-06-11 VITALS — BP 147/77 | HR 72 | Temp 97.3°F | Ht 65.0 in | Wt 140.6 lb

## 2019-06-11 DIAGNOSIS — K8000 Calculus of gallbladder with acute cholecystitis without obstruction: Secondary | ICD-10-CM

## 2019-06-11 NOTE — Progress Notes (Signed)
Cardiac Clearance faxed to Dr Saralyn Pilar at [facility]. Patient is on Eliquis.  PH: [phone] Fax: [phone]

## 2019-06-11 NOTE — Progress Notes (Signed)
[facility] SURGICAL CLINIC NOTE  06/11/2019  History of Present Illness: Adriana Anderson is a 78 y.o. female with a history of acute cholecystitis being managed with percutaneous cholecystostomy tube given a history of atrial fibrillation requiring anticoagulation and multiple comorbidities. She was seen in follow up on 09/02 by Dr Dahlia Byes and there was concerns for lower abdominal pain. She had CBC drawn which showed stable mild leukocytosis (12k), CMP was not completed, and CT Abdomen/Pelvis showed good placement of cholecystostomy tube and no other acute intra-abdominal etiologies.   Today, she reports that she is feeling better. Her previous lower abdominal pain has resolved. No fever, chills, nausea, or emesis. No evidence of obstructive jaundice. Her appetite has also started to improve. She notes "as this tube improves her symptoms improve." Cholecystostomy with about 120 ccs of bilious output daily. No other acute issues or concerns.    Past Medical History: Past Medical History:  Diagnosis Date  . Acid reflux   . Bladder cancer ([place])   . Bladder mass   . Diabetes mellitus, type 2 ([place])   . GERD (gastroesophageal reflux disease)   . History of biopsy of bladder   . History of cystoscopy   . Liver disease   . PONV (postoperative nausea and vomiting)      Past Surgical History: Past Surgical History:  Procedure Laterality Date  . APPENDECTOMY    . CYSTOSCOPY WITH BIOPSY N/A 11/15/2015   Procedure: CYSTOSCOPY WITH BIOPSY;  Surgeon: [name], MD;  Location: [facility] ORS;  Service: Urology;  Laterality: N/A;  . IR PERC CHOLECYSTOSTOMY  05/23/2019  . LEFT HEART CATH AND CORONARY ANGIOGRAPHY Left 02/23/2017   Procedure: Left Heart Cath and Coronary Angiography;  Surgeon: Isaias Cowman, MD;  Location: [place] [facility];  Service: Cardiovascular;  Laterality: Left;  . TOE SURGERY      Home Medications: Prior to Admission medications   Medication Sig Start  Date End Date Taking? Authorizing Provider  apixaban (ELIQUIS) 5 MG TABS tablet Take 5 mg by mouth 2 (two) times daily.  03/18/17   [provider]  aspirin EC 81 MG tablet Take 81 mg by mouth at bedtime.    [provider]  atorvastatin (LIPITOR) 40 MG tablet Take by mouth. 03/13/17 05/23/19  [provider]  Calcium Carbonate-Vitamin D (CALTRATE 600+D PO) Take 1 tablet by mouth 2 (two) times daily.    [provider]  estradiol (ESTRACE) 0.1 MG/GM vaginal cream USE 1 APPLICATION VAGINALLY ONCE A WEEK 06/03/19   McGowan, Larene Beach A, PA-C  isosorbide mononitrate (IMDUR) 30 MG 24 hr tablet TAKE 1 TABLET BY MOUTH EVERY DAY 06/03/19   [provider]  loratadine (CLARITIN) 10 MG tablet Take 10 mg by mouth daily as needed for allergies.    [provider]  metoprolol succinate (TOPROL-XL) 25 MG 24 hr tablet TAKE ONE AND ONE-HALF TABLETS TWICE DAILY 03/15/18   [provider]  NITROGLYCERIN ER PO Take by mouth.    [provider]  pantoprazole (PROTONIX) 40 MG tablet Take 40 mg by mouth daily. 04/28/18   [provider]  potassium chloride (K-DUR) 10 MEQ tablet Take by mouth. 05/30/19 05/29/20  [provider]  potassium chloride (KLOR-CON 10) 10 MEQ tablet Take 1 tablet (10 mEq total) by mouth 2 (two) times daily for 3 days. 05/25/19 05/28/19  Saundra Shelling, MD    Allergies: Allergies  Allergen Reactions  . Tetanus-Diphth-Acell Pertussis Swelling  . Sulfa Antibiotics Rash  Review of Systems: Review of Systems  Constitutional: Negative for chills and fever.  Respiratory: Negative for cough and shortness of breath.   Cardiovascular: Negative for chest pain and palpitations.  Gastrointestinal: Negative for abdominal pain, diarrhea, nausea and vomiting.  Genitourinary: Negative for dysuria and urgency.  All other systems reviewed and are negative.   Physical Exam There were no vitals taken for this  visit. CONSTITUTIONAL: Well appearing female, NAD HEENT:  Normocephalic, atraumatic, extraocular motion intact. No scleral icterus RESPIRATORY:  Normal respiratory effort without pathologic use of accessory muscles. CARDIOVASCULAR: Heart is regular without murmurs, gallops, or rubs. GI: The abdomen is soft, non-tender, non-distended. Cholecystostomy tube in RUQ with bilious output NEUROLOGIC:  Motor and sensation is grossly normal.  Cranial nerves are grossly intact. PSYCH:  Alert and oriented to person, place and time. Affect is normal. SKIN: Warm, dry, no juandice  Labs/Imaging: CBC (06/04/2019) - WBC 12.5K o/w WNL CMP (06/04/2019) - Not completed   CT Abdomen/Pelvis (06/05/2019) personally reviewed which shows good placement of cholecystostomy tube and is without any other gross intra-abdominal etiologies. Radiologist report also reviewed:   IMPRESSION: 1. Post placement of percutaneous cholecystostomy. The catheter is within the decompressed gallbladder lumen. Moderate pericholecystic edema. 2. No evidence of acute abnormalities within the solid abdominal Organs.   Assessment and Plan: This is a 78 y.o. female who is improving and overall doing better with acute cholecystitis managed with percutaneous cholecystostomy tube secondary to multiple comorbidities including atrial fibrillation requiring anticoagulation with  Eliquis   - Continue percutaneous cholecystostomy tube for at least 6 weeks; monitor output  - pain control prn; antiemetics prn  - will need to obtain medical/cardiac clearance for interval cholecystectomy and determine if it is safe to be off Eliquis prior to procedure.   - will follow up in 6 weeks with Dr Dahlia Byes for discussion and planning of interval cholecystectomy   Face-to-face time spent with the patient and care providers was 15 minutes, with more than 50% of the time spent counseling, educating, and coordinating care of the patient.    -- Edison Simon,  PA-C [facility] 06/11/2019, 9:33 AM [phone] M-F: 7am - 4pm

## 2019-06-11 NOTE — Patient Instructions (Addendum)
We will obtain Cardiac Clearance from Dr Saralyn Pilar for you prior to discussing surgery due to use of Eliquis.  Continue drain care.  Follow up with Dr Dahlia Byes in 6 weeks to discuss surgery.

## 2019-06-12 ENCOUNTER — Telehealth: Payer: Self-pay

## 2019-06-12 NOTE — Telephone Encounter (Signed)
Cardiac Clearance received from Dr.Paraschos-may hold Eliquis for 3 days prior  to procedure.

## 2019-06-16 ENCOUNTER — Inpatient Hospital Stay: Payer: Self-pay | Admitting: Surgery

## 2019-06-30 ENCOUNTER — Telehealth: Payer: Self-pay | Admitting: Surgery

## 2019-06-30 ENCOUNTER — Ambulatory Visit: Payer: Medicare Other | Admitting: Surgery

## 2019-06-30 ENCOUNTER — Encounter: Payer: Self-pay | Admitting: Surgery

## 2019-06-30 ENCOUNTER — Other Ambulatory Visit: Payer: Self-pay

## 2019-06-30 VITALS — BP 123/73 | HR 85 | Temp 97.7°F | Ht 65.0 in | Wt 141.6 lb

## 2019-06-30 DIAGNOSIS — K8000 Calculus of gallbladder with acute cholecystitis without obstruction: Secondary | ICD-10-CM

## 2019-06-30 NOTE — Telephone Encounter (Signed)
Patient is calling and said  She is having pain that goes to a 10,sometimes 11,or 12 she also having acid reflux. Please call patient and advise.

## 2019-06-30 NOTE — Telephone Encounter (Signed)
Per Dr Dahlia Byes patient may come in to be seen in office this morning. Message left for the patient to call back about this.

## 2019-06-30 NOTE — Patient Instructions (Signed)
Follow up with Dr Saralyn Pilar at [facility] on Wednesday 9/30 at 11:15 am.   Follow up here with Dr Dahlia Byes as scheduled.

## 2019-07-02 ENCOUNTER — Encounter: Payer: Self-pay | Admitting: Surgery

## 2019-07-02 NOTE — Progress Notes (Signed)
Outpatient Surgical Follow Up  07/02/2019  Adriana Anderson is an 78 y.o. female.   Chief Complaint  Patient presents with  . Follow-up    HPI:  78 year old female status post cholecystostomy tube for acute cholecystitis.  She is anticoagulated for A. fib has significant comorbidities including coronary artery disease and atrial fibrillation.    She did have a CT scan last time that I have personally reviewed showing no evidence of abscess or exacerbation of her gallbladder.  No free air and no evidence of other acute interabdominal pathology.  She reports that she has reflux symptoms and this felt similar to the ones that she had prior to her open heart surgery.  She notes also that that reflux got relieved after her open heart surgery.    Past Medical History:  Diagnosis Date  . Acid reflux   . Bladder cancer ([place])   . Bladder mass   . Diabetes mellitus, type 2 ([place])   . GERD (gastroesophageal reflux disease)   . History of biopsy of bladder   . History of cystoscopy   . Liver disease   . PONV (postoperative nausea and vomiting)     Past Surgical History:  Procedure Laterality Date  . APPENDECTOMY    . CYSTOSCOPY WITH BIOPSY N/A 11/15/2015   Procedure: CYSTOSCOPY WITH BIOPSY;  Surgeon: [name], MD;  Location: [facility] ORS;  Service: Urology;  Laterality: N/A;  . IR PERC CHOLECYSTOSTOMY  05/23/2019  . LEFT HEART CATH AND CORONARY ANGIOGRAPHY Left 02/23/2017   Procedure: Left Heart Cath and Coronary Angiography;  Surgeon: Isaias Cowman, MD;  Location: [place] [facility];  Service: Cardiovascular;  Laterality: Left;  . TOE SURGERY      Family History  Problem Relation Age of Onset  . Bladder Cancer Brother        x 3   . Brain cancer Father   . Kidney disease Father   . Diabetes Other   . Heart attack Mother   . Heart failure Sister   . Bladder Cancer Brother   . Prostate cancer Neg Hx   . Kidney cancer Neg Hx     Social History:  reports that she quit  smoking about 37 years ago. She has never used smokeless tobacco. She reports current alcohol use. She reports that she does not use drugs.  Allergies:  Allergies  Allergen Reactions  . Tetanus-Diphth-Acell Pertussis Swelling  . Sulfa Antibiotics Rash    Medications reviewed.    ROS Full ROS performed and is otherwise negative other than what is stated in HPI   BP 123/73   Pulse 85   Temp 97.7 F (36.5 C)   Ht 5\' 5"  (1.651 m)   Wt 141 lb 9.6 oz (64.2 kg)   SpO2 95%   BMI 23.56 kg/m   Physical Exam Vitals signs and nursing note reviewed.  Constitutional:      General: She is not in acute distress.    Appearance: She is normal weight.  Eyes:     General: No scleral icterus.       Right eye: No discharge.        Left eye: No discharge.  Neck:     Musculoskeletal: Normal range of motion and neck supple. No neck rigidity or muscular tenderness.  Cardiovascular:     Rate and Rhythm: Normal rate and regular rhythm.  Pulmonary:     Effort: Pulmonary effort is normal. No respiratory distress.     Breath sounds: Normal  breath sounds.  Abdominal:     General: Abdomen is flat. There is no distension.     Tenderness: There is no guarding.     Comments: Cholecystostomy tube in place, no peritonitis  Skin:    General: Skin is warm and dry.     Capillary Refill: Capillary refill takes less than 2 seconds.  Neurological:     General: No focal deficit present.     Mental Status: She is alert and oriented to person, place, and time.  Psychiatric:        Mood and Affect: Mood normal.        Behavior: Behavior normal.        Thought Content: Thought content normal.        Judgment: Judgment normal.       Assessment/Plan: 78 year old female with a history of cholecystitis requiring cholecystostomy tube.  She now comes for retrosternal and epigastric pain not consistent with biliary in nature and more consistent with cardiac in nature.  She does have a significant history of  coronary artery disease and A. fib.  I will defer back to cardiology for an evaluation given the chest pain nature.  I will see her back in a few weeks.  No need for emergent surgical intervention at this time.  Greater than 50% of the 40 minutes  visit was spent in counseling/coordination of care   Caroleen Hamman, MD [place] [facility]

## 2019-07-10 ENCOUNTER — Telehealth: Payer: Self-pay | Admitting: Urology

## 2019-07-10 MED ORDER — NITROFURANTOIN MONOHYD MACRO 100 MG PO CAPS: 100.0000 mg | ORAL_CAPSULE | Freq: Every day | ORAL | 1 refills | Status: DC

## 2019-07-10 NOTE — Telephone Encounter (Signed)
That's fine, thx

## 2019-07-10 NOTE — Telephone Encounter (Signed)
Pt called and states that she needs a refill for Macrobid.

## 2019-07-10 NOTE — Telephone Encounter (Signed)
Patient was placed on Macrobid last year as suppression therapy by Dr. Matilde Sprang. Do you wish to continue?

## 2019-07-10 NOTE — Telephone Encounter (Signed)
Refill sent.

## 2019-07-21 ENCOUNTER — Telehealth: Payer: Self-pay | Admitting: Surgery

## 2019-07-21 ENCOUNTER — Ambulatory Visit: Payer: Medicare Other | Admitting: Surgery

## 2019-07-21 ENCOUNTER — Other Ambulatory Visit: Payer: Self-pay

## 2019-07-21 ENCOUNTER — Encounter: Payer: Self-pay | Admitting: Surgery

## 2019-07-21 VITALS — BP 165/73 | HR 86 | Temp 97.3°F | Resp 16 | Ht 65.0 in | Wt 141.6 lb

## 2019-07-21 DIAGNOSIS — K8000 Calculus of gallbladder with acute cholecystitis without obstruction: Secondary | ICD-10-CM | POA: Diagnosis not present

## 2019-07-21 NOTE — Telephone Encounter (Signed)
Patient is calling and just has some questions about what she was setup for at the hospital. Please call patient and advise.

## 2019-07-21 NOTE — Patient Instructions (Signed)
We will get you scheduled for a Cholangiogram, and if your cholecystostomy tube is patient they will remove it, otherwise, this will be done at the time of your gallbladder removal surgery.   You have requested to have your gallbladder removed. This will be done at [facility] with Dr. Dahlia Byes.  You will most likely be out of work 1-2 weeks for this surgery. You will return after your post-op appointment with a lifting restriction for approximately 4 more weeks.  You will be able to eat anything you would like to following surgery. But, start by eating a bland diet and advance this as tolerated. The Gallbladder diet is below, please go as closely by this diet as possible prior to surgery to avoid any further attacks.  Please see the (blue)pre-care form that you have been given today. If you have any questions, please call our office.  Laparoscopic Cholecystectomy Laparoscopic cholecystectomy is surgery to remove the gallbladder. The gallbladder is located in the upper right part of the abdomen, behind the liver. It is a storage sac for bile, which is produced in the liver. Bile aids in the digestion and absorption of fats. Cholecystectomy is often done for inflammation of the gallbladder (cholecystitis). This condition is usually caused by a buildup of gallstones (cholelithiasis) in the gallbladder. Gallstones can block the flow of bile, and that can result in inflammation and pain. In severe cases, emergency surgery may be required. If emergency surgery is not required, you will have time to prepare for the procedure. Laparoscopic surgery is an alternative to open surgery. Laparoscopic surgery has a shorter recovery time. Your common bile duct may also need to be examined during the procedure. If stones are found in the common bile duct, they may be removed. LET [facility] CARE PROVIDER KNOW ABOUT:  Any allergies you have.  All medicines you are taking, including vitamins, herbs, eye drops,  creams, and over-the-counter medicines.  Previous problems you or members of your family have had with the use of anesthetics.  Any blood disorders you have.  Previous surgeries you have had.    Any medical conditions you have. RISKS AND COMPLICATIONS Generally, this is a safe procedure. However, problems may occur, including:  Infection.  Bleeding.  Allergic reactions to medicines.  Damage to other structures or organs.  A stone remaining in the common bile duct.  A bile leak from the cyst duct that is clipped when your gallbladder is removed.  The need to convert to open surgery, which requires a larger incision in the abdomen. This may be necessary if your surgeon thinks that it is not safe to continue with a laparoscopic procedure. BEFORE THE PROCEDURE  Ask your health care provider about:  Changing or stopping your regular medicines. This is especially important if you are taking diabetes medicines or blood thinners.  Taking medicines such as aspirin and ibuprofen. These medicines can thin your blood. Do not take these medicines before your procedure if your health care provider instructs you not to.  Follow instructions from your health care provider about eating or drinking restrictions.  Let your health care provider know if you develop a cold or an infection before surgery.  Plan to have someone take you home after the procedure.  Ask your health care provider how your surgical site will be marked or identified.  You may be given antibiotic medicine to help prevent infection. PROCEDURE  To reduce your risk of infection:  Your health care team will wash or  sanitize their hands.  Your skin will be washed with soap.  An IV tube may be inserted into one of your veins.  You will be given a medicine to make you fall asleep (general anesthetic).  A breathing tube will be placed in your mouth.  The surgeon will make several small cuts (incisions) in your  abdomen.  A thin, lighted tube (laparoscope) that has a tiny camera on the end will be inserted through one of the small incisions. The camera on the laparoscope will send a picture to a TV screen (monitor) in the operating room. This will give the surgeon a good view inside your abdomen.  A gas will be pumped into your abdomen. This will expand your abdomen to give the surgeon more room to perform the surgery.  Other tools that are needed for the procedure will be inserted through the other incisions. The gallbladder will be removed through one of the incisions.  After your gallbladder has been removed, the incisions will be closed with stitches (sutures), staples, or skin glue.  Your incisions may be covered with a bandage (dressing). The procedure may vary among health care providers and hospitals. AFTER THE PROCEDURE  Your blood pressure, heart rate, breathing rate, and blood oxygen level will be monitored often until the medicines you were given have worn off.  You will be given medicines as needed to control your pain.   This information is not intended to replace advice given to you by your health care provider. Make sure you discuss any questions you have with your health care provider.   Document Released: 09/18/2005 Document Revised: 06/09/2015 Document Reviewed: 04/30/2013 Elsevier Interactive Patient Education 2016 [place] Diet for Gallbladder Conditions A low-fat diet can be helpful if you have pancreatitis or a gallbladder condition. With these conditions, your pancreas and gallbladder have trouble digesting fats. A healthy eating plan with less fat will help rest your pancreas and gallbladder and reduce your symptoms. WHAT DO I NEED TO KNOW ABOUT THIS DIET?  Eat a low-fat diet.  Reduce your fat intake to less than 20-30% of your total daily calories. This is less than 50-60 g of fat per day.  Remember that you need some fat in your diet. Ask your  dietician what your daily goal should be.  Choose nonfat and low-fat healthy foods. Look for the words "nonfat," "low fat," or "fat free."  As a guide, look on the label and choose foods with less than 3 g of fat per serving. Eat only one serving.  Avoid alcohol.  Do not smoke. If you need help quitting, talk with your health care provider.  Eat small frequent meals instead of three large heavy meals. WHAT FOODS CAN I EAT? Grains Include healthy grains and starches such as potatoes, wheat bread, fiber-rich cereal, and brown rice. Choose whole grain options whenever possible. In adults, whole grains should account for 45-65% of your daily calories.  Fruits and Vegetables Eat plenty of fruits and vegetables. Fresh fruits and vegetables add fiber to your diet. Meats and Other Protein Sources Eat lean meat such as chicken and pork. Trim any fat off of meat before cooking it. Eggs, fish, and beans are other sources of protein. In adults, these foods should account for 10-35% of your daily calories. Dairy Choose low-fat milk and dairy options. Dairy includes fat and protein, as well as calcium.  Fats and Oils Limit high-fat foods such as fried foods, sweets, baked goods, sugary  drinks.  Other Creamy sauces and condiments, such as mayonnaise, can add extra fat. Think about whether or not you need to use them, or use smaller amounts or low fat options. WHAT FOODS ARE NOT RECOMMENDED?  High fat foods, such as:  [employer].  Ice cream.  [place] toast.  Sweet rolls.  Pizza.  Cheese bread.  Foods covered with batter, butter, creamy sauces, or cheese.  Fried foods.  Sugary drinks and desserts.  Foods that cause gas or bloating   This information is not intended to replace advice given to you by your health care provider. Make sure you discuss any questions you have with your health care provider.   Document Released: 09/23/2013 Document Reviewed: 09/23/2013 Elsevier Interactive  Patient Education [employer].

## 2019-07-21 NOTE — Telephone Encounter (Signed)
The patient is scheduled for a Cholangiogram at [facility] on 07/28/19 at 8:30 am. She will arrive by 8:15 am and enter in through the [place] entrance. They will remove her tube if it is patent, otherwise it will have to be done with her gallbladder surgery. She is aware of date, time, and instructions.

## 2019-07-22 ENCOUNTER — Telehealth: Payer: Self-pay

## 2019-07-22 ENCOUNTER — Telehealth: Payer: Self-pay | Admitting: Surgery

## 2019-07-22 NOTE — Telephone Encounter (Signed)
Pt has been advised of pre admission date/time, Covid Testing date and Surgery date.  Surgery Date: 08/12/19 with Dr Kris Mouton assisted lap chole with gram.  Preadmission Testing Date: 08/04/19 @ 9:30am-office interview-pt [facility] to [facility] medical mall.  Covid Testing Date: 08/08/19 between 8-10:30am. - patient advised to go to the [place] ([place]  [employer] Video sent via [employer] [facility] and [employer].  Patient has been made aware to call [phone], between 1-3:00pm the day before surgery, to find out what time to arrive.

## 2019-07-22 NOTE — Telephone Encounter (Signed)
Spoke with the patient and she has changed her mind and does not want her Cholecystostomy tube taken out prior to surgery. She wants this done during surgery for her gallbladder scheduled on 08/12/19. I have sent a message to Dr Dahlia Byes to be sure that we can cancel her Cholangiogram scheduled for 07/28/19.

## 2019-07-23 ENCOUNTER — Encounter: Payer: Self-pay | Admitting: Surgery

## 2019-07-23 NOTE — Progress Notes (Signed)
Outpatient Surgical Follow Up  07/23/2019  Adriana [facility] is an 78 y.o. female.   Chief Complaint  Patient presents with  . Follow-up    HPI: 77 yo Female with a history of cholecystitis requiring cholecystostomy tube.  She is fully anticoagulated due to A. fib.  More recently she had some atypical chest pain and she has been seen by cardiology.  Complete work-up has been done there is no need for further coronary interventions at this time.  She is well optimized from a cardiological perspective.  She denies any abdominal pain no fevers no chills some reflux.  No evidence of biliary obstruction.   Past Medical History:  Diagnosis Date  . Acid reflux   . Bladder cancer ([place])   . Bladder mass   . Diabetes mellitus, type 2 ([place])   . GERD (gastroesophageal reflux disease)   . History of biopsy of bladder   . History of cystoscopy   . Liver disease   . PONV (postoperative nausea and vomiting)     Past Surgical History:  Procedure Laterality Date  . APPENDECTOMY    . CYSTOSCOPY WITH BIOPSY N/A 11/15/2015   Procedure: CYSTOSCOPY WITH BIOPSY;  Surgeon: [name], MD;  Location: [facility];  Service: Urology;  Laterality: N/A;  . IR PERC CHOLECYSTOSTOMY  05/23/2019  . LEFT HEART CATH AND CORONARY ANGIOGRAPHY Left 02/23/2017   Procedure: Left Heart Cath and Coronary Angiography;  Surgeon: Isaias Cowman, MD;  Location: [place] [facility];  Service: Cardiovascular;  Laterality: Left;  . TOE SURGERY      Family History  Problem Relation Age of Onset  . Bladder Cancer Brother        x 3   . Brain cancer Father   . Kidney disease Father   . Diabetes Other   . Heart attack Mother   . Heart failure Sister   . Bladder Cancer Brother   . Prostate cancer Neg Hx   . Kidney cancer Neg Hx     Social History:  reports that she quit smoking about 37 years ago. She has never used smokeless tobacco. She reports current alcohol use. She reports that she does not use  drugs.  Allergies:  Allergies  Allergen Reactions  . Tetanus-Diphth-Acell Pertussis Swelling  . Sulfa Antibiotics Rash    Medications reviewed.    ROS Full ROS performed and is otherwise negative other than what is stated in HPI   BP (!) 165/73   Pulse 86   Temp (!) 97.3 F (36.3 C)   Resp 16   Ht 5\' 5"  (1.651 m)   Wt 141 lb 9.6 oz (64.2 kg)   SpO2 95%   BMI 23.56 kg/m   Physical Exam Vitals signs and nursing note reviewed. Exam conducted with a chaperone present.  Constitutional:      Appearance: Normal appearance. She is normal weight.  Eyes:     General: No scleral icterus.       Right eye: No discharge.        Left eye: No discharge.  Neck:     Musculoskeletal: Normal range of motion and neck supple. No neck rigidity.  Cardiovascular:     Rate and Rhythm: Normal rate and regular rhythm.  Pulmonary:     Effort: Pulmonary effort is normal. No respiratory distress.     Breath sounds: Normal breath sounds. No stridor.  Abdominal:     General: Abdomen is flat. There is no distension.     Palpations:  There is no mass.     Tenderness: There is no abdominal tenderness. There is no guarding.     Hernia: No hernia is present.     Comments: Cholecystostomy tube in place with golden bile.  No peritonitis  Musculoskeletal: Normal range of motion.  Lymphadenopathy:     Cervical: No cervical adenopathy.  Skin:    General: Skin is warm and dry.  Neurological:     General: No focal deficit present.     Mental Status: She is alert and oriented to person, place, and time.  Psychiatric:        Mood and Affect: Mood normal.        Behavior: Behavior normal.        Thought Content: Thought content normal.        Judgment: Judgment normal.      Assessment/Plan: 78 year old female with a history of cholecystitis requiring cholecystostomy tube, history of A. fib anticoagulated on Eliquis.  Now she has been evaluated by cardiology and has completed her her preop  optimization.  She is ready for cholecystectomy.  Discussed with patient detail and since she still has a cholecystostomy tube we will obtain a cholangiogram at that time we may remove it.  We will obtain a cholangiogram and I will schedule for robotic cholecystectomy.The risks, benefits, complications, treatment options, and expected outcomes were discussed with the patient. The possibilities of bleeding, recurrent infection, finding a normal gallbladder, perforation of viscus organs, damage to surrounding structures, bile leak, abscess formation, needing a drain placed, the need for additional procedures, reaction to medication, pulmonary aspiration,  failure to diagnose a condition, the possible need to convert to an open procedure, and creating a complication requiring transfusion or operation were discussed with the patient. The patient and/or family concurred with the proposed plan, giving informed consent.  Hold anticoagulation for 2 days prior  To surgery d/w pt in detail Greater than 50% of the 25 minutes  visit was spent in counseling/coordination of care   Caroleen Hamman, MD [place] Surgeon

## 2019-07-23 NOTE — H&P (View-Only) (Signed)
Outpatient Surgical Follow Up  07/23/2019  Adriana Anderson is an 78 y.o. female.   Chief Complaint  Patient presents with  . Follow-up    HPI: 78 yo Female with a history of cholecystitis requiring cholecystostomy tube.  She is fully anticoagulated due to A. fib.  More recently she had some atypical chest pain and she has been seen by cardiology.  Complete work-up has been done there is no need for further coronary interventions at this time.  She is well optimized from a cardiological perspective.  She denies any abdominal pain no fevers no chills some reflux.  No evidence of biliary obstruction.   Past Medical History:  Diagnosis Date  . Acid reflux   . Bladder cancer ([place])   . Bladder mass   . Diabetes mellitus, type 2 ([place])   . GERD (gastroesophageal reflux disease)   . History of biopsy of bladder   . History of cystoscopy   . Liver disease   . PONV (postoperative nausea and vomiting)     Past Surgical History:  Procedure Laterality Date  . APPENDECTOMY    . CYSTOSCOPY WITH BIOPSY N/A 11/15/2015   Procedure: CYSTOSCOPY WITH BIOPSY;  Surgeon: [name], MD;  Location: [facility];  Service: Urology;  Laterality: N/A;  . IR PERC CHOLECYSTOSTOMY  05/23/2019  . LEFT HEART CATH AND CORONARY ANGIOGRAPHY Left 02/23/2017   Procedure: Left Heart Cath and Coronary Angiography;  Surgeon: Isaias Cowman, MD;  Location: [facility];  Service: Cardiovascular;  Laterality: Left;  . TOE SURGERY      Family History  Problem Relation Age of Onset  . Bladder Cancer Brother        x 3   . Brain cancer Father   . Kidney disease Father   . Diabetes Other   . Heart attack Mother   . Heart failure Sister   . Bladder Cancer Brother   . Prostate cancer Neg Hx   . Kidney cancer Neg Hx     Social History:  reports that she quit smoking about 37 years ago. She has never used smokeless tobacco. She reports current alcohol use. She reports that she does not use  drugs.  Allergies:  Allergies  Allergen Reactions  . Tetanus-Diphth-Acell Pertussis Swelling  . Sulfa Antibiotics Rash    Medications reviewed.    ROS Full ROS performed and is otherwise negative other than what is stated in HPI   BP (!) 165/73   Pulse 86   Temp (!) 97.3 F (36.3 C)   Resp 16   Ht 5\' 5"  (1.651 m)   Wt 141 lb 9.6 oz (64.2 kg)   SpO2 95%   BMI 23.56 kg/m   Physical Exam Vitals signs and nursing note reviewed. Exam conducted with a chaperone present.  Constitutional:      Appearance: Normal appearance. She is normal weight.  Eyes:     General: No scleral icterus.       Right eye: No discharge.        Left eye: No discharge.  Neck:     Musculoskeletal: Normal range of motion and neck supple. No neck rigidity.  Cardiovascular:     Rate and Rhythm: Normal rate and regular rhythm.  Pulmonary:     Effort: Pulmonary effort is normal. No respiratory distress.     Breath sounds: Normal breath sounds. No stridor.  Abdominal:     General: Abdomen is flat. There is no distension.     Palpations:  There is no mass.     Tenderness: There is no abdominal tenderness. There is no guarding.     Hernia: No hernia is present.     Comments: Cholecystostomy tube in place with golden bile.  No peritonitis  Musculoskeletal: Normal range of motion.  Lymphadenopathy:     Cervical: No cervical adenopathy.  Skin:    General: Skin is warm and dry.  Neurological:     General: No focal deficit present.     Mental Status: She is alert and oriented to person, place, and time.  Psychiatric:        Mood and Affect: Mood normal.        Behavior: Behavior normal.        Thought Content: Thought content normal.        Judgment: Judgment normal.      Assessment/Plan: 78 year old female with a history of cholecystitis requiring cholecystostomy tube, history of A. fib anticoagulated on Eliquis.  Now she has been evaluated by cardiology and has completed her her preop  optimization.  She is ready for cholecystectomy.  Discussed with patient detail and since she still has a cholecystostomy tube we will obtain a cholangiogram at that time we may remove it.  We will obtain a cholangiogram and I will schedule for robotic cholecystectomy.The risks, benefits, complications, treatment options, and expected outcomes were discussed with the patient. The possibilities of bleeding, recurrent infection, finding a normal gallbladder, perforation of viscus organs, damage to surrounding structures, bile leak, abscess formation, needing a drain placed, the need for additional procedures, reaction to medication, pulmonary aspiration,  failure to diagnose a condition, the possible need to convert to an open procedure, and creating a complication requiring transfusion or operation were discussed with the patient. The patient and/or family concurred with the proposed plan, giving informed consent.  Hold anticoagulation for 2 days prior  To surgery d/w pt in detail Greater than 50% of the 25 minutes  visit was spent in counseling/coordination of care   Caroleen Hamman, MD [place] Surgeon

## 2019-07-23 NOTE — Telephone Encounter (Signed)
Per Dr Dahlia Byes it is alright to cancel the Cholangiogram. Patient is aware this has been canceled.

## 2019-07-25 ENCOUNTER — Telehealth: Payer: Self-pay | Admitting: *Deleted

## 2019-07-28 ENCOUNTER — Ambulatory Visit: Payer: Medicare Other

## 2019-08-04 ENCOUNTER — Encounter
Admission: RE | Admit: 2019-08-04 | Discharge: 2019-08-04 | Disposition: A | Discharge status: Home / Self Care | Payer: Medicare Other | Source: Ambulatory Visit | Attending: Surgery | Admitting: Surgery

## 2019-08-04 ENCOUNTER — Other Ambulatory Visit: Payer: Self-pay

## 2019-08-04 DIAGNOSIS — Z01812 Encounter for preprocedural laboratory examination: Secondary | ICD-10-CM | POA: Insufficient documentation

## 2019-08-04 HISTORY — DX: Essential (primary) hypertension: I10

## 2019-08-04 HISTORY — DX: Angina pectoris, unspecified: I20.9

## 2019-08-04 HISTORY — DX: Atherosclerotic heart disease of native coronary artery without angina pectoris: I25.10

## 2019-08-04 HISTORY — DX: Dyspnea, unspecified: R06.00

## 2019-08-04 HISTORY — DX: Prediabetes: R73.03

## 2019-08-04 HISTORY — DX: Unspecified atrial fibrillation: I48.91

## 2019-08-04 HISTORY — DX: Hyperlipidemia, unspecified: E78.5

## 2019-08-04 LAB — BASIC METABOLIC PANEL
Anion gap: 9 (ref 5–15)
BUN: 16 mg/dL (ref 8–23)
CO2: 26 mmol/L (ref 22–32)
Calcium: 9.4 mg/dL (ref 8.9–10.3)
Chloride: 103 mmol/L (ref 98–111)
Creatinine, Ser: 0.88 mg/dL (ref 0.44–1.00)
GFR calc Af Amer: >60 mL/min (ref >60)
GFR calc non Af Amer: >60 mL/min (ref >60)
Glucose, Bld: 115 mg/dL — ABNORMAL HIGH (ref 70–99)
Potassium: 4 mmol/L (ref 3.5–5.1)
Sodium: 138 mmol/L (ref 135–145)

## 2019-08-04 LAB — CBC
HCT: 37.9 % (ref 36.0–46.0)
Hemoglobin: 12.9 g/dL (ref 12.0–15.0)
MCH: 32.7 pg (ref 26.0–34.0)
MCHC: 34 g/dL (ref 30.0–36.0)
MCV: 95.9 fL (ref 80.0–100.0)
Platelets: 224 10*3/uL (ref 150–400)
RBC: 3.95 MIL/uL (ref 3.87–5.11)
RDW: 12.9 % (ref 11.5–15.5)
WBC: 8.9 10*3/uL (ref 4.0–10.5)
nRBC: 0 % (ref 0.0–0.2)

## 2019-08-04 NOTE — Patient Instructions (Signed)
Your procedure is scheduled on: Tuesday 08/12/19 Report to [facility]. To find out your arrival time please call [phone] between 1PM - 3PM on Monday 08/11/19.  Remember: Instructions that are not followed completely may result in serious medical risk, up to and including death, or upon the discretion of your surgeon and anesthesiologist your surgery may need to be rescheduled.     _X__ 1. Do not eat food after midnight the night before your procedure.                 No gum chewing or hard candies. You may drink clear liquids up to 2 hours                 before you are scheduled to arrive for your surgery- DO not drink clear                 liquids within 2 hours of the start of your surgery.                 Clear Liquids include:  water, apple juice without pulp, clear carbohydrate                 drink such as Clearfast or Gatorade, Black Coffee or Tea (Do not add                 anything to coffee or tea). Diabetics water only  __X__2.  On the morning of surgery brush your teeth with toothpaste and water, you                 may rinse your mouth with mouthwash if you wish.  Do not swallow any              toothpaste of mouthwash.     _X__ 3.  No Alcohol for 24 hours before or after surgery.   _X__ 4.  Do Not Smoke or use e-cigarettes For 24 Hours Prior to Your Surgery.                 Do not use any chewable tobacco products for at least 6 hours prior to                 surgery.  ____  5.  Bring all medications with you on the day of surgery if instructed.   __X__  6.  Notify your doctor if there is any change in your medical condition      (cold, fever, infections).     Do not wear jewelry, make-up, hairpins, clips or nail polish. Do not wear lotions, powders, or perfumes.  Do not shave 48 hours prior to surgery. Men may shave face and neck. Do not bring valuables to the hospital.    [facility] is not responsible for  any belongings or valuables.  Contacts, dentures/partials or body piercings may not be worn into surgery. Bring a case for your contacts, glasses or hearing aids, a denture cup will be supplied. Leave your suitcase in the car. After surgery it may be brought to your room. For patients admitted to the hospital, discharge time is determined by your treatment team.   Patients discharged the day of surgery will not be allowed to drive home.   Please read over the following fact sheets that you were given:   MRSA Information  __X__ Take these medicines the morning of surgery with A SIP OF WATER:  1. atorvastatin (LIPITOR  2. isosorbide mononitrate (IMDUR  3. metoprolol succinate (TOPROL-XL  4. nitrofurantoin, macrocrystal-monohydrate, (MACROBID  5. pantoprazole (PROTONIX  6. famotidine (PEPCID  7. nitroGLYCERIN 2.5 MG CR   ____ Fleet Enema (as directed)   __X__ Use CHG Soap/SAGE wipes as directed  ____ Use inhalers on the day of surgery  ____ Stop metformin/Janumet/Farxiga 2 days prior to surgery    ____ Take 1/2 of usual insulin dose the night before surgery. No insulin the morning          of surgery.   __X__ Stop Blood Thinners Coumadin/Plavix/Xarelto/Pleta/Pradaxa/Eliquis/Effient/Aspirin AS INSTRUCTED  __X__ Stop Anti-inflammatories 7 days before surgery such as Advil, Ibuprofen, Motrin,  BC or Goodies Powder, Naprosyn, Naproxen, Aleve   __X__ Stop all herbal supplements, fish oil or vitamin E until after surgery.    ____ Bring C-Pap to the hospital.

## 2019-08-05 ENCOUNTER — Telehealth: Payer: Self-pay

## 2019-08-05 NOTE — Telephone Encounter (Signed)
Left detailed message to inform patient per Dr.Pabon that her recent lab work was normal. Also, advised patient if she has any questions or concerns to arise to give our office a call.

## 2019-08-05 NOTE — Telephone Encounter (Signed)
-----   Message from Jules Husbands, MD sent at 08/04/2019  3:55 PM EST ----- Please let her know labs are nml ----- Message ----- From: Interface, Lab In [place] Sent: 08/04/2019  12:30 PM EST To: Jules Husbands, MD

## 2019-08-07 NOTE — Telephone Encounter (Signed)
Open in error

## 2019-08-08 ENCOUNTER — Other Ambulatory Visit: Payer: Self-pay

## 2019-08-08 ENCOUNTER — Other Ambulatory Visit
Admission: RE | Admit: 2019-08-08 | Discharge: 2019-08-08 | Disposition: A | Discharge status: Home / Self Care | Payer: Medicare Other | Source: Ambulatory Visit | Attending: Surgery | Admitting: Surgery

## 2019-08-08 DIAGNOSIS — Z20828 Contact with and (suspected) exposure to other viral communicable diseases: Secondary | ICD-10-CM | POA: Diagnosis not present

## 2019-08-08 DIAGNOSIS — Z01812 Encounter for preprocedural laboratory examination: Secondary | ICD-10-CM | POA: Insufficient documentation

## 2019-08-08 LAB — SARS CORONAVIRUS 2 (TAT 6-24 HRS): SARS Coronavirus 2: NEGATIVE

## 2019-08-11 MED ORDER — INDOCYANINE GREEN 25 MG IV SOLR: 7.5000 mg | Freq: Once | INTRAVENOUS | Status: AC

## 2019-08-11 NOTE — Addendum Note (Signed)
Addended by: Caroleen Hamman F on: 08/11/2019 02:32 PM   Modules accepted: Orders

## 2019-08-12 ENCOUNTER — Ambulatory Visit: Payer: Medicare Other

## 2019-08-12 ENCOUNTER — Encounter
Admission: RE | Disposition: A | Discharge status: Home / Self Care | Payer: Self-pay | Source: Home / Self Care | Attending: Surgery

## 2019-08-12 ENCOUNTER — Ambulatory Visit
Admission: RE | Admit: 2019-08-12 | Discharge: 2019-08-12 | Disposition: A | Discharge status: Home / Self Care | Payer: Medicare Other | Attending: Surgery | Admitting: Surgery

## 2019-08-12 ENCOUNTER — Ambulatory Visit: Payer: Medicare Other | Admitting: Anesthesiology

## 2019-08-12 ENCOUNTER — Other Ambulatory Visit: Payer: Self-pay

## 2019-08-12 DIAGNOSIS — Z951 Presence of aortocoronary bypass graft: Secondary | ICD-10-CM | POA: Insufficient documentation

## 2019-08-12 DIAGNOSIS — Z8249 Family history of ischemic heart disease and other diseases of the circulatory system: Secondary | ICD-10-CM | POA: Diagnosis not present

## 2019-08-12 DIAGNOSIS — K8000 Calculus of gallbladder with acute cholecystitis without obstruction: Secondary | ICD-10-CM

## 2019-08-12 DIAGNOSIS — Z8551 Personal history of malignant neoplasm of bladder: Secondary | ICD-10-CM | POA: Insufficient documentation

## 2019-08-12 DIAGNOSIS — I4891 Unspecified atrial fibrillation: Secondary | ICD-10-CM | POA: Insufficient documentation

## 2019-08-12 DIAGNOSIS — Z882 Allergy status to sulfonamides status: Secondary | ICD-10-CM | POA: Diagnosis not present

## 2019-08-12 DIAGNOSIS — I2510 Atherosclerotic heart disease of native coronary artery without angina pectoris: Secondary | ICD-10-CM | POA: Insufficient documentation

## 2019-08-12 DIAGNOSIS — Z7901 Long term (current) use of anticoagulants: Secondary | ICD-10-CM | POA: Insufficient documentation

## 2019-08-12 DIAGNOSIS — K8012 Calculus of gallbladder with acute and chronic cholecystitis without obstruction: Secondary | ICD-10-CM | POA: Insufficient documentation

## 2019-08-12 DIAGNOSIS — E119 Type 2 diabetes mellitus without complications: Secondary | ICD-10-CM | POA: Insufficient documentation

## 2019-08-12 DIAGNOSIS — I10 Essential (primary) hypertension: Secondary | ICD-10-CM | POA: Insufficient documentation

## 2019-08-12 DIAGNOSIS — K8020 Calculus of gallbladder without cholecystitis without obstruction: Secondary | ICD-10-CM

## 2019-08-12 DIAGNOSIS — K811 Chronic cholecystitis: Secondary | ICD-10-CM | POA: Diagnosis present

## 2019-08-12 DIAGNOSIS — Z87891 Personal history of nicotine dependence: Secondary | ICD-10-CM | POA: Diagnosis not present

## 2019-08-12 HISTORY — PX: INTRAOPERATIVE CHOLANGIOGRAM: SHX5230

## 2019-08-12 SURGERY — CHOLECYSTECTOMY, ROBOT-ASSISTED, LAPAROSCOPIC
Anesthesia: General

## 2019-08-12 MED ORDER — LIDOCAINE HCL (CARDIAC) PF 100 MG/5ML IV SOSY
PREFILLED_SYRINGE | INTRAVENOUS | Status: DC | PRN
  Administered 2019-08-12: 100 mg via INTRAVENOUS

## 2019-08-12 MED ORDER — SODIUM CHLORIDE 0.9 % IV SOLN
INTRAVENOUS | Status: DC | PRN
  Administered 2019-08-12: 08:00:00 30 mL

## 2019-08-12 MED ORDER — CEFAZOLIN SODIUM-DEXTROSE 2-4 GM/100ML-% IV SOLN
2.0000 g | INTRAVENOUS | Status: AC
  Administered 2019-08-12: 2 g via INTRAVENOUS

## 2019-08-12 MED ORDER — EPHEDRINE SULFATE 50 MG/ML IJ SOLN
INTRAMUSCULAR | Status: AC
  Filled 2019-08-12: qty 1

## 2019-08-12 MED ORDER — SCOPOLAMINE 1 MG/3DAYS TD PT72
1.0000 | MEDICATED_PATCH | Freq: Once | TRANSDERMAL | Status: DC
  Administered 2019-08-12: 07:00:00 1.5 mg via TRANSDERMAL

## 2019-08-12 MED ORDER — MIDAZOLAM HCL 2 MG/2ML IJ SOLN
INTRAMUSCULAR | Status: DC | PRN
  Administered 2019-08-12: 1 mg via INTRAVENOUS

## 2019-08-12 MED ORDER — PROPOFOL 10 MG/ML IV BOLUS
INTRAVENOUS | Status: DC | PRN
  Administered 2019-08-12: 110 mg via INTRAVENOUS

## 2019-08-12 MED ORDER — ROCURONIUM BROMIDE 100 MG/10ML IV SOLN
INTRAVENOUS | Status: DC | PRN
  Administered 2019-08-12: 30 mg via INTRAVENOUS
  Administered 2019-08-12: 20 mg via INTRAVENOUS

## 2019-08-12 MED ORDER — PROPOFOL 500 MG/50ML IV EMUL
INTRAVENOUS | Status: AC
  Filled 2019-08-12: qty 50

## 2019-08-12 MED ORDER — ROCURONIUM BROMIDE 50 MG/5ML IV SOLN
INTRAVENOUS | Status: AC
  Filled 2019-08-12: qty 1

## 2019-08-12 MED ORDER — FENTANYL CITRATE (PF) 100 MCG/2ML IJ SOLN
INTRAMUSCULAR | Status: AC
  Filled 2019-08-12: qty 2

## 2019-08-12 MED ORDER — LIDOCAINE HCL (PF) 2 % IJ SOLN
INTRAMUSCULAR | Status: AC
  Filled 2019-08-12: qty 10

## 2019-08-12 MED ORDER — SUGAMMADEX SODIUM 200 MG/2ML IV SOLN
INTRAVENOUS | Status: AC
  Filled 2019-08-12: qty 2

## 2019-08-12 MED ORDER — DEXAMETHASONE SODIUM PHOSPHATE 10 MG/ML IJ SOLN
INTRAMUSCULAR | Status: AC
  Filled 2019-08-12: qty 1

## 2019-08-12 MED ORDER — ONDANSETRON HCL 4 MG/2ML IJ SOLN: 4.0000 mg | Freq: Once | INTRAMUSCULAR | Status: DC | PRN

## 2019-08-12 MED ORDER — HYDROCODONE-ACETAMINOPHEN 5-325 MG PO TABS
ORAL_TABLET | ORAL | Status: AC
  Filled 2019-08-12: qty 1

## 2019-08-12 MED ORDER — EPHEDRINE SULFATE 50 MG/ML IJ SOLN
INTRAMUSCULAR | Status: DC | PRN
  Administered 2019-08-12: 10 mg via INTRAVENOUS

## 2019-08-12 MED ORDER — HYDROCODONE-ACETAMINOPHEN 5-325 MG PO TABS
1.0000 | ORAL_TABLET | Freq: Four times a day (QID) | ORAL | Status: DC | PRN
  Administered 2019-08-12 (×2): 1 via ORAL

## 2019-08-12 MED ORDER — ONDANSETRON HCL 4 MG/2ML IJ SOLN
INTRAMUSCULAR | Status: DC | PRN
  Administered 2019-08-12: 4 mg via INTRAVENOUS

## 2019-08-12 MED ORDER — ONDANSETRON HCL 4 MG/2ML IJ SOLN
INTRAMUSCULAR | Status: AC
  Filled 2019-08-12: qty 2

## 2019-08-12 MED ORDER — BUPIVACAINE HCL (PF) 0.25 % IJ SOLN
INTRAMUSCULAR | Status: AC
  Filled 2019-08-12: qty 30

## 2019-08-12 MED ORDER — BUPIVACAINE-EPINEPHRINE (PF) 0.25% -1:200000 IJ SOLN
INTRAMUSCULAR | Status: DC | PRN
  Administered 2019-08-12: 30 mL

## 2019-08-12 MED ORDER — ACETAMINOPHEN 500 MG PO TABS
1000.0000 mg | ORAL_TABLET | ORAL | Status: AC
  Administered 2019-08-12: 07:00:00 1000 mg via ORAL

## 2019-08-12 MED ORDER — EPINEPHRINE PF 1 MG/ML IJ SOLN
INTRAMUSCULAR | Status: AC
  Filled 2019-08-12: qty 1

## 2019-08-12 MED ORDER — ACETAMINOPHEN 500 MG PO TABS
ORAL_TABLET | ORAL | Status: AC
  Administered 2019-08-12: 1000 mg via ORAL
  Filled 2019-08-12: qty 2

## 2019-08-12 MED ORDER — SCOPOLAMINE 1 MG/3DAYS TD PT72
MEDICATED_PATCH | TRANSDERMAL | Status: AC
  Administered 2019-08-12: 07:00:00 1.5 mg via TRANSDERMAL
  Filled 2019-08-12: qty 1

## 2019-08-12 MED ORDER — FENTANYL CITRATE (PF) 100 MCG/2ML IJ SOLN
INTRAMUSCULAR | Status: AC
  Administered 2019-08-12: 10:00:00 25 ug via INTRAVENOUS
  Filled 2019-08-12: qty 2

## 2019-08-12 MED ORDER — SUGAMMADEX SODIUM 200 MG/2ML IV SOLN
INTRAVENOUS | Status: DC | PRN
  Administered 2019-08-12: 130 mg via INTRAVENOUS

## 2019-08-12 MED ORDER — CEFAZOLIN SODIUM-DEXTROSE 2-4 GM/100ML-% IV SOLN
INTRAVENOUS | Status: AC
  Filled 2019-08-12: qty 100

## 2019-08-12 MED ORDER — GABAPENTIN 300 MG PO CAPS
ORAL_CAPSULE | ORAL | Status: AC
  Administered 2019-08-12: 07:00:00 300 mg via ORAL
  Filled 2019-08-12: qty 1

## 2019-08-12 MED ORDER — MIDAZOLAM HCL 2 MG/2ML IJ SOLN
INTRAMUSCULAR | Status: AC
  Filled 2019-08-12: qty 2

## 2019-08-12 MED ORDER — DEXAMETHASONE SODIUM PHOSPHATE 10 MG/ML IJ SOLN
INTRAMUSCULAR | Status: DC | PRN
  Administered 2019-08-12: 10 mg via INTRAVENOUS

## 2019-08-12 MED ORDER — CHLORHEXIDINE GLUCONATE CLOTH 2 % EX PADS
6.0000 | MEDICATED_PAD | Freq: Once | CUTANEOUS | Status: AC
  Administered 2019-08-12: 6 via TOPICAL

## 2019-08-12 MED ORDER — GABAPENTIN 300 MG PO CAPS
300.0000 mg | ORAL_CAPSULE | ORAL | Status: AC
  Administered 2019-08-12: 07:00:00 300 mg via ORAL

## 2019-08-12 MED ORDER — HYDROCODONE-ACETAMINOPHEN 5-325 MG PO TABS: 1.0000 | ORAL_TABLET | Freq: Four times a day (QID) | ORAL | 0 refills | Status: DC | PRN

## 2019-08-12 MED ORDER — PROPOFOL 500 MG/50ML IV EMUL
INTRAVENOUS | Status: DC | PRN
  Administered 2019-08-12: 150 ug/kg/min via INTRAVENOUS

## 2019-08-12 MED ORDER — FENTANYL CITRATE (PF) 100 MCG/2ML IJ SOLN
INTRAMUSCULAR | Status: DC | PRN
  Administered 2019-08-12 (×3): 50 ug via INTRAVENOUS

## 2019-08-12 MED ORDER — CHLORHEXIDINE GLUCONATE CLOTH 2 % EX PADS: 6.0000 | MEDICATED_PAD | Freq: Once | CUTANEOUS | Status: DC

## 2019-08-12 MED ORDER — INDOCYANINE GREEN 25 MG IV SOLR
7.5000 mg | Freq: Once | INTRAVENOUS | Status: AC
  Administered 2019-08-12: 07:00:00 7.5 mg via INTRAVENOUS
  Filled 2019-08-12: qty 25

## 2019-08-12 MED ORDER — LACTATED RINGERS IV SOLN
INTRAVENOUS | Status: DC
  Administered 2019-08-12 (×2): via INTRAVENOUS

## 2019-08-12 MED ORDER — FENTANYL CITRATE (PF) 100 MCG/2ML IJ SOLN
25.0000 ug | INTRAMUSCULAR | Status: DC | PRN
  Administered 2019-08-12 (×2): 25 ug via INTRAVENOUS

## 2019-08-12 SURGICAL SUPPLY — 43 items
"PENCIL ELECTRO HAND CTR " (MISCELLANEOUS) ×2 IMPLANT
CANISTER SUCT 1200ML W/VALVE (MISCELLANEOUS) ×4 IMPLANT
CHLORAPREP W/TINT 26 (MISCELLANEOUS) ×4 IMPLANT
CLIP VESOLOCK MED LG 6/CT (CLIP) ×4 IMPLANT
COVER WAND RF STERILE (DRAPES) ×4 IMPLANT
DECANTER SPIKE VIAL GLASS SM (MISCELLANEOUS) ×4 IMPLANT
DEFOGGER SCOPE WARMER CLEARIFY (MISCELLANEOUS) ×4 IMPLANT
DERMABOND ADVANCED (GAUZE/BANDAGES/DRESSINGS) ×2
DERMABOND ADVANCED .7 DNX12 (GAUZE/BANDAGES/DRESSINGS) ×2 IMPLANT
DRAPE 3/4 80X56 (DRAPES) ×4 IMPLANT
DRAPE ARM DVNC X/XI (DISPOSABLE) ×8 IMPLANT
DRAPE COLUMN DVNC XI (DISPOSABLE) ×2 IMPLANT
DRAPE DA VINCI XI ARM (DISPOSABLE) ×8
DRAPE DA VINCI XI COLUMN (DISPOSABLE) ×2
ELECT CAUTERY BLADE 6.4 (BLADE) ×4 IMPLANT
ELECT REM PT RETURN 9FT ADLT (ELECTROSURGICAL) ×4
ELECTRODE REM PT RTRN 9FT ADLT (ELECTROSURGICAL) ×2 IMPLANT
GLOVE BIO SURGEON STRL SZ7 (GLOVE) ×8 IMPLANT
GOWN STRL REUS W/ TWL LRG LVL3 (GOWN DISPOSABLE) ×8 IMPLANT
GOWN STRL REUS W/TWL LRG LVL3 (GOWN DISPOSABLE) ×8
IRRIGATION STRYKERFLOW (MISCELLANEOUS) IMPLANT
IRRIGATOR STRYKERFLOW (MISCELLANEOUS)
IV NS 1000ML (IV SOLUTION)
IV NS 1000ML BAXH (IV SOLUTION) IMPLANT
KIT PINK PAD W/HEAD ARE REST (MISCELLANEOUS) ×4
KIT PINK PAD W/HEAD ARM REST (MISCELLANEOUS) ×2 IMPLANT
LABEL OR SOLS (LABEL) ×4 IMPLANT
NEEDLE HYPO 22GX1.5 SAFETY (NEEDLE) ×4 IMPLANT
NS IRRIG 500ML POUR BTL (IV SOLUTION) ×4 IMPLANT
OBTURATOR OPTICAL STANDARD 8MM (TROCAR) ×2
OBTURATOR OPTICAL STND 8 DVNC (TROCAR) ×2
OBTURATOR OPTICALSTD 8 DVNC (TROCAR) ×2 IMPLANT
PACK LAP CHOLECYSTECTOMY (MISCELLANEOUS) ×4 IMPLANT
PENCIL ELECTRO HAND CTR (MISCELLANEOUS) ×4 IMPLANT
POUCH SPECIMEN RETRIEVAL 10MM (ENDOMECHANICALS) ×4 IMPLANT
SEAL CANN UNIV 5-8 DVNC XI (MISCELLANEOUS) ×8 IMPLANT
SEAL XI 5MM-8MM UNIVERSAL (MISCELLANEOUS) ×8
SOLUTION ELECTROLUBE (MISCELLANEOUS) ×4 IMPLANT
SPONGE LAP 18X18 RF (DISPOSABLE) ×4 IMPLANT
SUT MNCRL AB 4-0 PS2 18 (SUTURE) ×4 IMPLANT
SUT VICRYL 0 AB UR-6 (SUTURE) ×8 IMPLANT
TROCAR 130MM GELPORT  DAV (MISCELLANEOUS) ×4 IMPLANT
TUBING EVAC SMOKE HEATED PNEUM (TUBING) ×4 IMPLANT

## 2019-08-12 NOTE — Anesthesia Preprocedure Evaluation (Signed)
Anesthesia Evaluation  Patient identified by MRN, date of birth, ID band Patient awake    Reviewed: Allergy & Precautions, NPO status , Patient's Chart, lab work & pertinent test results, reviewed documented beta blocker date and time   History of Anesthesia Complications (+) PONV and history of anesthetic complications  Airway Mallampati: II       Dental   Pulmonary neg sleep apnea, neg COPD, Not current smoker, former smoker,           Cardiovascular hypertension, Pt. on home beta blockers and Pt. on medications + CAD and + CABG  (-) CHF + dysrhythmias Atrial Fibrillation (-) Valvular Problems/Murmurs     Neuro/Psych neg Seizures    GI/Hepatic Neg liver ROS, GERD  Medicated and Controlled,  Endo/Other  neg diabetes (borderline)  Renal/GU negative Renal ROS     Musculoskeletal   Abdominal   Peds  Hematology  (+) anemia ,   Anesthesia Other Findings   Reproductive/Obstetrics                             Anesthesia Physical Anesthesia Plan  ASA: III  Anesthesia Plan: General   Post-op Pain Management:    Induction: Intravenous  PONV Risk Score and Plan: 4 or greater and Dexamethasone, Ondansetron, Midazolam and Scopolamine patch - Pre-op  Airway Management Planned: Oral ETT  Additional Equipment:   Intra-op Plan:   Post-operative Plan:   Informed Consent: I have reviewed the patients History and Physical, chart, labs and discussed the procedure including the risks, benefits and alternatives for the proposed anesthesia with the patient or authorized representative who has indicated his/her understanding and acceptance.       Plan Discussed with:   Anesthesia Plan Comments:         Anesthesia Quick Evaluation

## 2019-08-12 NOTE — Discharge Instructions (Signed)

## 2019-08-12 NOTE — Anesthesia Post-op Follow-up Note (Signed)
Anesthesia QCDR form completed.        

## 2019-08-12 NOTE — Op Note (Signed)
Robotic assisted laparoscopic Cholecystectomy with INtra-operative cholangiogram  Pre-operative Diagnosis: Chronic cholecystitis  Post-operative Diagnosis: same  Procedure:  Robotic assisted laparoscopic Cholecystectomy with cholangiogram  Surgeon: Caroleen Hamman, MD FACS  Anesthesia: Gen. with endotracheal tube  Findings: Chronic  Cholecystitis  Patent cystic duct w/o filling defects ,contrast reaches the duodenum  Estimated Blood Loss: 10cc       Specimens: Gallbladder           Complications: none   Procedure Details  The patient was seen again in the Holding Room. The benefits, complications, treatment options, and expected outcomes were discussed with the patient. The risks of bleeding, infection, recurrence of symptoms, failure to resolve symptoms, bile duct damage, bile duct leak, retained common bile duct stone, bowel injury, any of which could require further surgery and/or ERCP, stent, or papillotomy were reviewed with the patient. The likelihood of improving the patient's symptoms with return to their baseline status is good.  The patient and/or family concurred with the proposed plan, giving informed consent.  The patient was taken to Operating Room, identified  and the procedure verified as Laparoscopic Cholecystectomy.  A Time Out was held and the above information confirmed.  Prior to the induction of general anesthesia, antibiotic prophylaxis was administered. VTE prophylaxis was in place. General endotracheal anesthesia was then administered and tolerated well. I performed a cholnagiogram via the cholecystostomy tube after injecting contrast. I visualized the contrast revealing a Patent cystic duct w/o filling defects ,contrast reaches the duodenum. Please note that I did my own interpretation of the images and my decision making process was made based on my own interpretation. I removed the cholecystostomy tube. The abdomen was prepped with Chloraprep and draped in the  sterile fashion. The patient was positioned in the supine position.  Cut down technique was used to enter the abdominal cavity and a Hasson trochar was placed after two vicryl stitches were anchored to the fascia. Pneumoperitoneum was then created with CO2 and tolerated well without any adverse changes in the patient's vital signs.  Three 8-mm ports were placed under direct vision. All skin incisions  were infiltrated with a local anesthetic agent before making the incision and placing the trocars.   The patient was positioned  in reverse Trendelenburg, robot was brought to the surgical field and docked in the standard fashion.  We made sure all the instrumentation was kept indirect view at all times and that there were no collision between the arms. I scrubbed out and went to the console.  The gallbladder was identified, the fundus grasped and retracted cephalad. Adhesions were lysed bluntly. The infundibulum was grasped and retracted laterally, exposing the peritoneum overlying the triangle of Calot. This was then divided and exposed in a blunt fashion. An extended critical view of the cystic duct and cystic artery was obtained.  The cystic duct was clearly identified and bluntly dissected.   Artery and duct were double clipped and divided. Using ICG cholangiography we visualize the cystic duct and  no abnormal biliary ductal anatomy or evidence of bile injuries were observed. The gallbladder was taken from the gallbladder fossa in a retrograde fashion with the electrocautery.  Hemostasis was achieved with the electrocautery. nspection of the right upper quadrant was performed. No bleeding, bile duct injury or leak, or bowel injury was noted. Robotic instruments and robotic arms were undocked in the standard fashion.  I scrubbed back in.  The gallbladder was removed and placed in an Endocatch bag.   Pneumoperitoneum was released.  The periumbilical port site was closed with interrumpted 0 Vicryl  sutures. 4-0 subcuticular Monocryl was used to close the skin. Dermabond was  applied.  The patient was then extubated and brought to the recovery room in stable condition. Sponge, lap, and needle counts were correct at closure and at the conclusion of the case.               Caroleen Hamman, MD, FACS

## 2019-08-12 NOTE — Anesthesia Postprocedure Evaluation (Signed)
Anesthesia Post Note  Patient: Adriana [facility]  Procedure(s) Performed: XI ROBOTIC ASSISTED LAPAROSCOPIC CHOLECYSTECTOMY with IOC (N/A ) INTRAOPERATIVE CHOLANGIOGRAM  Patient location during evaluation: PACU Anesthesia Type: General Level of consciousness: awake and alert Pain management: pain level controlled Vital Signs Assessment: post-procedure vital signs reviewed and stable Respiratory status: spontaneous breathing and respiratory function stable Cardiovascular status: stable Anesthetic complications: no     Last Vitals:  Vitals:   08/12/19 0937 08/12/19 0946  BP: (!) 178/70 (!) 169/76  Pulse: 71 69  Resp: 17 18  Temp:    SpO2: 100% 99%    Last Pain:  Vitals:   08/12/19 0622  TempSrc: Oral  PainSc: 0-No pain                 Ronaldo Crilly K

## 2019-08-12 NOTE — Interval H&P Note (Signed)
History and Physical Interval Note:  08/12/2019 7:20 AM  Adriana [facility]  has presented today for surgery, with the diagnosis of Hx of cholecystitis.  The various methods of treatment have been discussed with the patient and family. After consideration of risks, benefits and other options for treatment, the patient has consented to  Procedure(s): XI ROBOTIC [place] with IOC (N/A) as a surgical intervention.  The patient's history has been reviewed, patient examined, no change in status, stable for surgery.  I have reviewed the patient's chart and labs.  Questions were answered to the patient's satisfaction.     [place]

## 2019-08-12 NOTE — Transfer of Care (Signed)
Immediate Anesthesia Transfer of Care Note  Patient: Adriana [facility]  Procedure(s) Performed: XI ROBOTIC ASSISTED LAPAROSCOPIC CHOLECYSTECTOMY with IOC (N/A ) INTRAOPERATIVE CHOLANGIOGRAM  Patient Location: PACU  Anesthesia Type:General  Level of Consciousness: awake  Airway & Oxygen Therapy: Patient connected to face mask oxygen  Post-op Assessment: Post -op Vital signs reviewed and stable  Post vital signs: stable  Last Vitals:  Vitals Value Taken Time  BP 188/69 08/12/19 0927  Temp    Pulse 82 08/12/19 0927  Resp 21 08/12/19 0927  SpO2 100 % 08/12/19 0927    Last Pain:  Vitals:   08/12/19 0622  TempSrc: Oral  PainSc: 0-No pain         Complications: No apparent anesthesia complications

## 2019-08-12 NOTE — Anesthesia Procedure Notes (Signed)
Procedure Name: Intubation Date/Time: 08/12/2019 7:46 AM Performed by: Aline Brochure, CRNA Pre-anesthesia Checklist: Patient identified, Emergency Drugs available, Suction available and Patient being monitored Patient Re-evaluated:Patient Re-evaluated prior to induction Oxygen Delivery Method: Circle system utilized Preoxygenation: Pre-oxygenation with 100% oxygen Induction Type: IV induction Ventilation: Mask ventilation without difficulty Laryngoscope Size: McGraph and 3 Grade View: Grade I Tube type: Oral Tube size: 7.0 mm Number of attempts: 1 Airway Equipment and Method: Stylet and Video-laryngoscopy Placement Confirmation: ETT inserted through vocal cords under direct vision,  positive ETCO2 and breath sounds checked- equal and bilateral Secured at: 21 cm Tube secured with: Tape Dental Injury: Teeth and Oropharynx as per pre-operative assessment

## 2019-08-13 ENCOUNTER — Encounter: Payer: Self-pay | Admitting: Surgery

## 2019-08-13 LAB — SURGICAL PATHOLOGY

## 2019-08-18 ENCOUNTER — Other Ambulatory Visit: Payer: Medicare Other | Admitting: Urology

## 2019-09-01 ENCOUNTER — Other Ambulatory Visit: Payer: Self-pay

## 2019-09-01 ENCOUNTER — Telehealth (INDEPENDENT_AMBULATORY_CARE_PROVIDER_SITE_OTHER): Payer: Medicare Other | Admitting: Physician Assistant

## 2019-09-01 DIAGNOSIS — Z09 Encounter for follow-up examination after completed treatment for conditions other than malignant neoplasm: Secondary | ICD-10-CM

## 2019-09-01 DIAGNOSIS — K8000 Calculus of gallbladder with acute cholecystitis without obstruction: Secondary | ICD-10-CM

## 2019-09-01 NOTE — Progress Notes (Signed)
[facility] SURGICAL OFFICE VISIT - TELEPHONE   Virtual Visit via Telemedicine Note I connected with Adriana Anderson by telephone at her home on 09/01/19 at  9:30 AM EST and verified that I was speaking with the correct person using their name and two idenfiers/date of birth.   I discussed the limitations, risks, security and privacy concerns of performing an evaluation and management service by telephone and the availability of in person appointments. I also discussed with the patient that there may be a patient responsible charge related to this service. The patient expressed understanding and agreed to proceed.   History of Present Illness: Adriana Anderson is a 77 y.o. 20 days s/p robotic assisted laparoscopic cholecystectomy with IOC for chronic cholecystitis with Dr Dahlia Byes.  Today, she reports that the first 5 days after surgery she had pain but this has resolved. Now her only complaint is nausea but this resolves spontaneously. No issues reported with incisions. No fever, chills. Tolerating PO. No issues with bowel function.    Physical Examination:  Constitutional: NAD, normal effort, examination limited by telephone visit  Assessment/Plan:  Adriana Anderson is a 78 y.o. robotic assisted laparoscopic cholecystectomy with IOC for chronic cholecystitis   - pain control prn  - declined antiemetics  - discussed return precautions  - complete lifting restrictions  - Reviewed pathology: chronic cholecystitis  - rtc prn    From [facility] outpatient surgery office, I provided 10 minutes of non-face-to-face time during this encounter.  -- Edison Simon, PA-C [facility] 09/01/2019, 9:25 AM [phone] M-F: 7am - 4pm

## 2019-09-08 ENCOUNTER — Telehealth: Payer: Self-pay | Admitting: Urology

## 2019-09-08 MED ORDER — NITROFURANTOIN MONOHYD MACRO 100 MG PO CAPS: 100.0000 mg | ORAL_CAPSULE | Freq: Every day | ORAL | 3 refills | Status: DC

## 2019-09-08 NOTE — Telephone Encounter (Signed)
Looks like she is on daily nitrofurantoin ppx from macdiarmid, ok to refill, thanks  Nickolas Madrid, MD 09/08/2019

## 2019-09-08 NOTE — Telephone Encounter (Signed)
Pt called office and needs a refill for abx.  She has upcoming cysto appt.

## 2019-09-11 DIAGNOSIS — U071 COVID-19: Secondary | ICD-10-CM

## 2019-09-11 HISTORY — DX: COVID-19: U07.1

## 2019-09-15 ENCOUNTER — Other Ambulatory Visit: Payer: Medicare Other | Admitting: Urology

## 2019-10-01 ENCOUNTER — Ambulatory Visit: Payer: Medicare Other | Admitting: Urology

## 2019-10-01 ENCOUNTER — Encounter: Payer: Self-pay | Admitting: Urology

## 2019-10-01 ENCOUNTER — Other Ambulatory Visit: Payer: Self-pay

## 2019-10-01 VITALS — BP 153/71 | HR 88 | Ht 65.0 in | Wt 141.0 lb

## 2019-10-01 DIAGNOSIS — Z8603 Personal history of neoplasm of uncertain behavior: Secondary | ICD-10-CM | POA: Diagnosis not present

## 2019-10-01 LAB — URINALYSIS, COMPLETE
Bilirubin, UA: NEGATIVE
Glucose, UA: NEGATIVE
Ketones, UA: NEGATIVE
Nitrite, UA: NEGATIVE
Protein,UA: NEGATIVE
RBC, UA: NEGATIVE
Specific Gravity, UA: 1.02 (ref 1.005–1.030)
Urobilinogen, Ur: 0.2 mg/dL (ref 0.2–1.0)
pH, UA: 5.5 (ref 5.0–7.5)

## 2019-10-01 LAB — MICROSCOPIC EXAMINATION: RBC, Urine: NONE SEEN /hpf (ref 0–2)

## 2019-10-01 NOTE — Progress Notes (Signed)
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 Cystoscopy was performed with the a flexible cystoscope. The urethra, bladder neck and entire bladder was visualized in a standard fashion, and mucosa grossly normal. The ureteral orifices were visualized in their normal location and orientation.Retroflexion normal.   CT urogram 02/14/2019 reviewed, NED  PLAN: RTC 6 months for cystoscopy, then yearly if negative Next CT imaging due in 2 years (01/2021)  Nickolas Madrid, MD 10/01/2019

## 2019-12-03 NOTE — Anesthesia Preprocedure Evaluation (Addendum)
Anesthesia Evaluation  Patient identified by MRN, date of birth, ID band Patient awake    Reviewed: Allergy & Precautions, NPO status , Patient's Chart, lab work & pertinent test results  History of Anesthesia Complications (+) PONV and history of anesthetic complications  Airway Mallampati: II  TM Distance: >3 FB Neck ROM: Full    Dental   Pulmonary former smoker,    breath sounds clear to auscultation       Cardiovascular hypertension, + angina (Chronic stable angina w/ exertion) + CAD (S/p CABG x3 03/2017)  (-) DOE + dysrhythmias (Non-sustained VT) Atrial Fibrillation  Rhythm:Regular Rate:Normal   HLD  Stress test (08/2018): Negative for ischemia  TTE (08/2018): NORMAL LEFT VENTRICULAR SYSTOLIC FUNCTION  WITH MILD LVH NORMAL RIGHT VENTRICULAR SYSTOLIC FUNCTION MODERATE MR MILD AR, TR EF 55%   Neuro/Psych    GI/Hepatic GERD  Controlled,  Endo/Other  diabetes (Pre-DM)  Renal/GU      Musculoskeletal   Abdominal   Peds  Hematology  (+) anemia ,   Anesthesia Other Findings Bladder cancer  Reproductive/Obstetrics                            Anesthesia Physical Anesthesia Plan  ASA: III  Anesthesia Plan: MAC   Post-op Pain Management:    Induction: Intravenous  PONV Risk Score and Plan: 3 and TIVA, Midazolam and Treatment may vary due to age or medical condition  Airway Management Planned: Nasal Cannula  Additional Equipment:   Intra-op Plan:   Post-operative Plan:   Informed Consent: I have reviewed the patients History and Physical, chart, labs and discussed the procedure including the risks, benefits and alternatives for the proposed anesthesia with the patient or authorized representative who has indicated his/her understanding and acceptance.       Plan Discussed with: CRNA and Anesthesiologist  Anesthesia Plan Comments:         Anesthesia Quick  Evaluation

## 2019-12-04 ENCOUNTER — Encounter: Payer: Self-pay | Admitting: Ophthalmology

## 2019-12-04 ENCOUNTER — Other Ambulatory Visit: Payer: Self-pay

## 2019-12-08 ENCOUNTER — Other Ambulatory Visit: Admission: RE | Admit: 2019-12-08 | Payer: Medicare PPO | Source: Ambulatory Visit

## 2019-12-08 NOTE — Discharge Instructions (Signed)

## 2019-12-09 ENCOUNTER — Other Ambulatory Visit: Payer: Self-pay

## 2019-12-09 ENCOUNTER — Other Ambulatory Visit
Admission: RE | Admit: 2019-12-09 | Discharge: 2019-12-09 | Disposition: A | Discharge status: Home / Self Care | Payer: Medicare PPO | Source: Ambulatory Visit | Attending: Ophthalmology | Admitting: Ophthalmology

## 2019-12-09 DIAGNOSIS — Z20822 Contact with and (suspected) exposure to covid-19: Secondary | ICD-10-CM | POA: Diagnosis not present

## 2019-12-09 DIAGNOSIS — Z01812 Encounter for preprocedural laboratory examination: Secondary | ICD-10-CM | POA: Diagnosis present

## 2019-12-10 ENCOUNTER — Other Ambulatory Visit: Payer: Self-pay

## 2019-12-10 ENCOUNTER — Encounter
Admission: RE | Disposition: A | Discharge status: Home / Self Care | Payer: Self-pay | Source: Home / Self Care | Attending: Ophthalmology

## 2019-12-10 ENCOUNTER — Ambulatory Visit: Payer: Medicare PPO | Admitting: Anesthesiology

## 2019-12-10 ENCOUNTER — Ambulatory Visit
Admission: RE | Admit: 2019-12-10 | Discharge: 2019-12-10 | Disposition: A | Discharge status: Home / Self Care | Payer: Medicare PPO | Attending: Ophthalmology | Admitting: Ophthalmology

## 2019-12-10 ENCOUNTER — Encounter: Payer: Self-pay | Admitting: Ophthalmology

## 2019-12-10 DIAGNOSIS — K219 Gastro-esophageal reflux disease without esophagitis: Secondary | ICD-10-CM | POA: Diagnosis not present

## 2019-12-10 DIAGNOSIS — I10 Essential (primary) hypertension: Secondary | ICD-10-CM | POA: Diagnosis not present

## 2019-12-10 DIAGNOSIS — Z7901 Long term (current) use of anticoagulants: Secondary | ICD-10-CM | POA: Diagnosis not present

## 2019-12-10 DIAGNOSIS — Z951 Presence of aortocoronary bypass graft: Secondary | ICD-10-CM | POA: Insufficient documentation

## 2019-12-10 DIAGNOSIS — Z7982 Long term (current) use of aspirin: Secondary | ICD-10-CM | POA: Diagnosis not present

## 2019-12-10 DIAGNOSIS — H2511 Age-related nuclear cataract, right eye: Secondary | ICD-10-CM | POA: Insufficient documentation

## 2019-12-10 DIAGNOSIS — Z8551 Personal history of malignant neoplasm of bladder: Secondary | ICD-10-CM | POA: Insufficient documentation

## 2019-12-10 DIAGNOSIS — Z87891 Personal history of nicotine dependence: Secondary | ICD-10-CM | POA: Insufficient documentation

## 2019-12-10 DIAGNOSIS — I4891 Unspecified atrial fibrillation: Secondary | ICD-10-CM | POA: Insufficient documentation

## 2019-12-10 DIAGNOSIS — Z79899 Other long term (current) drug therapy: Secondary | ICD-10-CM | POA: Insufficient documentation

## 2019-12-10 DIAGNOSIS — Z9842 Cataract extraction status, left eye: Secondary | ICD-10-CM | POA: Diagnosis not present

## 2019-12-10 DIAGNOSIS — M069 Rheumatoid arthritis, unspecified: Secondary | ICD-10-CM | POA: Insufficient documentation

## 2019-12-10 DIAGNOSIS — Z882 Allergy status to sulfonamides status: Secondary | ICD-10-CM | POA: Insufficient documentation

## 2019-12-10 DIAGNOSIS — E1136 Type 2 diabetes mellitus with diabetic cataract: Secondary | ICD-10-CM | POA: Diagnosis not present

## 2019-12-10 DIAGNOSIS — I2510 Atherosclerotic heart disease of native coronary artery without angina pectoris: Secondary | ICD-10-CM | POA: Insufficient documentation

## 2019-12-10 HISTORY — DX: Motion sickness, initial encounter: T75.3XXA

## 2019-12-10 HISTORY — PX: CATARACT EXTRACTION W/PHACO: SHX586

## 2019-12-10 LAB — SARS CORONAVIRUS 2 (TAT 6-24 HRS): SARS Coronavirus 2: NEGATIVE

## 2019-12-10 SURGERY — PHACOEMULSIFICATION, CATARACT, WITH IOL INSERTION
Anesthesia: Monitor Anesthesia Care | Site: Eye | Laterality: Right

## 2019-12-10 MED ORDER — BRIMONIDINE TARTRATE-TIMOLOL 0.2-0.5 % OP SOLN
OPHTHALMIC | Status: DC | PRN
  Administered 2019-12-10: 1 [drp] via OPHTHALMIC

## 2019-12-10 MED ORDER — TETRACAINE HCL 0.5 % OP SOLN
1.0000 [drp] | OPHTHALMIC | Status: DC | PRN
  Administered 2019-12-10 (×3): 1 [drp] via OPHTHALMIC

## 2019-12-10 MED ORDER — MOXIFLOXACIN HCL 0.5 % OP SOLN
1.0000 [drp] | OPHTHALMIC | Status: DC | PRN
  Administered 2019-12-10 (×3): 1 [drp] via OPHTHALMIC

## 2019-12-10 MED ORDER — CEFUROXIME OPHTHALMIC INJECTION 1 MG/0.1 ML
INJECTION | OPHTHALMIC | Status: DC | PRN
  Administered 2019-12-10: 0.1 mL via INTRACAMERAL

## 2019-12-10 MED ORDER — NA HYALUR & NA CHOND-NA HYALUR 0.4-0.35 ML IO KIT
PACK | INTRAOCULAR | Status: DC | PRN
  Administered 2019-12-10: 1 mL via INTRAOCULAR

## 2019-12-10 MED ORDER — ARMC OPHTHALMIC DILATING DROPS
1.0000 "application " | OPHTHALMIC | Status: DC | PRN
  Administered 2019-12-10 (×3): 1 via OPHTHALMIC

## 2019-12-10 MED ORDER — ONDANSETRON HCL 4 MG/2ML IJ SOLN
INTRAMUSCULAR | Status: DC | PRN
  Administered 2019-12-10: 4 mg via INTRAVENOUS

## 2019-12-10 MED ORDER — EPINEPHRINE PF 1 MG/ML IJ SOLN
INTRAOCULAR | Status: DC | PRN
  Administered 2019-12-10: 50 mL via OPHTHALMIC

## 2019-12-10 MED ORDER — LACTATED RINGERS IV SOLN: 100.0000 mL/h | INTRAVENOUS | Status: DC

## 2019-12-10 MED ORDER — ACETAMINOPHEN 10 MG/ML IV SOLN: 1000.0000 mg | Freq: Once | INTRAVENOUS | Status: DC | PRN

## 2019-12-10 MED ORDER — FENTANYL CITRATE (PF) 100 MCG/2ML IJ SOLN
INTRAMUSCULAR | Status: DC | PRN
  Administered 2019-12-10: 50 ug via INTRAVENOUS

## 2019-12-10 MED ORDER — ONDANSETRON HCL 4 MG/2ML IJ SOLN: 4.0000 mg | Freq: Once | INTRAMUSCULAR | Status: DC | PRN

## 2019-12-10 MED ORDER — LIDOCAINE HCL (PF) 2 % IJ SOLN
INTRAOCULAR | Status: DC | PRN
  Administered 2019-12-10: 2 mL

## 2019-12-10 MED ORDER — MIDAZOLAM HCL 2 MG/2ML IJ SOLN
INTRAMUSCULAR | Status: DC | PRN
  Administered 2019-12-10: 1 mg via INTRAVENOUS

## 2019-12-10 SURGICAL SUPPLY — 23 items
CANNULA ANT/CHMB 27G (MISCELLANEOUS) ×1 IMPLANT
CANNULA ANT/CHMB 27GA (MISCELLANEOUS) ×3 IMPLANT
GLOVE SURG LX 7.5 STRW (GLOVE) ×2
GLOVE SURG LX STRL 7.5 STRW (GLOVE) ×1 IMPLANT
GLOVE SURG TRIUMPH 8.0 PF LTX (GLOVE) ×3 IMPLANT
GOWN STRL REUS W/ TWL LRG LVL3 (GOWN DISPOSABLE) ×2 IMPLANT
GOWN STRL REUS W/TWL LRG LVL3 (GOWN DISPOSABLE) ×4
LENS IOL DIOP 22.0 (Intraocular Lens) ×3 IMPLANT
LENS IOL TECNIS MONO 22.0 (Intraocular Lens) IMPLANT
MARKER SKIN DUAL TIP RULER LAB (MISCELLANEOUS) ×3 IMPLANT
NDL CAPSULORHEX 25GA (NEEDLE) ×1 IMPLANT
NDL FILTER BLUNT 18X1 1/2 (NEEDLE) ×2 IMPLANT
NEEDLE CAPSULORHEX 25GA (NEEDLE) ×3 IMPLANT
NEEDLE FILTER BLUNT 18X 1/2SAF (NEEDLE) ×4
NEEDLE FILTER BLUNT 18X1 1/2 (NEEDLE) ×2 IMPLANT
PACK CATARACT BRASINGTON (MISCELLANEOUS) ×3 IMPLANT
PACK EYE AFTER SURG (MISCELLANEOUS) ×3 IMPLANT
PACK OPTHALMIC (MISCELLANEOUS) ×3 IMPLANT
SOLUTION OPHTHALMIC SALT (MISCELLANEOUS) ×3 IMPLANT
SYR 3ML LL SCALE MARK (SYRINGE) ×6 IMPLANT
SYR TB 1ML LUER SLIP (SYRINGE) ×3 IMPLANT
WATER STERILE IRR 250ML POUR (IV SOLUTION) ×3 IMPLANT
WIPE NON LINTING 3.25X3.25 (MISCELLANEOUS) ×3 IMPLANT

## 2019-12-10 NOTE — Op Note (Signed)
LOCATION:  [place]   PREOPERATIVE DIAGNOSIS:    Nuclear sclerotic cataract right eye. H25.11   POSTOPERATIVE DIAGNOSIS:  Nuclear sclerotic cataract right eye.     PROCEDURE:  Phacoemusification with posterior chamber intraocular lens placement of the right eye   ULTRASOUND TIME: Procedure(s) with comments: CATARACT EXTRACTION PHACO AND INTRAOCULAR LENS PLACEMENT (IOC) RIGHT 5.43 00:53.3 10.2% (Right) - has to go day before surgery for covid testing per PAT testing  LENS:   Implant Name Type Inv. Item Serial No. Manufacturer Lot No. LRB No. Used Action  LENS IOL DIOP 22.0 - [id number] Intraocular Lens LENS IOL DIOP 22.0 [id number]  Right 1 Implanted         SURGEON:  Wyonia Hough, MD   ANESTHESIA:  Topical with tetracaine drops and 2% Xylocaine jelly, augmented with 1% preservative-free intracameral lidocaine.    COMPLICATIONS:  None.   DESCRIPTION OF PROCEDURE:  The patient was identified in the holding room and transported to the operating room and placed in the supine position under the operating microscope.  The right eye was identified as the operative eye and it was prepped and draped in the usual sterile ophthalmic fashion.   A 1 millimeter clear-corneal paracentesis was made at the 12:00 position.  0.5 ml of preservative-free 1% lidocaine was injected into the anterior chamber. The anterior chamber was filled with Viscoat viscoelastic.  A 2.4 millimeter keratome was used to make a near-clear corneal incision at the 9:00 position.  A curvilinear capsulorrhexis was made with a cystotome and capsulorrhexis forceps.  Balanced salt solution was used to hydrodissect and hydrodelineate the nucleus.   Phacoemulsification was then used in stop and chop fashion to remove the lens nucleus and epinucleus.  The remaining cortex was then removed using the irrigation and aspiration handpiece. Provisc was then placed into the capsular bag to distend it for lens  placement.  A lens was then injected into the capsular bag.  The remaining viscoelastic was aspirated.   Wounds were hydrated with balanced salt solution.  The anterior chamber was inflated to a physiologic pressure with balanced salt solution.  No wound leaks were noted. Cefuroxime 0.1 ml of a 10mg /ml solution was injected into the anterior chamber for a dose of 1 mg of intracameral antibiotic at the completion of the case.   Timolol and Brimonidine drops were applied to the eye.  The patient was taken to the recovery room in stable condition without complications of anesthesia or surgery.   Pacen Watford 12/10/2019, 10:47 AM

## 2019-12-10 NOTE — Anesthesia Procedure Notes (Signed)
Procedure Name: MAC Performed by: Jassen Sarver, CRNA Pre-anesthesia Checklist: Patient identified, Emergency Drugs available, Suction available, Timeout performed and Patient being monitored Patient Re-evaluated:Patient Re-evaluated prior to induction Oxygen Delivery Method: Nasal cannula Placement Confirmation: positive ETCO2       

## 2019-12-10 NOTE — H&P (Signed)

## 2019-12-10 NOTE — Transfer of Care (Signed)
Immediate Anesthesia Transfer of Care Note  Patient: Adriana Anderson  Procedure(s) Performed: CATARACT EXTRACTION PHACO AND INTRAOCULAR LENS PLACEMENT (IOC) RIGHT 5.43 00:53.3 10.2% (Right Eye)  Patient Location: PACU  Anesthesia Type: MAC  Level of Consciousness: awake, alert  and patient cooperative  Airway and Oxygen Therapy: Patient Spontanous Breathing and Patient connected to supplemental oxygen  Post-op Assessment: Post-op Vital signs reviewed, Patient's Cardiovascular Status Stable, Respiratory Function Stable, Patent Airway and No signs of Nausea or vomiting  Post-op Vital Signs: Reviewed and stable  Complications: No apparent anesthesia complications

## 2019-12-10 NOTE — Anesthesia Postprocedure Evaluation (Signed)
Anesthesia Post Note  Patient: Adriana Anderson  Procedure(s) Performed: CATARACT EXTRACTION PHACO AND INTRAOCULAR LENS PLACEMENT (IOC) RIGHT 5.43 00:53.3 10.2% (Right Eye)     Patient location during evaluation: PACU Anesthesia Type: MAC Level of consciousness: awake and alert Pain management: pain level controlled Vital Signs Assessment: post-procedure vital signs reviewed and stable Respiratory status: spontaneous breathing, nonlabored ventilation, respiratory function stable and patient connected to nasal cannula oxygen Cardiovascular status: stable and blood pressure returned to baseline Postop Assessment: no apparent nausea or vomiting Anesthetic complications: no    Bralin Garry A  Rayshaun Needle

## 2019-12-11 ENCOUNTER — Encounter: Payer: Self-pay | Admitting: *Deleted

## 2020-03-31 ENCOUNTER — Other Ambulatory Visit: Payer: Self-pay

## 2020-03-31 ENCOUNTER — Encounter: Payer: Self-pay | Admitting: Urology

## 2020-03-31 ENCOUNTER — Ambulatory Visit: Payer: Medicare PPO | Admitting: Urology

## 2020-03-31 VITALS — BP 124/73 | HR 86 | Ht 65.0 in | Wt 144.0 lb

## 2020-03-31 DIAGNOSIS — Z8603 Personal history of neoplasm of uncertain behavior: Secondary | ICD-10-CM | POA: Diagnosis not present

## 2020-03-31 MED ORDER — LIDOCAINE HCL URETHRAL/MUCOSAL 2 % EX GEL
1.0000 "application " | Freq: Once | CUTANEOUS | Status: AC
  Administered 2020-03-31: 1 via URETHRAL

## 2020-03-31 NOTE — Progress Notes (Signed)
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 Cystoscopy was performed with the a flexible cystoscope. The urethra, bladder neck and entire bladder was visualized in a standard fashion, and mucosa grossly normal. The ureteral orifices were visualized in their normal location and orientation.Retroflexion normal.   CT urogram 02/14/2019 reviewed, NED  PLAN: No recurrence RTC 1 yearfor cystoscopy and CT urogram  Nickolas Madrid, MD 03/31/2020

## 2020-04-01 LAB — URINALYSIS, COMPLETE
Bilirubin, UA: NEGATIVE
Glucose, UA: NEGATIVE
Ketones, UA: NEGATIVE
Nitrite, UA: NEGATIVE
Protein,UA: NEGATIVE
RBC, UA: NEGATIVE
Specific Gravity, UA: 1.015 (ref 1.005–1.030)
Urobilinogen, Ur: 0.2 mg/dL (ref 0.2–1.0)
pH, UA: 5.5 (ref 5.0–7.5)

## 2020-04-01 LAB — MICROSCOPIC EXAMINATION: Epithelial Cells (non renal): >10 /hpf — AB (ref 0–10)

## 2020-05-20 ENCOUNTER — Other Ambulatory Visit: Payer: Self-pay | Admitting: Urology

## 2020-05-20 DIAGNOSIS — N3281 Overactive bladder: Secondary | ICD-10-CM

## 2020-07-15 ENCOUNTER — Other Ambulatory Visit: Payer: Self-pay | Admitting: Urology

## 2020-08-31 ENCOUNTER — Other Ambulatory Visit: Payer: Self-pay | Admitting: Urology

## 2020-08-31 NOTE — Telephone Encounter (Signed)
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 Please advise.

## 2021-04-02 ENCOUNTER — Other Ambulatory Visit: Payer: Self-pay | Admitting: Urology

## 2021-04-02 DIAGNOSIS — N3281 Overactive bladder: Secondary | ICD-10-CM

## 2021-04-12 IMAGING — CT CT ABD-PELV W/ CM
2 of 5 series · 16 of 46 positions shown, 18 images · IV contrast (APPLIED)
Comparison: May 21, 2019

CLINICAL DATA: Epigastric pain for 1 week and fever.

EXAM:
CT ABDOMEN AND PELVIS WITH CONTRAST
TECHNIQUE: Multidetector CT imaging of the abdomen and pelvis was performed
using the standard protocol following bolus administration of
intravenous contrast.
CONTRAST:  100mL OMNIPAQUE IOHEXOL 300 MG/ML  SOLN

[Series 2: axial st · axial · 0.77mm/px · z∈[-654,-254]mm · 13 of 92 slices shown, 15 images]
[im 6/92  soft-tissue]
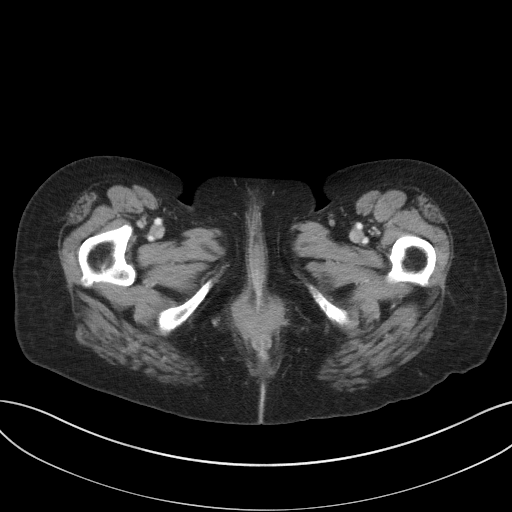
[im 6/92  bone]
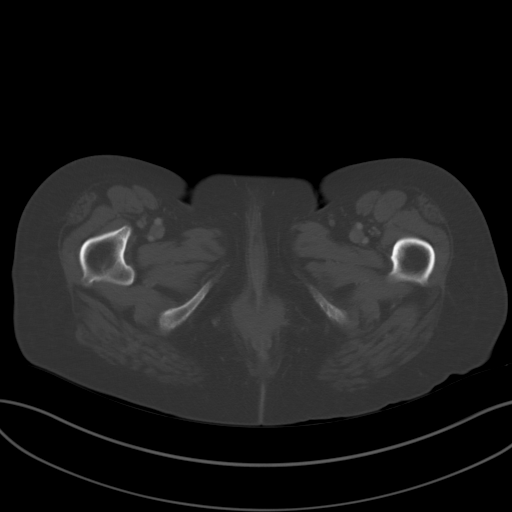
[im 11/92  soft-tissue]
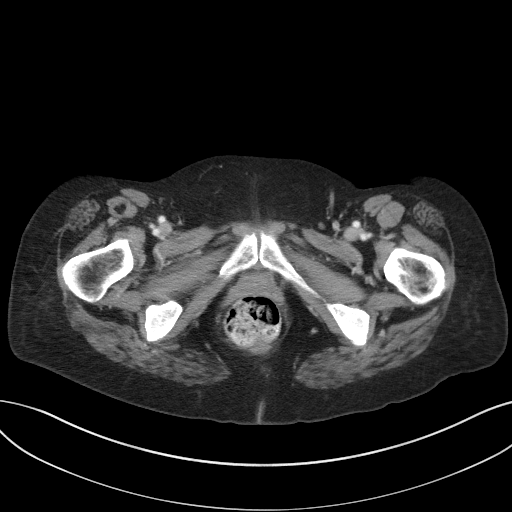
[im 21/92  soft-tissue]
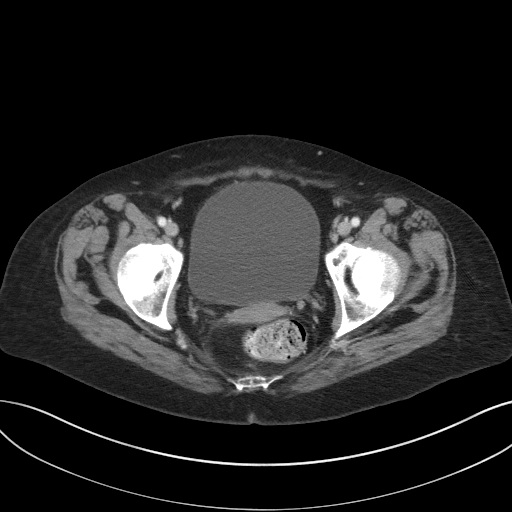
[im 26/92  soft-tissue]
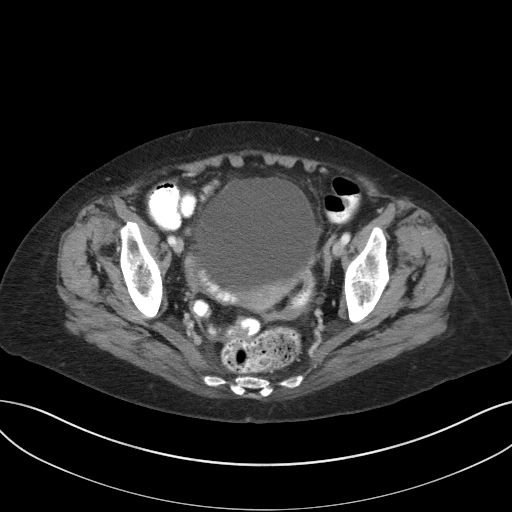
[im 31/92  soft-tissue]
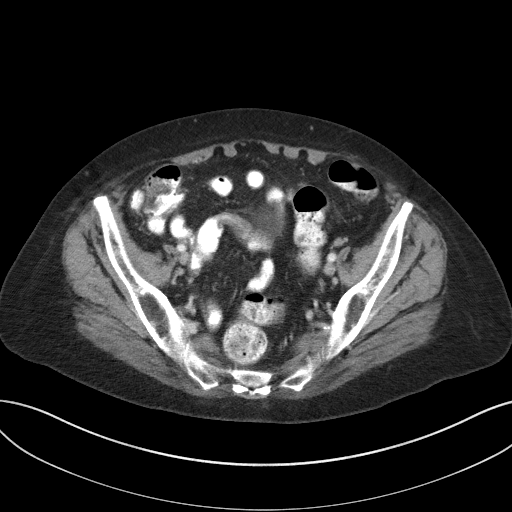
[im 41/92  soft-tissue]
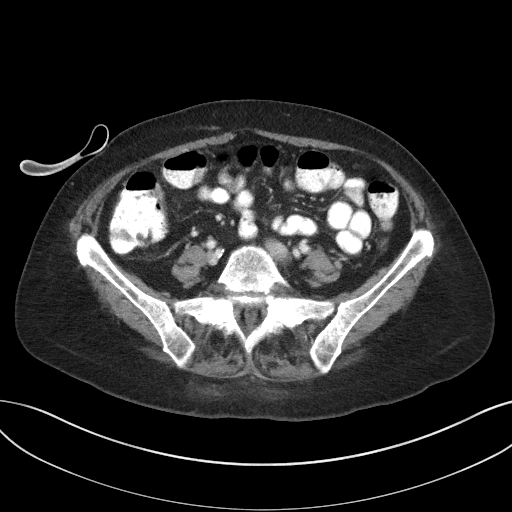
[im 46/92  soft-tissue]
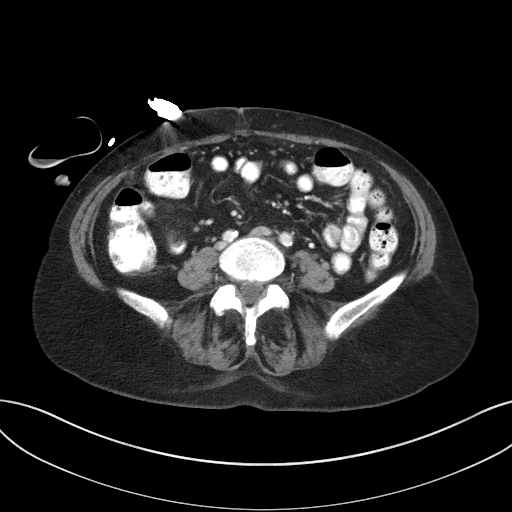
[im 51/92  soft-tissue]
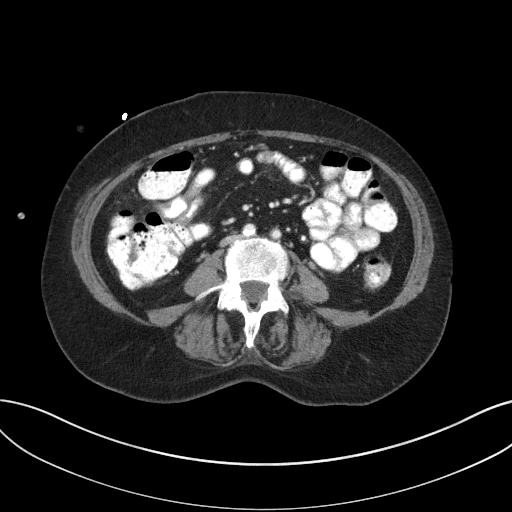
[im 61/92  soft-tissue]
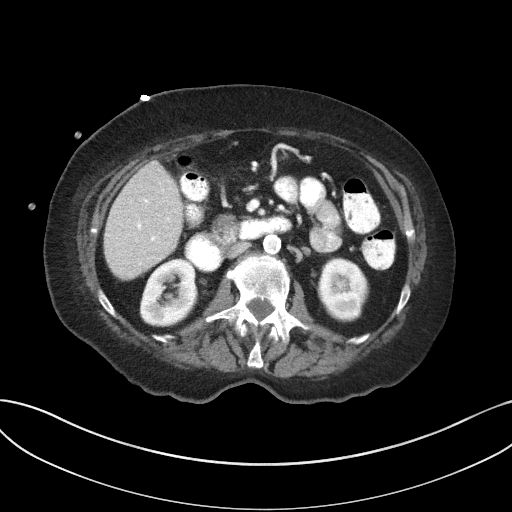
[im 61/92  bone]
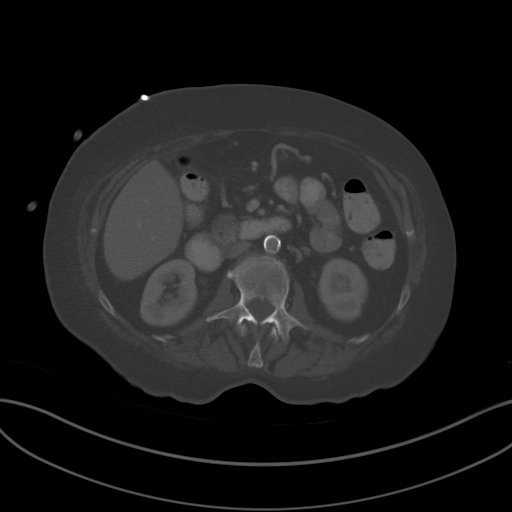
[im 66/92  soft-tissue]
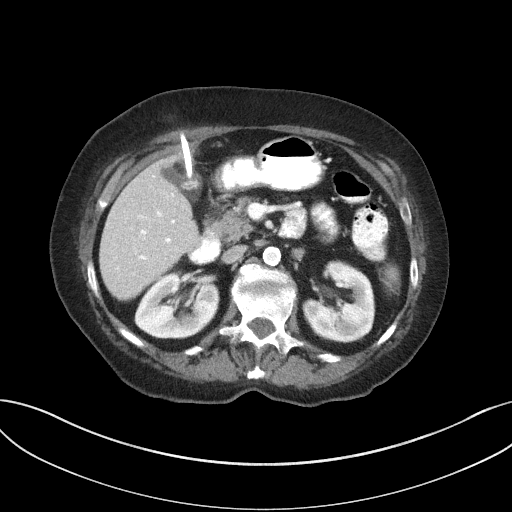
[im 71/92  soft-tissue]
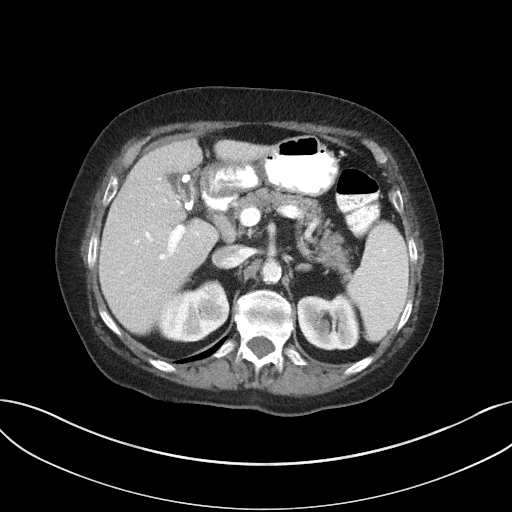
[im 81/92  soft-tissue]
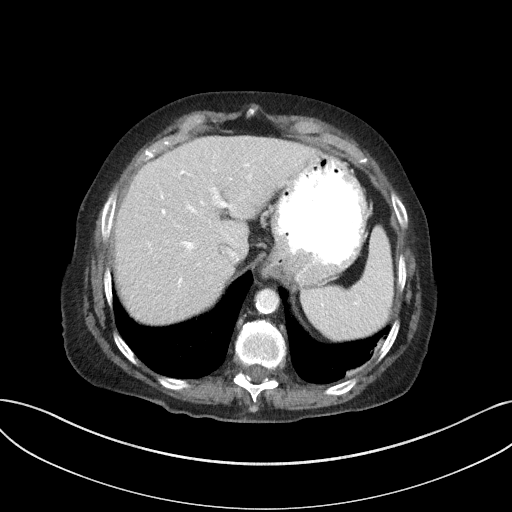
[im 86/92  soft-tissue]
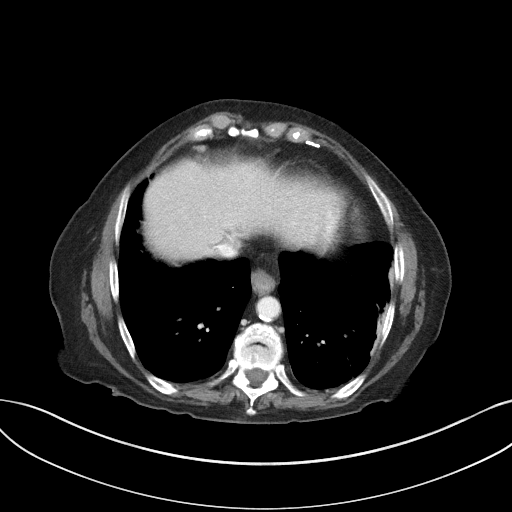

[Series 5: coronal st · coronal · 0.76mm/px · 3 of 87 slices shown]
[im 29/87  soft-tissue]
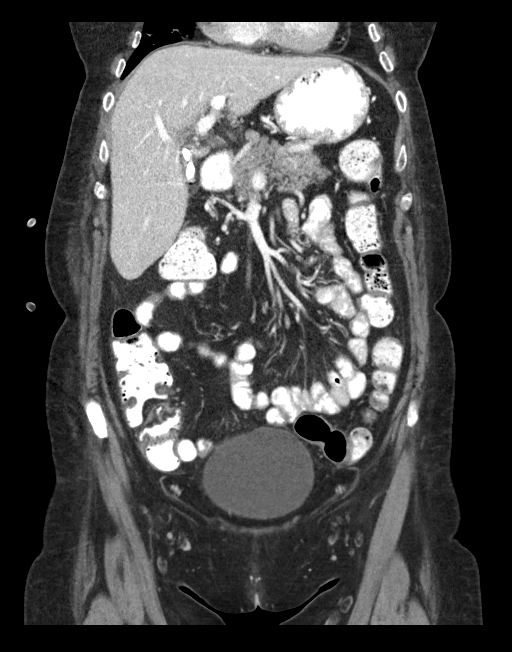
[im 39/87  soft-tissue]
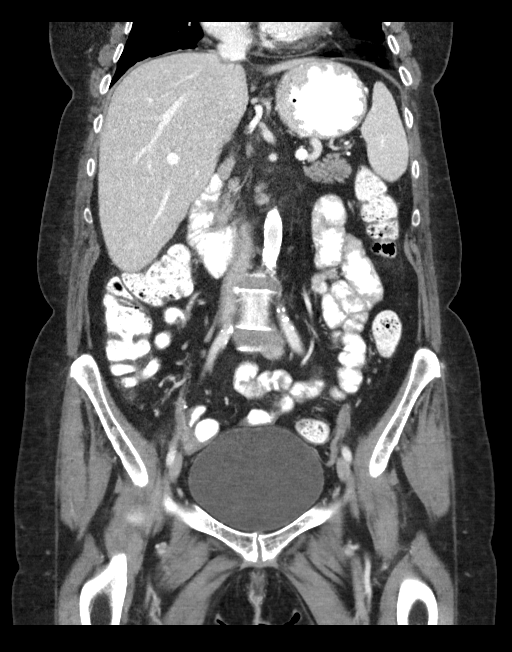
[im 48/87  soft-tissue]
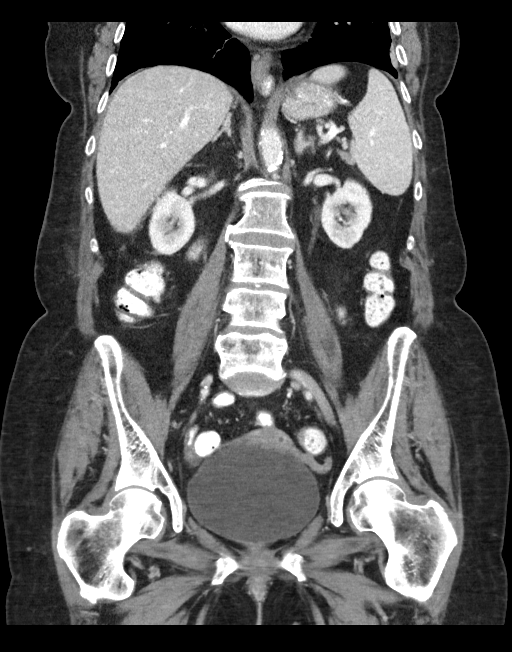

[16 of 46 positions shown; findings below may reference images not displayed]

FINDINGS: Lower chest: Linear left lower lobe pleural thickening.

Hepatobiliary: The liver appears normal. Post placement of
percutaneous cholecystostomy. The catheter is within the
decompressed gallbladder lumen. There is moderate pericholecystic
edema.

Pancreas: Unremarkable. No pancreatic ductal dilatation or
surrounding inflammatory changes.

Spleen: Normal in size without focal abnormality.

Adrenals/Urinary Tract: Adrenal glands are unremarkable. Kidneys are
normal, without renal calculi, focal lesion, or hydronephrosis.
Bladder is unremarkable.

Stomach/Bowel: Stomach is within normal limits. Appendix appears
normal. No evidence of bowel wall thickening, distention, or
inflammatory changes.

Vascular/Lymphatic: Aortic atherosclerosis. No enlarged abdominal or
pelvic lymph nodes.

Reproductive: Uterus and bilateral adnexa are unremarkable.

Other: No abdominal wall hernia or abnormality. No abdominopelvic
ascites.

Musculoskeletal: No acute or significant osseous findings.
IMPRESSION: 1. Post placement of percutaneous cholecystostomy. The catheter is
within the decompressed gallbladder lumen. Moderate pericholecystic
edema.
2. No evidence of acute abnormalities within the solid abdominal
organs.

Aortic Atherosclerosis ([id number]-290.0).

## 2021-04-14 ENCOUNTER — Other Ambulatory Visit: Payer: Self-pay | Admitting: Urology

## 2021-08-04 ENCOUNTER — Other Ambulatory Visit: Payer: Medicare PPO | Admitting: Urology

## 2021-08-08 ENCOUNTER — Ambulatory Visit: Payer: Medicare PPO | Admitting: Urology

## 2021-08-08 ENCOUNTER — Encounter: Payer: Self-pay | Admitting: Urology

## 2021-08-08 ENCOUNTER — Other Ambulatory Visit: Payer: Self-pay

## 2021-08-08 VITALS — BP 120/70 | HR 78 | Ht 65.0 in | Wt 140.0 lb

## 2021-08-08 DIAGNOSIS — Z8603 Personal history of neoplasm of uncertain behavior: Secondary | ICD-10-CM | POA: Diagnosis not present

## 2021-08-08 LAB — URINALYSIS, COMPLETE
Bilirubin, UA: NEGATIVE
Glucose, UA: NEGATIVE
Ketones, UA: NEGATIVE
Leukocytes,UA: NEGATIVE
Nitrite, UA: NEGATIVE
Protein,UA: NEGATIVE
Specific Gravity, UA: 1.01 (ref 1.005–1.030)
Urobilinogen, Ur: 0.2 mg/dL (ref 0.2–1.0)
pH, UA: 5.5 (ref 5.0–7.5)

## 2021-08-08 LAB — MICROSCOPIC EXAMINATION

## 2021-08-08 MED ORDER — LIDOCAINE HCL URETHRAL/MUCOSAL 2 % EX GEL
1.0000 "application " | Freq: Once | CUTANEOUS | Status: AC
  Administered 2021-08-08: 1 via URETHRAL

## 2021-08-08 NOTE — Progress Notes (Signed)
Bladder cancer surveillance note   Initial Diagnosis of Bladder  Year: 12/2013 Pathology: HG T1   Recurrent Bladder Cancer Diagnosis   Date                 Pathology 11/2015             CIS     Treatments for Bladder Cancer 12/2013: TURBT HG T1, Induction BCG 11/2015: TURBT CIS 11/2015 Repeat induction BCG 06/2016 mBCG 10/2016: mBCG 06/2017: mBCG   AUA Risk Category High   Cystoscopy Procedure Note:   After informed consent and discussion of the procedure and its risks, Adriana Anderson was positioned and prepped in the standard fashion. Cystoscopy was performed with the a flexible cystoscope. The urethra, bladder neck and entire bladder was visualized in a standard fashion, and mucosa grossly normal. The ureteral orifices were visualized in their normal location and orientation. Retroflexion normal.  Cytology sent.   CT urogram 02/14/2019 reviewed, NED   PLAN: No recurrence RTC 1 year cystoscopy, she opts to defer further imaging at this time which is reasonable  Nickolas Madrid, MD 08/08/2021

## 2021-08-09 LAB — CYTOLOGY - NON PAP

## 2021-08-30 ENCOUNTER — Other Ambulatory Visit: Payer: Self-pay | Admitting: Urology

## 2021-08-30 ENCOUNTER — Telehealth: Payer: Self-pay | Admitting: *Deleted

## 2021-08-30 NOTE — Telephone Encounter (Signed)
error 

## 2021-11-02 ENCOUNTER — Other Ambulatory Visit (HOSPITAL_COMMUNITY): Payer: Self-pay | Admitting: Neurosurgery

## 2021-11-02 ENCOUNTER — Other Ambulatory Visit: Payer: Self-pay | Admitting: Neurosurgery

## 2021-11-02 DIAGNOSIS — M5412 Radiculopathy, cervical region: Secondary | ICD-10-CM

## 2021-11-03 ENCOUNTER — Other Ambulatory Visit: Payer: Self-pay | Admitting: Nurse Practitioner

## 2021-11-03 DIAGNOSIS — K219 Gastro-esophageal reflux disease without esophagitis: Secondary | ICD-10-CM

## 2021-11-03 DIAGNOSIS — R17 Unspecified jaundice: Secondary | ICD-10-CM

## 2021-11-03 DIAGNOSIS — R110 Nausea: Secondary | ICD-10-CM

## 2021-11-11 ENCOUNTER — Ambulatory Visit
Admission: RE | Admit: 2021-11-11 | Discharge: 2021-11-11 | Disposition: A | Discharge status: Home / Self Care | Payer: Medicare PPO | Source: Ambulatory Visit | Attending: Nurse Practitioner | Admitting: Nurse Practitioner

## 2021-11-11 DIAGNOSIS — R17 Unspecified jaundice: Secondary | ICD-10-CM | POA: Insufficient documentation

## 2021-11-11 DIAGNOSIS — K219 Gastro-esophageal reflux disease without esophagitis: Secondary | ICD-10-CM | POA: Diagnosis present

## 2021-11-11 DIAGNOSIS — R110 Nausea: Secondary | ICD-10-CM | POA: Insufficient documentation

## 2021-12-20 ENCOUNTER — Other Ambulatory Visit: Payer: Self-pay | Admitting: Urology

## 2022-05-16 ENCOUNTER — Other Ambulatory Visit: Payer: Self-pay | Admitting: Urology

## 2022-05-16 DIAGNOSIS — N3281 Overactive bladder: Secondary | ICD-10-CM

## 2022-06-12 ENCOUNTER — Encounter: Payer: Self-pay | Admitting: Physician Assistant

## 2022-06-12 ENCOUNTER — Ambulatory Visit: Payer: Medicare PPO | Admitting: Physician Assistant

## 2022-06-12 VITALS — BP 159/77 | HR 90 | Ht 65.0 in | Wt 145.0 lb

## 2022-06-12 DIAGNOSIS — R300 Dysuria: Secondary | ICD-10-CM

## 2022-06-12 LAB — URINALYSIS, COMPLETE
Bilirubin, UA: NEGATIVE
Glucose, UA: NEGATIVE
Ketones, UA: NEGATIVE
Nitrite, UA: NEGATIVE
Protein,UA: NEGATIVE
Specific Gravity, UA: <1.005 — ABNORMAL LOW (ref 1.005–1.030)
Urobilinogen, Ur: 0.2 mg/dL (ref 0.2–1.0)
pH, UA: 5 (ref 5.0–7.5)

## 2022-06-12 LAB — MICROSCOPIC EXAMINATION: WBC, UA: >30 /hpf — AB (ref 0–5)

## 2022-06-12 MED ORDER — CIPROFLOXACIN HCL 250 MG PO TABS: 250.0000 mg | ORAL_TABLET | Freq: Two times a day (BID) | ORAL | 0 refills | Status: AC

## 2022-06-12 NOTE — Progress Notes (Signed)
06/12/2022 1:25 PM   Linus Salmons 1941-04-18 [id number]  CC: Chief Complaint  Patient presents with   Dysuria   HPI: Adriana Anderson is a 81 y.o. female with PMH bladder cancer and recurrent UTI on suppressive Macrobid 100 mg daily who presents today for evaluation of possible acute cystitis.   Today she reports an approximate 4-day history of dysuria, suprapubic pain, and gross hematuria.  She reports subjective fevers but has not been taking her temperature at home.  She has had some nausea/vomiting, though these are common for her.  No known history of kidney stones and she denies acute flank pain.  In-office UA today positive for 2+ blood and 2+ leukocytes; urine microscopy with >30 WBCs/HPF, 11-30 RBCs/HPF, and many bacteria.   PMH: Past Medical History:  Diagnosis Date   A-fib ([place])    Acid reflux    Anginal pain (H[facility])    Bladder cancer ([facility])    Bladder mass    Coronary artery disease    COVID-19 09/11/2019   test [facility]   Dyspnea    GERD (gastroesophageal reflux disease)    History of biopsy of bladder    History of cystoscopy    HLD (hyperlipidemia)    Hypertension    Liver disease    Motion sickness    boats   PONV (postoperative nausea and vomiting)    Pre-diabetes     Surgical History: Past Surgical History:  Procedure Laterality Date   APPENDECTOMY     CATARACT EXTRACTION W/PHACO Right 12/10/2019   Procedure: CATARACT EXTRACTION PHACO AND INTRAOCULAR LENS PLACEMENT (IOC) RIGHT 5.43 00:53.3 10.2%;  Surgeon: Leandrew Koyanagi, MD;  Location: [place];  Service: Ophthalmology;  Laterality: Right;  has to go day before surgery for covid testing per PAT testing   CORONARY ARTERY BYPASS GRAFT  03/07/2017   3 vessel - [facility]   CYSTOSCOPY WITH BIOPSY N/A 11/15/2015   Procedure: CYSTOSCOPY WITH BIOPSY;  Surgeon: [name], MD;  Location: [facility];  Service: Urology;  Laterality: N/A;   INTRAOPERATIVE CHOLANGIOGRAM  08/12/2019    Procedure: INTRAOPERATIVE CHOLANGIOGRAM;  Surgeon: Jules Husbands, MD;  Location: [facility];  Service: General;;   IR PERC CHOLECYSTOSTOMY  05/23/2019   LEFT HEART CATH AND CORONARY ANGIOGRAPHY Left 02/23/2017   Procedure: Left Heart Cath and Coronary Angiography;  Surgeon: Isaias Cowman, MD;  Location: [facility];  Service: Cardiovascular;  Laterality: Left;   TOE SURGERY      Home Medications:  Allergies as of 06/12/2022       Reactions   Tetanus-diphth-acell Pertussis Swelling   Sulfa Antibiotics Rash        Medication List        Accurate as of June 12, 2022  1:25 PM. If you have any questions, ask your nurse or doctor.          apixaban 5 MG Tabs tablet Commonly known as: ELIQUIS Take 5 mg by mouth 2 (two) times daily.   aspirin EC 81 MG tablet Take 81 mg by mouth at bedtime.   atorvastatin 40 MG tablet Commonly known as: LIPITOR Take 1 tablet by mouth daily.   CALTRATE 600+D PO Take 1 tablet by mouth daily.   estradiol 0.1 MG/GM vaginal cream Commonly known as: ESTRACE APPLY A PEA SIZED AMOUNT TO TIP OF FINGER TO URETHRA BEFORE BED. [place] HANDS WELL AFTER APPLICATION   isosorbide mononitrate 30 MG 24 hr tablet Commonly known as: IMDUR Take  30 mg by mouth daily.   loratadine 10 MG tablet Commonly known as: CLARITIN Take 10 mg by mouth daily as needed for allergies.   metoprolol succinate 25 MG 24 hr tablet Commonly known as: TOPROL-XL Take 37.5 mg by mouth 2 (two) times daily.   nitrofurantoin (macrocrystal-monohydrate) 100 MG capsule Commonly known as: MACROBID TAKE 1 CAPSULE BY MOUTH EVERY DAY   nitroGLYCERIN 2.5 MG CR capsule Take 2.5 mg by mouth daily.   OCUVITE PRESERVISION PO Take 1 tablet by mouth daily.   pantoprazole 40 MG tablet Commonly known as: PROTONIX Take 40 mg by mouth daily.   solifenacin 10 MG tablet Commonly known as: VESICARE TAKE 1 TABLET BY MOUTH EVERY DAY        Allergies:  Allergies   Allergen Reactions   Tetanus-Diphth-Acell Pertussis Swelling   Sulfa Antibiotics Rash    Family History: Family History  Problem Relation Age of Onset   Bladder Cancer Brother        x 3    Brain cancer Father    Kidney disease Father    Diabetes Other    Heart attack Mother    Heart failure Sister    Bladder Cancer Brother    Prostate cancer Neg Hx    Kidney cancer Neg Hx     Social History:   reports that she quit smoking about 40 years ago. Her smoking use included cigarettes. She has never used smokeless tobacco. She reports current alcohol use. She reports that she does not use drugs.  Physical Exam: BP (!) 159/77   Pulse 90   Ht '5\' 5"'$  (1.651 m)   Wt 145 lb (65.8 kg)   BMI 24.13 kg/m   Constitutional:  Alert and oriented, no acute distress, nontoxic appearing HEENT: [place], AT Cardiovascular: No clubbing, cyanosis, or edema Respiratory: Normal respiratory effort, no increased work of breathing Skin: No rashes, bruises or suspicious lesions Neurologic: Grossly intact, no focal deficits, moving all 4 extremities Psychiatric: Normal mood and affect  Laboratory Data: Results for orders placed or performed in visit on 06/12/22  CULTURE, URINE COMPREHENSIVE   Specimen: Urine   UR  Result Value Ref Range   Urine Culture, Comprehensive Final report (A)    Organism ID, Bacteria Comment (A)    ANTIMICROBIAL SUSCEPTIBILITY Comment   Microscopic Examination   Urine  Result Value Ref Range   WBC, UA >30 (A) 0 - 5 /hpf   RBC, Urine 11-30 (A) 0 - 2 /hpf   Epithelial Cells (non renal) 0-10 0 - 10 /hpf   Bacteria, UA Many (A) None seen/Few  Urinalysis, Complete  Result Value Ref Range   Specific Gravity, UA <1.005 (L) 1.005 - 1.030   pH, UA 5.0 5.0 - 7.5   Color, UA Yellow Yellow   Appearance Ur Hazy (A) Clear   Leukocytes,UA 2+ (A) Negative   Protein,UA Negative Negative/Trace   Glucose, UA Negative Negative   Ketones, UA Negative Negative   RBC, UA 2+ (A) Negative    Bilirubin, UA Negative Negative   Urobilinogen, Ur 0.2 0.2 - 1.0 mg/dL   Nitrite, UA Negative Negative   Microscopic Examination See below:    Assessment & Plan:   1. Dysuria UA appears grossly infected today, though VSS in clinic today.  Will start empiric Cipro and send for culture for further evaluation.  I counseled her to stop suppressive Macrobid while taking Cipro and resume it upon completion.  We will plan for lab visit with repeat  UA in 1 week to prove resolution of hematuria.  If her hematuria persists despite culture appropriate antibiotics, will plan for CT urogram for further evaluation.  Otherwise, okay to defer cross-sectional imaging per Dr. Doristine Counter prior note. - Urinalysis, Complete - CULTURE, URINE COMPREHENSIVE - ciprofloxacin (CIPRO) 250 MG tablet; Take 1 tablet (250 mg total) by mouth 2 (two) times daily for 5 days.  Dispense: 10 tablet; Refill: 0   Return in about 1 week (around 06/19/2022) for Lab visit for UA.  Debroah Loop, PA-C  [facility] [address] [place], [place] [address] [phone]

## 2022-06-15 ENCOUNTER — Other Ambulatory Visit: Payer: Self-pay | Admitting: Family Medicine

## 2022-06-15 DIAGNOSIS — R300 Dysuria: Secondary | ICD-10-CM

## 2022-06-15 LAB — CULTURE, URINE COMPREHENSIVE

## 2022-06-21 ENCOUNTER — Other Ambulatory Visit: Payer: Medicare PPO

## 2022-06-21 ENCOUNTER — Telehealth: Payer: Self-pay

## 2022-06-21 DIAGNOSIS — R300 Dysuria: Secondary | ICD-10-CM

## 2022-06-21 LAB — URINALYSIS, COMPLETE
Bilirubin, UA: NEGATIVE
Glucose, UA: NEGATIVE
Ketones, UA: NEGATIVE
Nitrite, UA: NEGATIVE
Protein,UA: NEGATIVE
Specific Gravity, UA: <1.005 — ABNORMAL LOW (ref 1.005–1.030)
Urobilinogen, Ur: 0.2 mg/dL (ref 0.2–1.0)
pH, UA: 5.5 (ref 5.0–7.5)

## 2022-06-21 LAB — MICROSCOPIC EXAMINATION: WBC, UA: >30 /hpf — AB (ref 0–5)

## 2022-06-21 NOTE — Telephone Encounter (Signed)
Received her UA results per Sam sent this out for a culture , called informed pt, she said that she wasn't have any symptoms  and pain has  went away. Advised pt we are waiting on the results of culture and will call her if anything grows.

## 2022-06-24 LAB — CULTURE, URINE COMPREHENSIVE

## 2022-07-03 ENCOUNTER — Other Ambulatory Visit: Payer: Self-pay | Admitting: Physician Assistant

## 2022-07-03 DIAGNOSIS — R300 Dysuria: Secondary | ICD-10-CM

## 2022-07-03 MED ORDER — AMOXICILLIN-POT CLAVULANATE 875-125 MG PO TABS: 1.0000 | ORAL_TABLET | Freq: Two times a day (BID) | ORAL | 0 refills | Status: AC

## 2022-07-13 ENCOUNTER — Other Ambulatory Visit: Payer: Self-pay | Admitting: *Deleted

## 2022-07-13 DIAGNOSIS — Z8603 Personal history of neoplasm of uncertain behavior: Secondary | ICD-10-CM

## 2022-07-13 DIAGNOSIS — R300 Dysuria: Secondary | ICD-10-CM

## 2022-07-14 ENCOUNTER — Other Ambulatory Visit: Payer: Medicare PPO

## 2022-07-14 DIAGNOSIS — Z8603 Personal history of neoplasm of uncertain behavior: Secondary | ICD-10-CM

## 2022-07-14 DIAGNOSIS — R300 Dysuria: Secondary | ICD-10-CM

## 2022-07-14 LAB — URINALYSIS, COMPLETE
Bilirubin, UA: NEGATIVE
Glucose, UA: NEGATIVE
Ketones, UA: NEGATIVE
Nitrite, UA: POSITIVE — AB
Protein,UA: NEGATIVE
Specific Gravity, UA: 1.005 (ref 1.005–1.030)
Urobilinogen, Ur: 0.2 mg/dL (ref 0.2–1.0)
pH, UA: 5.5 (ref 5.0–7.5)

## 2022-07-14 LAB — MICROSCOPIC EXAMINATION: WBC, UA: >30 /hpf — AB (ref 0–5)

## 2022-07-20 LAB — CULTURE, URINE COMPREHENSIVE

## 2022-07-21 ENCOUNTER — Telehealth: Payer: Self-pay | Admitting: Physician Assistant

## 2022-07-21 DIAGNOSIS — N390 Urinary tract infection, site not specified: Secondary | ICD-10-CM

## 2022-07-21 DIAGNOSIS — Z8603 Personal history of neoplasm of uncertain behavior: Secondary | ICD-10-CM

## 2022-07-21 DIAGNOSIS — R3129 Other microscopic hematuria: Secondary | ICD-10-CM

## 2022-07-21 MED ORDER — CIPROFLOXACIN HCL 250 MG PO TABS: 250.0000 mg | ORAL_TABLET | Freq: Two times a day (BID) | ORAL | 0 refills | Status: AC

## 2022-07-21 NOTE — Telephone Encounter (Signed)
Spoke with patient and advised results, she will start prescription today.

## 2022-07-21 NOTE — Telephone Encounter (Signed)
Ok, I sent Cipro '250mg'$  BID x5 days to [facility] [address]. I'm also placing an order for a CT urogram to fully evaluate the upper part of her urinary tract for any abnormalities that could be causing these recurrent vs persistent UTIs. The imaging department will call her to schedule and she should keep plans for cysto with Dr. Diamantina Providence early next month.

## 2022-07-21 NOTE — Telephone Encounter (Signed)
Called patient to discuss most recent urine culture results; LMOM to return my call.  Her urine culture is persistently positive despite two rounds of culture appropriate antibiotics, though her microscopic hematuria has resolved. She does not have a history of asymptomatic bacteriuria. Will plan to recheck her symptoms. If asymptomatic, will defer treatment given clearance of MH. If still symptomatic, recommend imaging for further evaluation.

## 2022-07-21 NOTE — Telephone Encounter (Signed)
LM on triage line [phone]- returning Sam's call   Informed pt of Sam's message. Pt states she is feeling a little better today. She is weak and nauseated on/off.   She is experiencing pain with urination described as a dull pain.   Neg  urgency Neg  back pain Neg  fever Neg  blood in ur  Pos for frequency- feels like she is unable to empty.   Pls advise. Thanks.

## 2022-08-02 ENCOUNTER — Ambulatory Visit
Admission: RE | Admit: 2022-08-02 | Discharge: 2022-08-02 | Disposition: A | Discharge status: Home / Self Care | Payer: Medicare PPO | Source: Ambulatory Visit | Attending: Physician Assistant | Admitting: Physician Assistant

## 2022-08-02 DIAGNOSIS — Z8603 Personal history of neoplasm of uncertain behavior: Secondary | ICD-10-CM | POA: Diagnosis present

## 2022-08-02 DIAGNOSIS — R3129 Other microscopic hematuria: Secondary | ICD-10-CM | POA: Insufficient documentation

## 2022-08-02 DIAGNOSIS — N390 Urinary tract infection, site not specified: Secondary | ICD-10-CM | POA: Insufficient documentation

## 2022-08-02 MED ORDER — IOHEXOL 300 MG/ML  SOLN
125.0000 mL | Freq: Once | INTRAMUSCULAR | Status: AC | PRN
  Administered 2022-08-02: 125 mL via INTRAVENOUS

## 2022-08-09 ENCOUNTER — Ambulatory Visit: Payer: Medicare PPO | Admitting: Urology

## 2022-08-09 ENCOUNTER — Encounter: Payer: Self-pay | Admitting: Urology

## 2022-08-09 VITALS — BP 145/74 | HR 81 | Ht 65.0 in | Wt 145.0 lb

## 2022-08-09 DIAGNOSIS — Z8551 Personal history of malignant neoplasm of bladder: Secondary | ICD-10-CM | POA: Diagnosis not present

## 2022-08-09 DIAGNOSIS — C679 Malignant neoplasm of bladder, unspecified: Secondary | ICD-10-CM

## 2022-08-09 NOTE — Progress Notes (Signed)
Bladder cancer surveillance note   Initial Diagnosis of Bladder  Year: 12/2013 Pathology: HG T1   Recurrent Bladder Cancer Diagnosis   Date                 Pathology 11/2015             CIS    Treatments for Bladder Cancer 12/2013: TURBT HG T1, Induction BCG 11/2015: TURBT CIS 11/2015 Repeat induction BCG 06/2016 mBCG 10/2016: mBCG 06/2017: mBCG   AUA Risk Category High   Cystoscopy Procedure Note:   After informed consent and discussion of the procedure and its risks, Adriana Anderson was positioned and prepped in the standard fashion. Cystoscopy was performed with the a flexible cystoscope. The urethra, bladder neck and entire bladder was visualized in a standard fashion, and mucosa grossly normal. The ureteral orifices were visualized in their normal location and orientation. Retroflexion normal.  Cytology sent.   CT urogram 08/03/2022 reviewed, NED   PLAN: No recurrence, follow-up cytology RTC 1 year cystoscopy if cytology negative  Nickolas Madrid, MD 08/09/2022

## 2022-08-09 NOTE — Addendum Note (Signed)
Addended by: Despina Hidden on: 08/09/2022 03:38 PM   Modules accepted: Orders

## 2022-08-11 LAB — CYTOLOGY - NON PAP

## 2022-08-16 ENCOUNTER — Telehealth: Payer: Self-pay

## 2022-08-16 NOTE — Telephone Encounter (Signed)
-----   Message from Billey Co, MD sent at 08/15/2022  8:09 AM EST ----- Cytology negative, schedule 1 year follow up for cysto  Nickolas Madrid, MD 08/15/2022

## 2022-08-16 NOTE — Telephone Encounter (Signed)
Called pt informed her of the information below. Pt voiced understanding. Appt scheduled.

## 2022-09-11 ENCOUNTER — Other Ambulatory Visit: Payer: Self-pay | Admitting: Urology

## 2022-09-14 ENCOUNTER — Other Ambulatory Visit: Payer: Self-pay | Admitting: Urology

## 2022-09-16 ENCOUNTER — Other Ambulatory Visit: Payer: Self-pay | Admitting: Urology

## 2022-09-22 ENCOUNTER — Other Ambulatory Visit: Payer: Self-pay | Admitting: Urology

## 2022-09-30 ENCOUNTER — Other Ambulatory Visit: Payer: Self-pay | Admitting: Urology

## 2022-10-24 ENCOUNTER — Other Ambulatory Visit: Payer: Self-pay | Admitting: Nurse Practitioner

## 2022-10-24 DIAGNOSIS — K219 Gastro-esophageal reflux disease without esophagitis: Secondary | ICD-10-CM

## 2022-11-03 ENCOUNTER — Ambulatory Visit
Admission: RE | Admit: 2022-11-03 | Discharge: 2022-11-03 | Disposition: A | Discharge status: Home / Self Care | Payer: Medicare PPO | Source: Ambulatory Visit | Attending: Nurse Practitioner | Admitting: Nurse Practitioner

## 2022-11-03 DIAGNOSIS — K219 Gastro-esophageal reflux disease without esophagitis: Secondary | ICD-10-CM

## 2023-03-05 ENCOUNTER — Encounter: Payer: Self-pay | Admitting: Physician Assistant

## 2023-03-05 ENCOUNTER — Ambulatory Visit: Payer: Medicare PPO | Admitting: Physician Assistant

## 2023-03-05 VITALS — BP 133/74 | HR 87 | Ht 65.0 in | Wt 139.0 lb

## 2023-03-05 DIAGNOSIS — R300 Dysuria: Secondary | ICD-10-CM | POA: Diagnosis not present

## 2023-03-05 LAB — URINALYSIS, COMPLETE
Bilirubin, UA: NEGATIVE
Glucose, UA: NEGATIVE
Ketones, UA: NEGATIVE
Nitrite, UA: NEGATIVE
Protein,UA: NEGATIVE
Specific Gravity, UA: <1.005 — ABNORMAL LOW (ref 1.005–1.030)
Urobilinogen, Ur: 0.2 mg/dL (ref 0.2–1.0)
pH, UA: 6 (ref 5.0–7.5)

## 2023-03-05 LAB — MICROSCOPIC EXAMINATION: WBC, UA: >30 /hpf — AB (ref 0–5)

## 2023-03-05 MED ORDER — CIPROFLOXACIN HCL 250 MG PO TABS: 250.0000 mg | ORAL_TABLET | Freq: Two times a day (BID) | ORAL | 0 refills | Status: AC

## 2023-03-05 NOTE — Progress Notes (Unsigned)
03/05/2023 4:11 PM   Adriana Anderson Jun 28, 1941 [id number]  CC: Chief Complaint  Patient presents with   Dysuria   HPI: Adriana Anderson is a 82 y.o. female with PMH bladder cancer and recurrent UTI on suppressive Macrobid who presents today for evaluation of possible acute cystitis.   Today she reports an approximate 4-day history of dysuria, urgency, frequency, lower abdominal pain, and subjective fever.  She denies nausea, vomiting, and flank pain.  She is using estrogen cream.  In-office UA today positive for trace intact blood and 3+ leukocytes; urine microscopy with >30 WBCs/HPF and moderate bacteria.  PMH: Past Medical History:  Diagnosis Date   A-fib (HCC)    Acid reflux    Anginal pain (HCC)    Bladder cancer (HCC)    Bladder mass    Coronary artery disease    COVID-19 09/11/2019   test done at [facility]   Dyspnea    GERD (gastroesophageal reflux disease)    History of biopsy of bladder    History of cystoscopy    HLD (hyperlipidemia)    Hypertension    Liver disease    Motion sickness    boats   PONV (postoperative nausea and vomiting)    Pre-diabetes     Surgical History: Past Surgical History:  Procedure Laterality Date   APPENDECTOMY     CATARACT EXTRACTION W/PHACO Right 12/10/2019   Procedure: CATARACT EXTRACTION PHACO AND INTRAOCULAR LENS PLACEMENT (IOC) RIGHT 5.43 00:53.3 10.2%;  Surgeon: Lockie Mola, MD;  Location: [facility];  Service: Ophthalmology;  Laterality: Right;  has to go day before surgery for covid testing per PAT testing   CORONARY ARTERY BYPASS GRAFT  03/07/2017   3 vessel - [facility]   CYSTOSCOPY WITH BIOPSY N/A 11/15/2015   Procedure: CYSTOSCOPY WITH BIOPSY;  Surgeon: Vanna Scotland, MD;  Location: [facility] ORS;  Service: Urology;  Laterality: N/A;   INTRAOPERATIVE CHOLANGIOGRAM  08/12/2019   Procedure: INTRAOPERATIVE CHOLANGIOGRAM;  Surgeon: Leafy Ro, MD;  Location: [facility] ORS;  Service: General;;   IR PERC  CHOLECYSTOSTOMY  05/23/2019   LEFT HEART CATH AND CORONARY ANGIOGRAPHY Left 02/23/2017   Procedure: Left Heart Cath and Coronary Angiography;  Surgeon: Marcina Millard, MD;  Location: [facility] INVASIVE CV LAB;  Service: Cardiovascular;  Laterality: Left;   TOE SURGERY      Home Medications:  Allergies as of 03/05/2023       Reactions   Tetanus-diphth-acell Pertussis Swelling   Sulfa Antibiotics Rash        Medication List        Accurate as of March 05, 2023  4:11 PM. If you have any questions, ask your nurse or doctor.          apixaban 5 MG Tabs tablet Commonly known as: ELIQUIS Take 5 mg by mouth 2 (two) times daily.   aspirin EC 81 MG tablet Take 81 mg by mouth at bedtime.   atorvastatin 40 MG tablet Commonly known as: LIPITOR Take 1 tablet by mouth daily.   CALTRATE 600+D PO Take 1 tablet by mouth daily.   ciprofloxacin 250 MG tablet Commonly known as: CIPRO Take 1 tablet (250 mg total) by mouth 2 (two) times daily for 5 days. Started by: Carman Ching, PA-C   estradiol 0.1 MG/GM vaginal cream Commonly known as: ESTRACE APPLY A PEA SIZED AMOUNT TO TIP OF FINGER TO URETHRA BEFORE BED. WASH HANDS WELL AFTER APPLICATION   isosorbide mononitrate 30 MG 24 hr tablet Commonly known  as: IMDUR Take 30 mg by mouth daily.   loratadine 10 MG tablet Commonly known as: CLARITIN Take 10 mg by mouth daily as needed for allergies.   metoprolol tartrate 25 MG tablet Commonly known as: LOPRESSOR Take 25 mg by mouth 2 (two) times daily.   nitrofurantoin (macrocrystal-monohydrate) 100 MG capsule Commonly known as: MACROBID TAKE 1 CAPSULE BY MOUTH EVERY DAY   nitroGLYCERIN 2.5 MG CR capsule Take 2.5 mg by mouth daily.   OCUVITE PRESERVISION PO Take 1 tablet by mouth daily.   pantoprazole 40 MG tablet Commonly known as: PROTONIX Take 40 mg by mouth daily.   solifenacin 10 MG tablet Commonly known as: VESICARE TAKE 1 TABLET BY MOUTH EVERY DAY         Allergies:  Allergies  Allergen Reactions   Tetanus-Diphth-Acell Pertussis Swelling   Sulfa Antibiotics Rash    Family History: Family History  Problem Relation Age of Onset   Bladder Cancer Brother        x 3    Brain cancer Father    Kidney disease Father    Diabetes Other    Heart attack Mother    Heart failure Sister    Bladder Cancer Brother    Prostate cancer Neg Hx    Kidney cancer Neg Hx     Social History:   reports that she quit smoking about 40 years ago. Her smoking use included cigarettes. She has been exposed to tobacco smoke. She has never used smokeless tobacco. She reports current alcohol use. She reports that she does not use drugs.  Physical Exam: BP 133/74   Pulse 87   Ht 5\' 5"  (1.651 m)   Wt 139 lb (63 kg)   BMI 23.13 kg/m   Constitutional:  Alert and oriented, no acute distress, nontoxic appearing HEENT: [place], AT Cardiovascular: No clubbing, cyanosis, or edema Respiratory: Normal respiratory effort, no increased work of breathing Lymph: No cervical or inguinal lymphadenopathy Skin: No rashes, bruises or suspicious lesions Neurologic: Grossly intact, no focal deficits, moving all 4 extremities Psychiatric: Normal mood and affect  Laboratory Data: Results for orders placed or performed in visit on 08/09/22  Cytology - Non PAP;  Result Value Ref Range   CYTOLOGY - NON GYN      CYTOLOGY - NON PAP CASE: [id number] PATIENT: Adriana Anderson Non-Gynecological Cytology Report     Specimen Submitted: A. Urine  Clinical History: Malignant neoplasm of urinary bladder, unspecified site (HCC) (C67.9) - Primary      DIAGNOSIS: A.  URINE, INSTRUMENTED (OBTAINED DURING CYSTOSCOPY); CYTOLOGY: - SATISFACTORY FOR EVALUATION (PARIS SYSTEM) - NEGATIVE FOR HIGH-GRADE UROTHELIAL CARCINOMA (SEE COMMENT).  Comment: Specimen consists of abundant bland squamous cells, occasional white blood cells, rare red blood cells and rare urothelial  cells, occasionally in  small clusters as is often seen with instrumented specimens.  GROSS DESCRIPTION: A. Labeled: Labeled with the patient's name and date of birth (per requisition urine, cystoscope) Received: Fresh Volume: Approximately 35 mL Description of fluid and container in which it is received: Received in a clear specimen container with a yellow screw top lid is dark yellow, transparent fluid Specimen  material submitted for: ThinPrep  CM 08/10/2022  Final Diagnosis performed by Alcario Drought, MD.   Electronically signed 08/11/2022 4:15:50PM The electronic signature indicates that the named Attending Pathologist has evaluated the specimen Technical component performed at [place], [address] [place], [place] [address] Lab: [phone] Dir: Jolene Schimke, MD, MMM  Professional component performed at [facility], [facility]  [facility], [address] [place], [place], [place] [address] Lab: [phone] Dir: Beryle Quant, MD    Assessment & Plan:   1. Dysuria UA appears grossly infected today, will start empiric Cipro and send for culture for further evaluation.  We discussed return precautions and I encouraged her to follow-up with me if she does not get better on culture appropriate antibiotics, at which point we may consider repeat cystoscopy given her history of bladder malignancy and CIS. - Urinalysis, Complete - CULTURE, URINE COMPREHENSIVE - ciprofloxacin (CIPRO) 250 MG tablet; Take 1 tablet (250 mg total) by mouth 2 (two) times daily for 5 days.  Dispense: 10 tablet; Refill: 0  Return if symptoms worsen or fail to improve.  Carman Ching, PA-C  [facility] [place] [address], [address] [place], [place] [address] [phone]

## 2023-03-09 LAB — CULTURE, URINE COMPREHENSIVE

## 2023-04-02 ENCOUNTER — Other Ambulatory Visit: Payer: Self-pay | Admitting: Urology

## 2023-04-02 DIAGNOSIS — N3281 Overactive bladder: Secondary | ICD-10-CM

## 2023-05-25 ENCOUNTER — Encounter: Payer: Self-pay | Admitting: Gastroenterology

## 2023-05-31 ENCOUNTER — Ambulatory Visit: Payer: Medicare PPO | Admitting: Registered Nurse

## 2023-05-31 ENCOUNTER — Ambulatory Visit
Admission: RE | Admit: 2023-05-31 | Discharge: 2023-05-31 | Disposition: A | Discharge status: Home / Self Care | Payer: Medicare PPO | Attending: Gastroenterology | Admitting: Gastroenterology

## 2023-05-31 ENCOUNTER — Encounter
Admission: RE | Disposition: A | Discharge status: Home / Self Care | Payer: Self-pay | Source: Home / Self Care | Attending: Gastroenterology

## 2023-05-31 ENCOUNTER — Other Ambulatory Visit: Payer: Self-pay

## 2023-05-31 ENCOUNTER — Encounter: Payer: Self-pay | Admitting: Gastroenterology

## 2023-05-31 DIAGNOSIS — R933 Abnormal findings on diagnostic imaging of other parts of digestive tract: Secondary | ICD-10-CM | POA: Diagnosis not present

## 2023-05-31 DIAGNOSIS — K2970 Gastritis, unspecified, without bleeding: Secondary | ICD-10-CM | POA: Insufficient documentation

## 2023-05-31 DIAGNOSIS — I10 Essential (primary) hypertension: Secondary | ICD-10-CM | POA: Diagnosis not present

## 2023-05-31 DIAGNOSIS — I2510 Atherosclerotic heart disease of native coronary artery without angina pectoris: Secondary | ICD-10-CM | POA: Insufficient documentation

## 2023-05-31 DIAGNOSIS — Z951 Presence of aortocoronary bypass graft: Secondary | ICD-10-CM | POA: Insufficient documentation

## 2023-05-31 DIAGNOSIS — K224 Dyskinesia of esophagus: Secondary | ICD-10-CM | POA: Insufficient documentation

## 2023-05-31 DIAGNOSIS — R634 Abnormal weight loss: Secondary | ICD-10-CM | POA: Diagnosis present

## 2023-05-31 DIAGNOSIS — K449 Diaphragmatic hernia without obstruction or gangrene: Secondary | ICD-10-CM | POA: Insufficient documentation

## 2023-05-31 DIAGNOSIS — Z7901 Long term (current) use of anticoagulants: Secondary | ICD-10-CM | POA: Insufficient documentation

## 2023-05-31 DIAGNOSIS — K219 Gastro-esophageal reflux disease without esophagitis: Secondary | ICD-10-CM | POA: Diagnosis not present

## 2023-05-31 DIAGNOSIS — K319 Disease of stomach and duodenum, unspecified: Secondary | ICD-10-CM | POA: Insufficient documentation

## 2023-05-31 DIAGNOSIS — K921 Melena: Secondary | ICD-10-CM | POA: Insufficient documentation

## 2023-05-31 DIAGNOSIS — E785 Hyperlipidemia, unspecified: Secondary | ICD-10-CM | POA: Diagnosis not present

## 2023-05-31 DIAGNOSIS — I4891 Unspecified atrial fibrillation: Secondary | ICD-10-CM | POA: Diagnosis not present

## 2023-05-31 DIAGNOSIS — Z8551 Personal history of malignant neoplasm of bladder: Secondary | ICD-10-CM | POA: Insufficient documentation

## 2023-05-31 HISTORY — PX: ESOPHAGOGASTRODUODENOSCOPY: SHX5428

## 2023-05-31 HISTORY — PX: BIOPSY: SHX5522

## 2023-05-31 HISTORY — PX: MALONEY DILATION: SHX5535

## 2023-05-31 SURGERY — EGD (ESOPHAGOGASTRODUODENOSCOPY)
Anesthesia: General

## 2023-05-31 MED ORDER — PROPOFOL 10 MG/ML IV BOLUS
INTRAVENOUS | Status: DC | PRN
Start: 2023-05-31 — End: 2023-05-31
  Administered 2023-05-31: 40 mg via INTRAVENOUS
  Administered 2023-05-31 (×2): 30 mg via INTRAVENOUS
  Administered 2023-05-31: 80 mg via INTRAVENOUS
  Administered 2023-05-31: 20 mg via INTRAVENOUS

## 2023-05-31 MED ORDER — LIDOCAINE HCL (PF) 2 % IJ SOLN
INTRAMUSCULAR | Status: AC
  Filled 2023-05-31: qty 5

## 2023-05-31 MED ORDER — LIDOCAINE HCL (CARDIAC) PF 100 MG/5ML IV SOSY
PREFILLED_SYRINGE | INTRAVENOUS | Status: DC | PRN
  Administered 2023-05-31: 100 mg via INTRAVENOUS

## 2023-05-31 MED ORDER — SODIUM CHLORIDE 0.9 % IV SOLN: INTRAVENOUS | Status: DC

## 2023-05-31 MED ORDER — PROPOFOL 10 MG/ML IV BOLUS
INTRAVENOUS | Status: AC
  Filled 2023-05-31: qty 20

## 2023-05-31 NOTE — Op Note (Signed)
[facility] Patient Name: Adriana Anderson Procedure Date: 05/31/2023 11:27 AM MRN: [id number] Account #: [id number] Date of Birth: [birth year] Admit Type: Outpatient Age: 82 Room: [facility] ENDO ROOM 1 Gender: Female Note Status: Finalized Instrument Name: Upper Endoscope [id number] Procedure:             Upper GI endoscopy Indications:           Pernicious anemia, Dysphagia Providers:             Trenda Moots, DO Referring MD:          Jaynie Collins DO, DO (Referring MD), Gracelyn Nurse, MD (Referring MD) Medicines:             Monitored Anesthesia Care Complications:         No immediate complications. Estimated blood loss:                         Minimal. Procedure:             Pre-Anesthesia Assessment:                        - Prior to the procedure, a History and Physical was                         performed, and patient medications and allergies were                         reviewed. The patient is competent. The risks and                         benefits of the procedure and the sedation options and                         risks were discussed with the patient. All questions                         were answered and informed consent was obtained.                         Patient identification and proposed procedure were                         verified by the physician, the nurse, the anesthetist                         and the technician in the endoscopy suite. Mental                         Status Examination: alert and oriented. Airway                         Examination: normal oropharyngeal airway and neck                         mobility. Respiratory Examination: clear to  auscultation. CV Examination: RRR, no murmurs, no S3                         or S4. Prophylactic Antibiotics: The patient does not                         require prophylactic antibiotics. Prior                          Anticoagulants: The patient has taken Eliquis                         (apixaban), last dose was 2 days prior to procedure.                         ASA Grade Assessment: III - A patient with severe                         systemic disease. After reviewing the risks and                         benefits, the patient was deemed in satisfactory                         condition to undergo the procedure. The anesthesia                         plan was to use monitored anesthesia care (MAC).                         Immediately prior to administration of medications,                         the patient was re-assessed for adequacy to receive                         sedatives. The heart rate, respiratory rate, oxygen                         saturations, blood pressure, adequacy of pulmonary                         ventilation, and response to care were monitored                         throughout the procedure. The physical status of the                         patient was re-assessed after the procedure.                        After obtaining informed consent, the endoscope was                         passed under direct vision. Throughout the procedure,                         the patient's blood pressure, pulse, and oxygen  saturations were monitored continuously. The Endoscope                         was introduced through the mouth, and advanced to the                         second part of duodenum. The upper GI endoscopy was                         accomplished without difficulty. The patient tolerated                         the procedure well. Findings:      The duodenal bulb, first portion of the duodenum and second portion of       the duodenum were normal. Biopsies for histology were taken with a cold       forceps for evaluation of celiac disease. Estimated blood loss was       minimal.      A small hiatal hernia was present. Estimated blood loss:  none.      Localized moderate inflammation characterized by erythema and       granularity was found in the gastric antrum. Biopsies were taken with a       cold forceps for Helicobacter pylori testing. Antral and body biopsies       placed in separate jars. Estimated blood loss was minimal.      Abnormal motility was noted in the esophagus. The cricopharyngeus was       normal. There is spasticity of the esophageal body. The distal       esophagus/lower esophageal sphincter is open. Tertiary peristaltic waves       are noted. The scope was withdrawn. Dilation was performed with a       Maloney dilator with no resistance at 48 Fr. The dilation site was       examined following endoscope reinsertion and showed mild mucosal       disruption. Estimated blood loss was minimal.      The exam of the esophagus was otherwise normal.      No outright stricture or stenosis was appreciated. The esophagus was       quite spastic causing areas to narrow, but opened easily with gentle       pressure from the scope. Impression:            - Normal duodenal bulb, first portion of the duodenum                         and second portion of the duodenum. Biopsied.                        - Small hiatal hernia.                        - Gastritis. Biopsied.                        - Abnormal esophageal motility, consistent with                         presbyesophagus. Dilated. Recommendation:        - Patient has a contact number  available for                         emergencies. The signs and symptoms of potential                         delayed complications were discussed with the patient.                         Return to normal activities tomorrow. Written                         discharge instructions were provided to the patient.                        - Discharge patient to home.                        - Soft diet daily.                        - Continue present medications.                        -  No ibuprofen, naproxen, or other non-steroidal                         anti-inflammatory drugs for 5 days.                        - Resume Eliquis (apixaban) at prior dose in 2 days.                         Refer to managing physician for further adjustment of                         therapy.                        - Continue reflux medication.                        Should you have a sore throat- recommend trialing                         chloraseptic drops or sprays. If these do not help                         with discomfort, call the office and we can prescribe                         liquid numbing medication (viscous lidocaine)                        - Return to GI clinic as previously scheduled.                        - The findings and recommendations were discussed with                         the patient. Procedure Code(s):     ---  Professional ---                        [phone], Esophagogastroduodenoscopy, flexible,                         transoral; with biopsy, single or multiple                        43450, Dilation of esophagus, by unguided sound or                         bougie, single or multiple passes Diagnosis Code(s):     --- Professional ---                        K44.9, Diaphragmatic hernia without obstruction or                         gangrene                        K29.70, Gastritis, unspecified, without bleeding                        K22.4, Dyskinesia of esophagus                        D51.0, Vitamin B12 deficiency anemia due to intrinsic                         factor deficiency                        R13.10, Dysphagia, unspecified CPT copyright 2022 American Medical Association. All rights reserved. The codes documented in this report are preliminary and upon coder review may  be revised to meet current compliance requirements. Attending Participation:      I personally performed the entire procedure. Elfredia Nevins, DO Jaynie Collins DO,  DO 05/31/2023 12:38:22 PM This report has been signed electronically. Number of Addenda: 0 Note Initiated On: 05/31/2023 11:27 AM Estimated Blood Loss:  Estimated blood loss was minimal.      [facility]

## 2023-05-31 NOTE — Anesthesia Postprocedure Evaluation (Signed)
Anesthesia Post Note  Patient: Adriana Anderson  Procedure(s) Performed: ESOPHAGOGASTRODUODENOSCOPY (EGD)  Patient location during evaluation: Endoscopy Anesthesia Type: General Level of consciousness: awake and alert Pain management: pain level controlled Vital Signs Assessment: post-procedure vital signs reviewed and stable Respiratory status: spontaneous breathing, nonlabored ventilation, respiratory function stable and patient connected to nasal cannula oxygen Cardiovascular status: blood pressure returned to baseline and stable Postop Assessment: no apparent nausea or vomiting Anesthetic complications: no  No notable events documented.   Last Vitals:  Vitals:   05/31/23 1245 05/31/23 1255  BP: 132/66 (!) 175/74  Pulse: 74 71  Resp: 18 18  Temp:    SpO2: 100% 100%    Last Pain:  Vitals:   05/31/23 1255  TempSrc:   PainSc: 0-No pain                 Stephanie Coup

## 2023-05-31 NOTE — Interval H&P Note (Signed)
History and Physical Interval Note: Preprocedure H&P from 05/31/23  was reviewed and there was no interval change after seeing and examining the patient.  Written consent was obtained from the patient after discussion of risks, benefits, and alternatives. Patient has consented to proceed with Esophagogastroduodenoscopy with possible intervention   05/31/2023 11:44 AM  Adriana Anderson  has presented today for surgery, with the diagnosis of Gastroesophageal reflux disease, unspecified whether esophagitis present (K21.9) Melena (K92.1) Abnormal barium swallow (R93.3) Unintended weight loss (R63.4).  The various methods of treatment have been discussed with the patient and family. After consideration of risks, benefits and other options for treatment, the patient has consented to  Procedure(s): ESOPHAGOGASTRODUODENOSCOPY (EGD) (N/A) as a surgical intervention.  The patient's history has been reviewed, patient examined, no change in status, stable for surgery.  I have reviewed the patient's chart and labs.  Questions were answered to the patient's satisfaction.     Jaynie Collins

## 2023-05-31 NOTE — Transfer of Care (Signed)
Immediate Anesthesia Transfer of Care Note  Patient: Adriana Anderson  Procedure(s) Performed: ESOPHAGOGASTRODUODENOSCOPY (EGD)  Patient Location: Endoscopy Unit  Anesthesia Type:General  Level of Consciousness: drowsy and patient cooperative  Airway & Oxygen Therapy: Patient Spontanous Breathing and Patient connected to face mask oxygen  Post-op Assessment: Report given to RN and Patient moving all extremities X 4  Post vital signs: Reviewed and stable  Last Vitals:  Vitals Value Taken Time  BP 114/49 05/31/23 1235  Temp    Pulse 67 05/31/23 1236  Resp 13 05/31/23 1236  SpO2 96 % 05/31/23 1236  Vitals shown include unfiled device data.  Last Pain:  Vitals:   05/31/23 1108  TempSrc: Temporal  PainSc: 0-No pain         Complications: No notable events documented.

## 2023-05-31 NOTE — H&P (Signed)
Pre-Procedure H&P   Patient ID: Adriana Anderson is a 82 y.o. female.  Gastroenterology Provider: Jaynie Collins, DO  Referring Provider: Fransico Setters, NP PCP: Gracelyn Nurse, MD  Date: 05/31/2023  HPI Ms. Adriana Anderson is a 82 y.o. female who presents today for Esophagogastroduodenoscopy for abnormal barium swallow study, unintentional weight loss, gerd, melena .  Patient underwent barium swallow study demonstrating tertiary contractions and reflux.  13 mm barium tablet was able to move but paused at the GEJ raising concern for mild narrowing.  Moderate to severe dysmotility was noted throughout.  Hiatal hernia was also appreciated on CT in February 2024. She has no odynophagia.  She does note reflux symptoms  Patient on Eliquis which has been held for the procedure.  Appetite is decreased and weight has decreased as well  Hemoglobin distended from 11 to 9.6.  She is macrocytic at 103.  Iron saturation 158 creatinine 1.0 TIBC 240 saturation 44% B12 291 indirect bilirubin elevated  Colonoscopy in 2000 2002 2008 and 2013  S/p chole and appy   Past Medical History:  Diagnosis Date   A-fib (HCC)    Acid reflux    Anginal pain (HCC)    Bladder cancer (HCC)    Bladder mass    Coronary artery disease    COVID-19 09/11/2019   test done at [facility]   Dyspnea    GERD (gastroesophageal reflux disease)    History of biopsy of bladder    History of cystoscopy    HLD (hyperlipidemia)    Hypertension    Liver disease    Motion sickness    boats   PONV (postoperative nausea and vomiting)    Pre-diabetes     Past Surgical History:  Procedure Laterality Date   APPENDECTOMY     CATARACT EXTRACTION W/PHACO Right 12/10/2019   Procedure: CATARACT EXTRACTION PHACO AND INTRAOCULAR LENS PLACEMENT (IOC) RIGHT 5.43 00:53.3 10.2%;  Surgeon: Lockie Mola, MD;  Location: [facility];  Service: Ophthalmology;  Laterality: Right;  has to go day before surgery for  covid testing per PAT testing   CHOLECYSTECTOMY     CORONARY ARTERY BYPASS GRAFT  03/07/2017   3 vessel - [facility]   CYSTOSCOPY WITH BIOPSY N/A 11/15/2015   Procedure: CYSTOSCOPY WITH BIOPSY;  Surgeon: Vanna Scotland, MD;  Location: [facility];  Service: Urology;  Laterality: N/A;   INTRAOPERATIVE CHOLANGIOGRAM  08/12/2019   Procedure: INTRAOPERATIVE CHOLANGIOGRAM;  Surgeon: Leafy Ro, MD;  Location: [facility];  Service: General;;   IR PERC CHOLECYSTOSTOMY  05/23/2019   LEFT HEART CATH AND CORONARY ANGIOGRAPHY Left 02/23/2017   Procedure: Left Heart Cath and Coronary Angiography;  Surgeon: Marcina Millard, MD;  Location: [facility];  Service: Cardiovascular;  Laterality: Left;   TOE SURGERY      Family History No h/o GI disease or malignancy  Review of Systems  Constitutional:  Positive for unexpected weight change. Negative for activity change, appetite change, chills, diaphoresis, fatigue and fever.  HENT:  Positive for trouble swallowing. Negative for voice change.   Respiratory:  Negative for shortness of breath and wheezing.   Cardiovascular:  Negative for chest pain, palpitations and leg swelling.  Gastrointestinal:  Positive for blood in stool (melena). Negative for abdominal distention, abdominal pain, anal bleeding, constipation, diarrhea, nausea, rectal pain and vomiting.  Musculoskeletal:  Negative for arthralgias and myalgias.  Skin:  Negative for color change and pallor.  Neurological:  Negative for dizziness, syncope and weakness.  Psychiatric/Behavioral:  Negative for confusion.   All other systems reviewed and are negative.    Medications No current facility-administered medications on file prior to encounter.   Current Outpatient Medications on File Prior to Encounter  Medication Sig Dispense Refill   aspirin EC 81 MG tablet Take 81 mg by mouth at bedtime.     atorvastatin (LIPITOR) 40 MG tablet Take 1 tablet by mouth daily.     isosorbide  mononitrate (IMDUR) 30 MG 24 hr tablet Take 30 mg by mouth daily.      loratadine (CLARITIN) 10 MG tablet Take 10 mg by mouth daily as needed for allergies.     metoprolol tartrate (LOPRESSOR) 25 MG tablet Take 25 mg by mouth 2 (two) times daily.     nitrofurantoin, macrocrystal-monohydrate, (MACROBID) 100 MG capsule TAKE 1 CAPSULE BY MOUTH EVERY DAY 90 capsule 3   pantoprazole (PROTONIX) 40 MG tablet Take 40 mg by mouth daily.  11   solifenacin (VESICARE) 10 MG tablet TAKE 1 TABLET BY MOUTH EVERY DAY 90 tablet 3   apixaban (ELIQUIS) 5 MG TABS tablet Take 5 mg by mouth 2 (two) times daily.      Calcium Carbonate-Vitamin D (CALTRATE 600+D PO) Take 1 tablet by mouth daily.      estradiol (ESTRACE) 0.1 MG/GM vaginal cream APPLY A PEA SIZED AMOUNT TO TIP OF FINGER TO URETHRA BEFORE BED. WASH HANDS WELL AFTER APPLICATION 42.5 g 3   Multiple Vitamins-Minerals (OCUVITE PRESERVISION PO) Take 1 tablet by mouth daily.     nitroGLYCERIN 2.5 MG CR capsule Take 2.5 mg by mouth daily.       Pertinent medications related to GI and procedure were reviewed by me with the patient prior to the procedure   Current Facility-Administered Medications:    0.9 %  sodium chloride infusion, , Intravenous, Continuous, Jaynie Collins, DO, Last Rate: 20 mL/hr at 05/31/23 1113, New Bag at 05/31/23 1113  sodium chloride 20 mL/hr at 05/31/23 1113       Allergies  Allergen Reactions   Tetanus-Diphth-Acell Pertussis Swelling   Sulfa Antibiotics Rash   Allergies were reviewed by me prior to the procedure  Objective   Body mass index is 23.3 kg/m. Vitals:   05/31/23 1056 05/31/23 1108  BP:  (!) 160/63  Pulse:  73  Resp:  16  Temp:  (!) 96.9 F (36.1 C)  TempSrc:  Temporal  SpO2:  100%  Weight: 63.5 kg 63.5 kg  Height: 5\' 5"  (1.651 m)      Physical Exam Vitals and nursing note reviewed.  Constitutional:      General: She is not in acute distress.    Appearance: Normal appearance. She is not  ill-appearing, toxic-appearing or diaphoretic.  HENT:     Head: Normocephalic and atraumatic.     Nose: Nose normal.     Mouth/Throat:     Mouth: Mucous membranes are moist.     Pharynx: Oropharynx is clear.  Eyes:     General: No scleral icterus.    Extraocular Movements: Extraocular movements intact.  Cardiovascular:     Rate and Rhythm: Normal rate and regular rhythm.     Heart sounds: Normal heart sounds. No murmur heard.    No friction rub. No gallop.  Pulmonary:     Effort: Pulmonary effort is normal. No respiratory distress.     Breath sounds: Normal breath sounds. No wheezing, rhonchi or rales.  Abdominal:     General: Bowel sounds are normal. There  is no distension.     Palpations: Abdomen is soft.     Tenderness: There is no abdominal tenderness. There is no guarding or rebound.  Musculoskeletal:     Cervical back: Neck supple.     Right lower leg: No edema.     Left lower leg: No edema.  Skin:    General: Skin is warm and dry.     Coloration: Skin is not jaundiced or pale.  Neurological:     General: No focal deficit present.     Mental Status: She is alert and oriented to person, place, and time. Mental status is at baseline.  Psychiatric:        Mood and Affect: Mood normal.        Behavior: Behavior normal.        Thought Content: Thought content normal.        Judgment: Judgment normal.      Assessment:  Ms. TSURUE WARGEL is a 82 y.o. female  who presents today for Esophagogastroduodenoscopy for abnormal barium swallow study, unintentional weight loss, gerd, melena .  Plan:  Esophagogastroduodenoscopy with possible intervention today  Esophagogastroduodenoscopy with possible biopsy, control of bleeding, polypectomy, and interventions as necessary has been discussed with the patient/patient representative. Informed consent was obtained from the patient/patient representative after explaining the indication, nature, and risks of the procedure including but  not limited to death, bleeding, perforation, missed neoplasm/lesions, cardiorespiratory compromise, and reaction to medications. Opportunity for questions was given and appropriate answers were provided. Patient/patient representative has verbalized understanding is amenable to undergoing the procedure.   Jaynie Collins, DO  [facility]  Portions of the record may have been created with voice recognition software. Occasional wrong-word or 'sound-a-like' substitutions may have occurred due to the inherent limitations of voice recognition software.  Read the chart carefully and recognize, using context, where substitutions may have occurred.

## 2023-05-31 NOTE — Anesthesia Procedure Notes (Signed)
Procedure Name: General with mask airway Date/Time: 05/31/2023 12:17 PM  Performed by: Lily Lovings, CRNAPre-anesthesia Checklist: Patient identified, Emergency Drugs available, Suction available, Patient being monitored and Timeout performed Patient Re-evaluated:Patient Re-evaluated prior to induction Oxygen Delivery Method: Simple face mask Preoxygenation: Pre-oxygenation with 100% oxygen Induction Type: IV induction

## 2023-05-31 NOTE — Anesthesia Preprocedure Evaluation (Addendum)
Anesthesia Evaluation  Patient identified by MRN, date of birth, ID band Patient awake    Reviewed: Allergy & Precautions, NPO status , Patient's Chart, lab work & pertinent test results, reviewed documented beta blocker date and time   History of Anesthesia Complications (+) PONV and history of anesthetic complications  Airway Mallampati: II       Dental   Pulmonary shortness of breath and with exertion, neg sleep apnea, neg COPD, Not current smoker, former smoker          Cardiovascular hypertension, Pt. on home beta blockers and Pt. on medications + CAD and + CABG  (-) CHF + dysrhythmias Atrial Fibrillation (-) Valvular Problems/Murmurs     Neuro/Psych neg Seizures    GI/Hepatic Neg liver ROS,GERD  Medicated and Controlled,,  Endo/Other  neg diabetes (borderline)    Renal/GU      Musculoskeletal   Abdominal   Peds  Hematology  (+) Blood dyscrasia, anemia   Anesthesia Other Findings   Reproductive/Obstetrics                             Anesthesia Physical Anesthesia Plan  ASA: 3  Anesthesia Plan: General   Post-op Pain Management: Minimal or no pain anticipated   Induction: Intravenous  PONV Risk Score and Plan: 3 and Propofol infusion, TIVA and Ondansetron  Airway Management Planned: Nasal Cannula  Additional Equipment: None  Intra-op Plan:   Post-operative Plan:   Informed Consent: I have reviewed the patients History and Physical, chart, labs and discussed the procedure including the risks, benefits and alternatives for the proposed anesthesia with the patient or authorized representative who has indicated his/her understanding and acceptance.     Dental advisory given  Plan Discussed with: CRNA and Surgeon  Anesthesia Plan Comments: (Discussed risks of anesthesia with patient, including possibility of difficulty with spontaneous ventilation under anesthesia  necessitating airway intervention, PONV, and rare risks such as cardiac or respiratory or neurological events, and allergic reactions. Discussed the role of CRNA in patient's perioperative care. Patient understands.)       Anesthesia Quick Evaluation

## 2023-06-01 ENCOUNTER — Encounter: Payer: Self-pay | Admitting: Gastroenterology

## 2023-08-13 ENCOUNTER — Emergency Department: Payer: Medicare PPO

## 2023-08-13 ENCOUNTER — Other Ambulatory Visit: Payer: Self-pay

## 2023-08-13 ENCOUNTER — Inpatient Hospital Stay
Admission: EM | Admit: 2023-08-13 | Discharge: 2023-08-18 | DRG: 357 | Disposition: A | Discharge status: Home / Self Care | Payer: Medicare PPO | Attending: Internal Medicine | Admitting: Internal Medicine

## 2023-08-13 ENCOUNTER — Encounter: Payer: Self-pay | Admitting: Emergency Medicine

## 2023-08-13 DIAGNOSIS — Z8249 Family history of ischemic heart disease and other diseases of the circulatory system: Secondary | ICD-10-CM

## 2023-08-13 DIAGNOSIS — Z8616 Personal history of COVID-19: Secondary | ICD-10-CM

## 2023-08-13 DIAGNOSIS — N1831 Chronic kidney disease, stage 3a: Secondary | ICD-10-CM | POA: Diagnosis present

## 2023-08-13 DIAGNOSIS — Z8052 Family history of malignant neoplasm of bladder: Secondary | ICD-10-CM

## 2023-08-13 DIAGNOSIS — Z951 Presence of aortocoronary bypass graft: Secondary | ICD-10-CM

## 2023-08-13 DIAGNOSIS — E1122 Type 2 diabetes mellitus with diabetic chronic kidney disease: Secondary | ICD-10-CM | POA: Diagnosis present

## 2023-08-13 DIAGNOSIS — D62 Acute posthemorrhagic anemia: Secondary | ICD-10-CM | POA: Diagnosis present

## 2023-08-13 DIAGNOSIS — K921 Melena: Principal | ICD-10-CM | POA: Diagnosis present

## 2023-08-13 DIAGNOSIS — I480 Paroxysmal atrial fibrillation: Secondary | ICD-10-CM | POA: Diagnosis present

## 2023-08-13 DIAGNOSIS — K219 Gastro-esophageal reflux disease without esophagitis: Secondary | ICD-10-CM | POA: Diagnosis present

## 2023-08-13 DIAGNOSIS — Z887 Allergy status to serum and vaccine status: Secondary | ICD-10-CM

## 2023-08-13 DIAGNOSIS — E871 Hypo-osmolality and hyponatremia: Secondary | ICD-10-CM | POA: Diagnosis present

## 2023-08-13 DIAGNOSIS — I10 Essential (primary) hypertension: Secondary | ICD-10-CM | POA: Insufficient documentation

## 2023-08-13 DIAGNOSIS — K31811 Angiodysplasia of stomach and duodenum with bleeding: Secondary | ICD-10-CM | POA: Diagnosis not present

## 2023-08-13 DIAGNOSIS — D539 Nutritional anemia, unspecified: Secondary | ICD-10-CM | POA: Diagnosis present

## 2023-08-13 DIAGNOSIS — Z9841 Cataract extraction status, right eye: Secondary | ICD-10-CM

## 2023-08-13 DIAGNOSIS — R079 Chest pain, unspecified: Secondary | ICD-10-CM | POA: Diagnosis present

## 2023-08-13 DIAGNOSIS — Z882 Allergy status to sulfonamides status: Secondary | ICD-10-CM

## 2023-08-13 DIAGNOSIS — Z7982 Long term (current) use of aspirin: Secondary | ICD-10-CM

## 2023-08-13 DIAGNOSIS — N179 Acute kidney failure, unspecified: Secondary | ICD-10-CM | POA: Diagnosis present

## 2023-08-13 DIAGNOSIS — Z841 Family history of disorders of kidney and ureter: Secondary | ICD-10-CM

## 2023-08-13 DIAGNOSIS — Z87891 Personal history of nicotine dependence: Secondary | ICD-10-CM

## 2023-08-13 DIAGNOSIS — Z79899 Other long term (current) drug therapy: Secondary | ICD-10-CM

## 2023-08-13 DIAGNOSIS — I25118 Atherosclerotic heart disease of native coronary artery with other forms of angina pectoris: Secondary | ICD-10-CM | POA: Diagnosis present

## 2023-08-13 DIAGNOSIS — Z808 Family history of malignant neoplasm of other organs or systems: Secondary | ICD-10-CM

## 2023-08-13 DIAGNOSIS — I4891 Unspecified atrial fibrillation: Secondary | ICD-10-CM | POA: Diagnosis present

## 2023-08-13 DIAGNOSIS — E119 Type 2 diabetes mellitus without complications: Secondary | ICD-10-CM

## 2023-08-13 DIAGNOSIS — E785 Hyperlipidemia, unspecified: Secondary | ICD-10-CM | POA: Diagnosis present

## 2023-08-13 DIAGNOSIS — Z8551 Personal history of malignant neoplasm of bladder: Secondary | ICD-10-CM

## 2023-08-13 DIAGNOSIS — Z9049 Acquired absence of other specified parts of digestive tract: Secondary | ICD-10-CM

## 2023-08-13 DIAGNOSIS — Z833 Family history of diabetes mellitus: Secondary | ICD-10-CM

## 2023-08-13 DIAGNOSIS — Z7901 Long term (current) use of anticoagulants: Secondary | ICD-10-CM

## 2023-08-13 DIAGNOSIS — I129 Hypertensive chronic kidney disease with stage 1 through stage 4 chronic kidney disease, or unspecified chronic kidney disease: Secondary | ICD-10-CM | POA: Diagnosis present

## 2023-08-13 DIAGNOSIS — Z961 Presence of intraocular lens: Secondary | ICD-10-CM | POA: Diagnosis present

## 2023-08-13 LAB — ABO/RH: ABO/RH(D): A NEG *Deleted

## 2023-08-13 LAB — CBC WITH DIFFERENTIAL/PLATELET
Abs Immature Granulocytes: 0.05 10*3/uL (ref 0.00–0.07)
Basophils Absolute: 0 10*3/uL (ref 0.0–0.1)
Basophils Relative: 0 %
Eosinophils Absolute: 0 10*3/uL (ref 0.0–0.5)
Eosinophils Relative: 0 %
HCT: 13.2 % — CL (ref 36.0–46.0)
Hemoglobin: 4.1 g/dL — CL (ref 12.0–15.0)
Immature Granulocytes: 0 %
Lymphocytes Relative: 17 %
Lymphs Abs: 1.9 10*3/uL (ref 0.7–4.0)
MCH: 30.6 pg (ref 26.0–34.0)
MCHC: 31.1 g/dL (ref 30.0–36.0)
MCV: 98.5 fL (ref 80.0–100.0)
Monocytes Absolute: 0.5 10*3/uL (ref 0.1–1.0)
Monocytes Relative: 5 %
Neutro Abs: 8.7 10*3/uL — ABNORMAL HIGH (ref 1.7–7.7)
Neutrophils Relative %: 78 %
Platelets: 231 10*3/uL (ref 150–400)
RBC: 1.34 MIL/uL — ABNORMAL LOW (ref 3.87–5.11)
RDW: 15.2 % (ref 11.5–15.5)
WBC: 11.2 10*3/uL — ABNORMAL HIGH (ref 4.0–10.5)
nRBC: 0 % (ref 0.0–0.2)

## 2023-08-13 LAB — TROPONIN I (HIGH SENSITIVITY)
Troponin I (High Sensitivity): 7 ng/L (ref <18)
Troponin I (High Sensitivity): 6 ng/L (ref <18)

## 2023-08-13 LAB — COMPREHENSIVE METABOLIC PANEL
ALT: 10 U/L (ref 0–44)
AST: 15 U/L (ref 15–41)
Albumin: 3 g/dL — ABNORMAL LOW (ref 3.5–5.0)
Alkaline Phosphatase: 30 U/L — ABNORMAL LOW (ref 38–126)
Anion gap: 5 (ref 5–15)
BUN: 28 mg/dL — ABNORMAL HIGH (ref 8–23)
CO2: 22 mmol/L (ref 22–32)
Calcium: 8 mg/dL — ABNORMAL LOW (ref 8.9–10.3)
Chloride: 106 mmol/L (ref 98–111)
Creatinine, Ser: 1.15 mg/dL — ABNORMAL HIGH (ref 0.44–1.00)
GFR, Estimated: 48 mL/min — ABNORMAL LOW (ref >60)
Glucose, Bld: 151 mg/dL — ABNORMAL HIGH (ref 70–99)
Potassium: 4.4 mmol/L (ref 3.5–5.1)
Sodium: 133 mmol/L — ABNORMAL LOW (ref 135–145)
Total Bilirubin: 0.9 mg/dL (ref <1.2)
Total Protein: 5.3 g/dL — ABNORMAL LOW (ref 6.5–8.1)

## 2023-08-13 LAB — PROTIME-INR
INR: 2.3 — ABNORMAL HIGH (ref 0.8–1.2)
Prothrombin Time: 25.5 s — ABNORMAL HIGH (ref 11.4–15.2)

## 2023-08-13 LAB — APTT: aPTT: 35 s (ref 24–36)

## 2023-08-13 LAB — PREPARE RBC (CROSSMATCH)

## 2023-08-13 MED ORDER — PANTOPRAZOLE SODIUM 40 MG IV SOLR
40.0000 mg | Freq: Once | INTRAVENOUS | Status: AC
  Administered 2023-08-13: 40 mg via INTRAVENOUS
  Filled 2023-08-13: qty 10

## 2023-08-13 MED ORDER — IOHEXOL 350 MG/ML SOLN
100.0000 mL | Freq: Once | INTRAVENOUS | Status: AC | PRN
  Administered 2023-08-13: 100 mL via INTRAVENOUS

## 2023-08-13 MED ORDER — SODIUM CHLORIDE 0.9% FLUSH
10.0000 mL | Freq: Two times a day (BID) | INTRAVENOUS | Status: DC
  Administered 2023-08-13 – 2023-08-18 (×10): 10 mL via INTRAVENOUS

## 2023-08-13 MED ORDER — MORPHINE SULFATE (PF) 4 MG/ML IV SOLN
4.0000 mg | Freq: Once | INTRAVENOUS | Status: AC
  Administered 2023-08-13: 4 mg via INTRAVENOUS
  Filled 2023-08-13: qty 1

## 2023-08-13 MED ORDER — PANTOPRAZOLE SODIUM 40 MG IV SOLR
40.0000 mg | Freq: Once | INTRAVENOUS | Status: AC
  Administered 2023-08-14: 40 mg via INTRAVENOUS
  Filled 2023-08-13: qty 10

## 2023-08-13 MED ORDER — ONDANSETRON HCL 4 MG/2ML IJ SOLN
4.0000 mg | Freq: Once | INTRAMUSCULAR | Status: AC
  Administered 2023-08-13: 4 mg via INTRAVENOUS
  Filled 2023-08-13: qty 2

## 2023-08-13 NOTE — ED Provider Notes (Signed)
[facility] Provider Note    Event Date/Time   First MD Initiated Contact with Patient 08/13/23 1933     (approximate)   History   Chest Pain (Pt c/o midsternal CP x 1 week )   HPI Adriana Anderson is a 82 y.o. female with history of HTN, DM2, CKD stage III, HLD, A-fib on Eliquis, CAD s/p CABG x 3 presenting today for chest pain.  Patient states for up to 8 months she has had intermittent exertional chest pain.  Over the past week it is significantly worsened at difficulty with ambulation associated with shortness of breath.  She has also had ongoing melanotic stools which she has had for the past 6 to 8 months as well.  Previously seen by GI.  Had endoscopy.  She also at this time notes abdominal pain diffusely but worse along the right side.  Otherwise denies fever, chills, cough, congestion.  Chart review: Patient was seen in August by GI for weight loss, and melena and had endoscopy and colonoscopy at that time.  This showed gastritis without bleeding.  Also seen by cardiology team on 08/06/2023.  At that time, patient had exertional chest pain and shortness of breath.  She was planned to have an ETT Myoview with her cardiology team.     Physical Exam   Triage Vital Signs: ED Triage Vitals  Encounter Vitals Group     BP      Systolic BP Percentile      Diastolic BP Percentile      Pulse      Resp      Temp      Temp src      SpO2      Weight      Height      Head Circumference      Peak Flow      Pain Score      Pain Loc      Pain Education      Exclude from Growth Chart     Most recent vital signs: Vitals:   08/13/23 2200 08/13/23 2300  BP:  125/61  Pulse: 94 93  Resp: 16 17  Temp:  98.2 F (36.8 C)  SpO2: 100% 96%    Physical Exam: I have reviewed the vital signs and nursing notes. General: Awake, alert, no acute distress.  Pale appearing. Head:  Atraumatic, normocephalic.   ENT:  EOM intact, PERRL. Oral mucosa is pink and moist  with no lesions. Neck: Neck is supple with full range of motion, No meningeal signs. Cardiovascular:  RRR, No murmurs. Peripheral pulses palpable and equal bilaterally. Respiratory:  Symmetrical chest wall expansion.  No rhonchi, rales, or wheezes.  Good air movement throughout.  No use of accessory muscles.   Musculoskeletal:  No cyanosis or edema. Moving extremities with full ROM Abdomen:  Soft, tenderness to palpation diffusely but most prominent in the right lower and right upper quadrant, nondistended. Neuro:  GCS 15, moving all four extremities, interacting appropriately. Speech clear. Psych:  Calm, appropriate.   Skin: Pale appearing.   ED Results / Procedures / Treatments   Labs (all labs ordered are listed, but only abnormal results are displayed) Labs Reviewed  CBC WITH DIFFERENTIAL/PLATELET - Abnormal; Notable for the following components:      Result Value   WBC 11.2 (*)    RBC 1.34 (*)    Hemoglobin 4.1 (*)    HCT 13.2 (*)    Neutro Abs  8.7 (*)    All other components within normal limits  COMPREHENSIVE METABOLIC PANEL - Abnormal; Notable for the following components:   Sodium 133 (*)    Glucose, Bld 151 (*)    BUN 28 (*)    Creatinine, Ser 1.15 (*)    Calcium 8.0 (*)    Total Protein 5.3 (*)    Albumin 3.0 (*)    Alkaline Phosphatase 30 (*)    GFR, Estimated 48 (*)    All other components within normal limits  PROTIME-INR - Abnormal; Notable for the following components:   Prothrombin Time 25.5 (*)    INR 2.3 (*)    All other components within normal limits  APTT  TYPE AND SCREEN  PREPARE RBC (CROSSMATCH)  ABO/RH  TROPONIN I (HIGH SENSITIVITY)  TROPONIN I (HIGH SENSITIVITY)     EKG My EKG interpretation: Rate of 92, normal sinus rhythm, normal axis, normal intervals.  No acute ST elevations or depressions   RADIOLOGY Independent of her chest x-ray with no acute pathology.  CT pending at time of signout   PROCEDURES:  Critical Care performed:  Yes, see critical care procedure note(s)  .Critical Care  Performed by: Janith Lima, MD Authorized by: Janith Lima, MD   Critical care provider statement:    Critical care time (minutes):  30   Critical care was necessary to treat or prevent imminent or life-threatening deterioration of the following conditions:  Circulatory failure (Profound anemia)   Critical care was time spent personally by me on the following activities:  Development of treatment plan with patient or surrogate, discussions with consultants, evaluation of patient's response to treatment, examination of patient, ordering and review of laboratory studies, ordering and review of radiographic studies, ordering and performing treatments and interventions, pulse oximetry, re-evaluation of patient's condition and review of old charts    MEDICATIONS ORDERED IN ED: Medications  sodium chloride flush (NS) 0.9 % injection 10 mL (10 mLs Intravenous Given 08/13/23 2242)  pantoprazole (PROTONIX) injection 40 mg (has no administration in time range)  iohexol (OMNIPAQUE) 350 MG/ML injection 100 mL (100 mLs Intravenous Contrast Given 08/13/23 2123)  morphine (PF) 4 MG/ML injection 4 mg (4 mg Intravenous Given 08/13/23 2246)  ondansetron (ZOFRAN) injection 4 mg (4 mg Intravenous Given 08/13/23 2245)  pantoprazole (PROTONIX) injection 40 mg (40 mg Intravenous Given 08/13/23 2310)     IMPRESSION / MDM / ASSESSMENT AND PLAN / ED COURSE  I reviewed the triage vital signs and the nursing notes.                              Differential diagnosis includes, but is not limited to, GI bleed, acute blood loss anemia, less likely ACS  Patient's presentation is most consistent with acute presentation with potential threat to life or bodily function.  Patient is an 82 year old female presenting today for melanotic stools, shortness of breath, and chest pain.  Very pale on exam.  Vital signs otherwise stable.  Laboratory workup notable  for anemia at 4.1.  2 units PRBC ordered for transfusion.  Troponins negative x 2.  EKG with no ischemia.  CMP otherwise reassuring.  Given her significant abdominal pain, CT abdomen/pelvis was also ordered.  This was pending at time of signout.  Plan for CT results and then admission.  Patient was given 80 mg IV Protonix as well.  The patient is on the cardiac monitor to evaluate for evidence  of arrhythmia and/or significant heart rate changes. Clinical Course as of 08/13/23 2345  Mon Aug 13, 2023  2050 Hemoglobin(!!): 4.1 [DW]  2058 Hemoglobin dropped from 9.1 to 4.1 over the past month [DW]    Clinical Course User Index [DW] Janith Lima, MD     FINAL CLINICAL IMPRESSION(S) / ED DIAGNOSES   Final diagnoses:  Melena  Acute blood loss anemia     Rx / DC Orders   ED Discharge Orders     None        Note:  This document was prepared using Dragon voice recognition software and may include unintentional dictation errors.   Janith Lima, MD 08/13/23 [phone]

## 2023-08-13 NOTE — ED Notes (Signed)
Patient transported to CT 

## 2023-08-13 NOTE — ED Triage Notes (Addendum)
Pt c/o midsternal CP x 1 week that is non-radiating, intermittent, worse with movement and tender to palpation. Also c/o GI bleeding x 6 mos with chronic diffuse abd pn that she has seen GI for. CP resolved at this time.

## 2023-08-13 NOTE — ED Notes (Signed)
Pt resting with eyes closed and respirations regular, even and unlabored. 1st unit of PRBCs will be obtained from blood bank for transfusion.

## 2023-08-13 NOTE — ED Notes (Signed)
Mohammed from lab notified this Clinical research associate of critical Hgb 4.1 and Hct 13.2. Dr Anner Crete notified face to face. Awaiting further orders.

## 2023-08-13 NOTE — ED Notes (Signed)
Dr Anner Crete informed of pt with sharp midsternal CP as she has had all week. Anterior chest wall ttp and reproduces pain.

## 2023-08-13 NOTE — ED Notes (Signed)
Dr Wells at bedside

## 2023-08-14 DIAGNOSIS — N179 Acute kidney failure, unspecified: Secondary | ICD-10-CM | POA: Diagnosis present

## 2023-08-14 DIAGNOSIS — Z951 Presence of aortocoronary bypass graft: Secondary | ICD-10-CM | POA: Diagnosis not present

## 2023-08-14 DIAGNOSIS — N1831 Chronic kidney disease, stage 3a: Secondary | ICD-10-CM | POA: Diagnosis present

## 2023-08-14 DIAGNOSIS — Z882 Allergy status to sulfonamides status: Secondary | ICD-10-CM | POA: Diagnosis not present

## 2023-08-14 DIAGNOSIS — K921 Melena: Principal | ICD-10-CM | POA: Diagnosis present

## 2023-08-14 DIAGNOSIS — R7989 Other specified abnormal findings of blood chemistry: Secondary | ICD-10-CM

## 2023-08-14 DIAGNOSIS — Z8719 Personal history of other diseases of the digestive system: Secondary | ICD-10-CM | POA: Diagnosis not present

## 2023-08-14 DIAGNOSIS — Z7901 Long term (current) use of anticoagulants: Secondary | ICD-10-CM | POA: Diagnosis not present

## 2023-08-14 DIAGNOSIS — E1122 Type 2 diabetes mellitus with diabetic chronic kidney disease: Secondary | ICD-10-CM | POA: Diagnosis present

## 2023-08-14 DIAGNOSIS — Z8249 Family history of ischemic heart disease and other diseases of the circulatory system: Secondary | ICD-10-CM | POA: Diagnosis not present

## 2023-08-14 DIAGNOSIS — I10 Essential (primary) hypertension: Secondary | ICD-10-CM | POA: Insufficient documentation

## 2023-08-14 DIAGNOSIS — D62 Acute posthemorrhagic anemia: Secondary | ICD-10-CM | POA: Diagnosis present

## 2023-08-14 DIAGNOSIS — K219 Gastro-esophageal reflux disease without esophagitis: Secondary | ICD-10-CM | POA: Diagnosis present

## 2023-08-14 DIAGNOSIS — Z887 Allergy status to serum and vaccine status: Secondary | ICD-10-CM | POA: Diagnosis not present

## 2023-08-14 DIAGNOSIS — Z961 Presence of intraocular lens: Secondary | ICD-10-CM | POA: Diagnosis present

## 2023-08-14 DIAGNOSIS — Z9841 Cataract extraction status, right eye: Secondary | ICD-10-CM | POA: Diagnosis not present

## 2023-08-14 DIAGNOSIS — Z8052 Family history of malignant neoplasm of bladder: Secondary | ICD-10-CM | POA: Diagnosis not present

## 2023-08-14 DIAGNOSIS — Z9049 Acquired absence of other specified parts of digestive tract: Secondary | ICD-10-CM | POA: Diagnosis not present

## 2023-08-14 DIAGNOSIS — Z8551 Personal history of malignant neoplasm of bladder: Secondary | ICD-10-CM

## 2023-08-14 DIAGNOSIS — D649 Anemia, unspecified: Secondary | ICD-10-CM

## 2023-08-14 DIAGNOSIS — R079 Chest pain, unspecified: Secondary | ICD-10-CM | POA: Diagnosis not present

## 2023-08-14 DIAGNOSIS — D539 Nutritional anemia, unspecified: Secondary | ICD-10-CM | POA: Diagnosis present

## 2023-08-14 DIAGNOSIS — E785 Hyperlipidemia, unspecified: Secondary | ICD-10-CM | POA: Diagnosis present

## 2023-08-14 DIAGNOSIS — E871 Hypo-osmolality and hyponatremia: Secondary | ICD-10-CM | POA: Diagnosis present

## 2023-08-14 DIAGNOSIS — I480 Paroxysmal atrial fibrillation: Secondary | ICD-10-CM | POA: Diagnosis present

## 2023-08-14 DIAGNOSIS — Z8616 Personal history of COVID-19: Secondary | ICD-10-CM | POA: Diagnosis not present

## 2023-08-14 DIAGNOSIS — Z87891 Personal history of nicotine dependence: Secondary | ICD-10-CM | POA: Diagnosis not present

## 2023-08-14 DIAGNOSIS — I129 Hypertensive chronic kidney disease with stage 1 through stage 4 chronic kidney disease, or unspecified chronic kidney disease: Secondary | ICD-10-CM | POA: Diagnosis present

## 2023-08-14 DIAGNOSIS — I25118 Atherosclerotic heart disease of native coronary artery with other forms of angina pectoris: Secondary | ICD-10-CM | POA: Diagnosis present

## 2023-08-14 DIAGNOSIS — K31811 Angiodysplasia of stomach and duodenum with bleeding: Secondary | ICD-10-CM | POA: Diagnosis present

## 2023-08-14 LAB — CBG MONITORING, ED
Glucose-Capillary: 120 mg/dL — ABNORMAL HIGH (ref 70–99)
Glucose-Capillary: 114 mg/dL — ABNORMAL HIGH (ref 70–99)
Glucose-Capillary: 122 mg/dL — ABNORMAL HIGH (ref 70–99)
Glucose-Capillary: 98 mg/dL (ref 70–99)
Glucose-Capillary: 118 mg/dL — ABNORMAL HIGH (ref 70–99)

## 2023-08-14 LAB — HEMOGLOBIN AND HEMATOCRIT, BLOOD
HCT: 23.3 % — ABNORMAL LOW (ref 36.0–46.0)
HCT: 23.3 % — ABNORMAL LOW (ref 36.0–46.0)
Hemoglobin: 7.7 g/dL — ABNORMAL LOW (ref 12.0–15.0)
Hemoglobin: 7.7 g/dL — ABNORMAL LOW (ref 12.0–15.0)

## 2023-08-14 LAB — CBC
HCT: 20.4 % — ABNORMAL LOW (ref 36.0–46.0)
Hemoglobin: 6.6 g/dL — ABNORMAL LOW (ref 12.0–15.0)
MCH: 30.6 pg (ref 26.0–34.0)
MCHC: 32.4 g/dL (ref 30.0–36.0)
MCV: 94.4 fL (ref 80.0–100.0)
Platelets: 221 10*3/uL (ref 150–400)
RBC: 2.16 MIL/uL — ABNORMAL LOW (ref 3.87–5.11)
RDW: 14.5 % (ref 11.5–15.5)
WBC: 8.5 10*3/uL (ref 4.0–10.5)
nRBC: 0 % (ref 0.0–0.2)

## 2023-08-14 LAB — GLUCOSE, CAPILLARY
Glucose-Capillary: 102 mg/dL — ABNORMAL HIGH (ref 70–99)
Glucose-Capillary: 109 mg/dL — ABNORMAL HIGH (ref 70–99)
Glucose-Capillary: 109 mg/dL — ABNORMAL HIGH (ref 70–99)

## 2023-08-14 LAB — IRON AND TIBC
Iron: 188 ug/dL — ABNORMAL HIGH (ref 28–170)
Saturation Ratios: 70 % — ABNORMAL HIGH (ref 10.4–31.8)
TIBC: 267 ug/dL (ref 250–450)
UIBC: 79 ug/dL

## 2023-08-14 LAB — HEMOGLOBIN A1C
Hgb A1c MFr Bld: 5.2 % (ref 4.8–5.6)
Mean Plasma Glucose: 102.54 mg/dL

## 2023-08-14 LAB — VITAMIN B12: Vitamin B-12: 514 pg/mL (ref 180–914)

## 2023-08-14 LAB — VITAMIN D 25 HYDROXY (VIT D DEFICIENCY, FRACTURES): Vit D, 25-Hydroxy: 72.62 ng/mL (ref 30–100)

## 2023-08-14 LAB — FOLATE: Folate: 7 ng/mL (ref >5.9)

## 2023-08-14 MED ORDER — INSULIN ASPART 100 UNIT/ML IJ SOLN
0.0000 [IU] | INTRAMUSCULAR | Status: DC
  Administered 2023-08-16 – 2023-08-17 (×2): 2 [IU] via SUBCUTANEOUS
  Filled 2023-08-14 (×2): qty 1

## 2023-08-14 MED ORDER — ATORVASTATIN CALCIUM 20 MG PO TABS
40.0000 mg | ORAL_TABLET | Freq: Every day | ORAL | Status: DC
  Administered 2023-08-14 – 2023-08-18 (×5): 40 mg via ORAL
  Filled 2023-08-14 (×5): qty 2

## 2023-08-14 MED ORDER — MORPHINE SULFATE (PF) 2 MG/ML IV SOLN: 2.0000 mg | INTRAVENOUS | Status: DC | PRN

## 2023-08-14 MED ORDER — ACETAMINOPHEN 325 MG PO TABS: 650.0000 mg | ORAL_TABLET | Freq: Four times a day (QID) | ORAL | Status: DC | PRN

## 2023-08-14 MED ORDER — ONDANSETRON HCL 4 MG/2ML IJ SOLN: 4.0000 mg | Freq: Four times a day (QID) | INTRAMUSCULAR | Status: DC | PRN

## 2023-08-14 MED ORDER — ACETAMINOPHEN 650 MG RE SUPP
650.0000 mg | Freq: Four times a day (QID) | RECTAL | Status: DC | PRN
Start: 2023-08-14 — End: 2023-08-18

## 2023-08-14 MED ORDER — ONDANSETRON HCL 4 MG PO TABS: 4.0000 mg | ORAL_TABLET | Freq: Four times a day (QID) | ORAL | Status: DC | PRN

## 2023-08-14 MED ORDER — PANTOPRAZOLE SODIUM 40 MG IV SOLR
40.0000 mg | Freq: Two times a day (BID) | INTRAVENOUS | Status: DC
  Administered 2023-08-14 – 2023-08-18 (×9): 40 mg via INTRAVENOUS
  Filled 2023-08-14 (×9): qty 10

## 2023-08-14 NOTE — Consult Note (Signed)
Wyline Mood , MD [address], [address], [place], [place], [id number] [address], [address], [place], [place], [address] Phone: [phone]  Fax: [phone]  Consultation  Referring Provider:     Dr Lucianne Muss  Primary Care Physician:  Gracelyn Nurse, MD Primary Gastroenterologist:  Dr. Norma Fredrickson          Reason for Consultation:     GI bleed  Date of Admission:  08/13/2023 Date of Consultation:  08/14/2023         HPI:   Adriana Anderson is a 82 y.o. female is a patient of Dr. Norma Fredrickson seen previously at their office in July 2024 for GERD.  Prior history of cholecystectomy history of constipation.  Barium swallow in February 2024 showed mild narrowing of the distal esophagus just proximal to the GE junction probably a stricture, small hiatal hernia, GERD, moderate to severe esophageal dysmotility on Eliquis.  She is history of CABG, CAD  She was noted to have mild microcytic anemia by Dr. Timothy Lasso on 05/31/2023 with hemoglobin down from 11 to 9.6 g.  Normal iron studies.  She underwent an upper endoscopy small hiatal hernia was noted inflammation seen in the gastric antrum biopsies taken abnormal motility in the esophagus empiric dilation of the esophagus to 48 [place].  Biopsies taken during the procedure demonstrated parietal cell hyperplasia has been seen in hyperglycemic state such as in PPI no H. pylori seen reactive gastropathy noted biopsies of the duodenum were also normal.  She presented to the emergency room with melena and a hemoglobin of 4 g underwent a CT angiogram in the emergency room that showed no evidence of active bleeding hemoglobin for years back was 12.9 g this morning 6.6 g MCV of 94 INR of 2.3 BUN of 28 creatinine 1.15 baseline creatinine 0.88 no recent colonoscopy on chart.  Eliquis has been held She states that she has been having black stools going on for a few months on a daily basis.  Denies any use of NSAIDs.  Denies any abdominal pain.  Denies any hematemesis.  Last  colonoscopy was some years back.  Last dose of Eliquis was last night. Past Medical History:  Diagnosis Date   A-fib (HCC)    Acid reflux    Anginal pain (HCC)    Bladder cancer (HCC)    Bladder mass    Coronary artery disease    COVID-19 09/11/2019   test done at [facility]   Dyspnea    GERD (gastroesophageal reflux disease)    History of biopsy of bladder    History of cystoscopy    HLD (hyperlipidemia)    Hypertension    Liver disease    Motion sickness    boats   PONV (postoperative nausea and vomiting)    Pre-diabetes     Past Surgical History:  Procedure Laterality Date   APPENDECTOMY     BIOPSY  05/31/2023   Procedure: BIOPSY;  Surgeon: Jaynie Collins, DO;  Location: [facility] ENDOSCOPY;  Service: Gastroenterology;;   CATARACT EXTRACTION W/PHACO Right 12/10/2019   Procedure: CATARACT EXTRACTION PHACO AND INTRAOCULAR LENS PLACEMENT (IOC) RIGHT 5.43 00:53.3 10.2%;  Surgeon: Lockie Mola, MD;  Location: [facility];  Service: Ophthalmology;  Laterality: Right;  has to go day before surgery for covid testing per PAT testing   CHOLECYSTECTOMY     CORONARY ARTERY BYPASS GRAFT  03/07/2017   3 vessel - [facility]   CYSTOSCOPY WITH BIOPSY N/A 11/15/2015   Procedure: CYSTOSCOPY WITH  BIOPSY;  Surgeon: Vanna Scotland, MD;  Location: [facility] ORS;  Service: Urology;  Laterality: N/A;   ESOPHAGOGASTRODUODENOSCOPY N/A 05/31/2023   Procedure: ESOPHAGOGASTRODUODENOSCOPY (EGD);  Surgeon: Jaynie Collins, DO;  Location: [facility] ENDOSCOPY;  Service: Gastroenterology;  Laterality: N/A;   INTRAOPERATIVE CHOLANGIOGRAM  08/12/2019   Procedure: INTRAOPERATIVE CHOLANGIOGRAM;  Surgeon: Leafy Ro, MD;  Location: [facility] ORS;  Service: General;;   IR PERC CHOLECYSTOSTOMY  05/23/2019   LEFT HEART CATH AND CORONARY ANGIOGRAPHY Left 02/23/2017   Procedure: Left Heart Cath and Coronary Angiography;  Surgeon: Marcina Millard, MD;  Location: [facility] INVASIVE CV LAB;  Service:  Cardiovascular;  Laterality: Left;   MALONEY DILATION  05/31/2023   Procedure: Elease Hashimoto DILATION;  Surgeon: Jaynie Collins, DO;  Location: [facility] ENDOSCOPY;  Service: Gastroenterology;;   TOE SURGERY      Prior to Admission medications   Medication Sig Start Date End Date Taking? Authorizing Provider  apixaban (ELIQUIS) 5 MG TABS tablet Take 5 mg by mouth 2 (two) times daily.  03/18/17   [provider]  aspirin EC 81 MG tablet Take 81 mg by mouth at bedtime.    [provider]  atorvastatin (LIPITOR) 40 MG tablet Take 1 tablet by mouth daily. 03/30/21   [provider]  Calcium Carbonate-Vitamin D (CALTRATE 600+D PO) Take 1 tablet by mouth daily.     [provider]  estradiol (ESTRACE) 0.1 MG/GM vaginal cream APPLY A PEA SIZED AMOUNT TO TIP OF FINGER TO URETHRA BEFORE BED. [facility] HANDS WELL AFTER APPLICATION 10/03/22   Michiel Cowboy A, PA-C  isosorbide mononitrate (IMDUR) 30 MG 24 hr tablet Take 30 mg by mouth daily.  06/03/19   [provider]  loratadine (CLARITIN) 10 MG tablet Take 10 mg by mouth daily as needed for allergies.    [provider]  metoprolol tartrate (LOPRESSOR) 25 MG tablet Take 25 mg by mouth 2 (two) times daily. 07/25/22   [provider]  Multiple Vitamins-Minerals (OCUVITE PRESERVISION PO) Take 1 tablet by mouth daily.    [provider]  nitrofurantoin, macrocrystal-monohydrate, (MACROBID) 100 MG capsule TAKE 1 CAPSULE BY MOUTH EVERY DAY 09/26/22   Sondra Come, MD  nitroGLYCERIN 2.5 MG CR capsule Take 2.5 mg by mouth daily.     [provider]  pantoprazole (PROTONIX) 40 MG tablet Take 40 mg by mouth daily. 04/28/18   [provider]  solifenacin (VESICARE) 10 MG tablet TAKE 1 TABLET BY MOUTH EVERY DAY 04/02/23   Sondra Come, MD    Family History  Problem Relation Age of Onset   Bladder Cancer Brother        x 3    Brain cancer Father    Kidney disease Father     Diabetes Other    Heart attack Mother    Heart failure Sister    Bladder Cancer Brother    Prostate cancer Neg Hx    Kidney cancer Neg Hx      Social History   Tobacco Use   Smoking status: Former    Current packs/day: 0.00    Types: Cigarettes    Quit date: 04/20/1982    Years since quitting: 41.3    Passive exposure: Past   Smokeless tobacco: Never  Vaping Use   Vaping status: Never Used  Substance Use Topics   Alcohol use: Yes    Alcohol/week: 0.0 standard drinks of alcohol   Drug use: No    Allergies as of 08/13/2023 - Review Complete  08/13/2023  Allergen Reaction Noted   Tetanus-diphth-acell pertussis Swelling 04/21/2015   Sulfa antibiotics Rash 04/21/2015    Review of Systems:    All systems reviewed and negative except where noted in HPI.   Physical Exam:  Vital signs in last 24 hours: Temp:  [97.4 F (36.3 C)-98.4 F (36.9 C)] 97.4 F (36.3 C) (11/12 0705) Pulse Rate:  [92-112] 112 (11/12 0705) Resp:  [13-21] 15 (11/12 0600) BP: (114-153)/(45-117) 153/61 (11/12 0705) SpO2:  [94 %-100 %] 94 % (11/12 0705) Weight:  [61.2 kg] 61.2 kg (11/11 1956)   General:   Pleasant, cooperative in NAD Head:  Normocephalic and atraumatic. Eyes:   No icterus.   Conjunctiva pink. PERRLA. Ears:  Normal auditory acuity. Neck:  Supple; no masses or thyroidomegaly Lungs: Respirations even and unlabored. Lungs clear to auscultation bilaterally.   No wheezes, crackles, or rhonchi.  Heart:  Regular rate and rhythm;  Without murmur, clicks, rubs or gallops Abdomen:  Soft, nondistended, nontender. Normal bowel sounds. No appreciable masses or hepatomegaly.  No rebound or guarding.  Neurologic:  Alert and oriented x3;  grossly normal neurologically. Skin:  Intact without significant lesions or rashes. Cervical Nodes:  No significant cervical adenopathy. Psych:  Alert and cooperative. Normal affect.  LAB RESULTS: Recent Labs    08/13/23 2017 08/14/23 0556  WBC 11.2* 8.5  HGB  4.1* 6.6*  HCT 13.2* 20.4*  PLT 231 221   BMET Recent Labs    08/13/23 2017  NA 133*  K 4.4  CL 106  CO2 22  GLUCOSE 151*  BUN 28*  CREATININE 1.15*  CALCIUM 8.0*   LFT Recent Labs    08/13/23 2017  PROT 5.3*  ALBUMIN 3.0*  AST 15  ALT 10  ALKPHOS 30*  BILITOT 0.9   PT/INR Recent Labs    08/13/23 2017  LABPROT 25.5*  INR 2.3*    STUDIES: CT ANGIO GI BLEED  Result Date: 08/14/2023 CLINICAL DATA:  Melanotic stools, anemic, noticeable tenderness to palpation in the right lower quadrant and right upper quadrant EXAM: CTA ABDOMEN AND PELVIS WITHOUT AND WITH CONTRAST TECHNIQUE: Multidetector CT imaging of the abdomen and pelvis was performed using the standard protocol during bolus administration of intravenous contrast. Multiplanar reconstructed images and MIPs were obtained and reviewed to evaluate the vascular anatomy. RADIATION DOSE REDUCTION: This exam was performed according to the departmental dose-optimization program which includes automated exposure control, adjustment of the mA and/or kV according to patient size and/or use of iterative reconstruction technique. CONTRAST:  OMNIPAQUE IOHEXOL 350 MG/ML SOLN COMPARISON:  CT abdomen and pelvis 08/02/2022 FINDINGS: VASCULAR Scattered calcified atherosclerotic plaque without hemodynamically significant stenosis. No aortic aneurysm or dissection. The mesenteric and renal arteries are patent. Patent inflow and outflow arteries bilaterally. Review of the MIP images confirms the above findings. NON-VASCULAR Lower chest: No acute abnormality. Hepatobiliary: Cholecystectomy. No biliary dilation. Unremarkable liver. Pancreas: Unremarkable. Spleen: Unremarkable. Adrenals/Urinary Tract: Unremarkable adrenal glands. Bilateral cortical renal scarring. No urinary calculi or hydronephrosis. Unremarkable bladder. Stomach/Bowel: Normal caliber large and small bowel. The appendix is not visualized. Stomach is within normal limits. No  evidence of active GI bleeding. Lymphatic: No lymphadenopathy. Reproductive: Unremarkable. Other: No free intraperitoneal fluid or air. Musculoskeletal: No acute fracture. IMPRESSION: 1. No evidence of active GI bleeding. 2. No acute abnormality in the abdomen or pelvis. Aortic Atherosclerosis (ICD10-I70.0). Electronically Signed   By: Minerva Fester M.D.   On: 08/14/2023 00:18   DG Chest Portable 1 View  Result Date:  08/13/2023 CLINICAL DATA:  Chest pain EXAM: PORTABLE CHEST 1 VIEW COMPARISON:  Chest x-ray 08/23/2019 FINDINGS: Sternotomy wires are again seen. There is linear scarring or atelectasis in the left lung base. The lungs are otherwise clear. The heart size and mediastinal contours are within normal limits. No pleural effusion or pneumothorax. The visualized skeletal structures are unremarkable. IMPRESSION: Linear scarring or atelectasis in the left lung base. Electronically Signed   By: Darliss Cheney M.D.   On: 08/13/2023 23:25      Impression / Plan:   Adriana Anderson is a 82 y.o. y/o female with history of CABG, CAD, atrial fibrillation on Eliquis, GERD presented to the hospital with melena and a hemoglobin of 4 g.  In 05/22/2023 evaluated by Dr. Timothy Lasso for dysphagia and had her esophagus dilated to 3 [place] and evidence of gastritis was noted with no H. pylori.  At that point of time the patient had macrocytic anemia.  Presently has normocytic anemia, AKI.  Plan 1.  Monitor CBC transfuse as needed 2.  IV PPI 3.  Once Eliquis has been held for 3 doses we will plan for a push endoscopy due to history of melena and if negative may require a colonoscopy, may have small bowel AVMs.  Creatinine elevated so will require 3 days of Eliquis to ensure it has washed out from the system.   will be planned for Friday.  Last dose of Eliquis was last night  I have discussed alternative options, risks & benefits,  which include, but are not limited to, bleeding, infection, perforation,respiratory  complication & drug reaction.  The patient agrees with this plan & written consent will be obtained.     Thank you for involving me in the care of this patient.      LOS: 0 days   Wyline Mood, MD  08/14/2023, 8:09 AM

## 2023-08-14 NOTE — Assessment & Plan Note (Signed)
Will hold antihypertensives in the setting of GI bleed

## 2023-08-14 NOTE — H&P (Addendum)
History and Physical    Patient: Adriana Anderson HQI:[id number] DOB: April 01, 1941 DOA: 08/13/2023 DOS: the patient was seen and examined on 08/14/2023 PCP: Gracelyn Nurse, MD  Patient coming from: Home  Chief Complaint:  Chief Complaint  Patient presents with   Chest Pain    Pt c/o midsternal CP x 1 week     HPI: Adriana Anderson is a 82 y.o. female with medical history significant for HTN, DM2, CKD stage III,  A-fib on Eliquis,NSVT,  CAD s/p CABG x 3, bladder cancer in remission, history of melena s/p EGD 05/2023 that showed gastritis but no active bleeding, who presents to the ED with a complaint of exertional chest pain and shortness of breath as well as intermittent melanotic stools for the past few months.  She points to the lower retrosternal area as the site of most discomfort.  States the pain is severe.  She was evaluated by cardiology on 11/4 with plans to do a nuclear stress test.  Patient denies cough, fever or chills.  Denies abdominal pain.  Denies vomiting. ED course and data review: BP 124/45 with heart rate 92-101 with otherwise normal vitals. Labs notable for hemoglobin 4.1, down from 9.1 a month ago. Troponin 6 Other notable labs include creatinine of 1.15 which appears to be her baseline EKG, personally viewed and interpreted showing sinus at 92 with no acute ST-T wave changes CTA abdomen and pelvis with no evidence of active GI bleeding Chest x-ray nonacute shows linear scarring or atelectasis at left lung base  Patient started on Protonix bolus and infusion and started on transfusion of PRBCs Patient did receive morphine for chest pain as well as Zofran for nausea Hospitalist consulted for admission.   Review of Systems: As mentioned in the history of present illness. All other systems reviewed and are negative.  Past Medical History:  Diagnosis Date   A-fib (HCC)    Acid reflux    Anginal pain (HCC)    Bladder cancer (HCC)    Bladder mass    Coronary artery  disease    COVID-19 09/11/2019   test done at [facility]   Dyspnea    GERD (gastroesophageal reflux disease)    History of biopsy of bladder    History of cystoscopy    HLD (hyperlipidemia)    Hypertension    Liver disease    Motion sickness    boats   PONV (postoperative nausea and vomiting)    Pre-diabetes    Past Surgical History:  Procedure Laterality Date   APPENDECTOMY     BIOPSY  05/31/2023   Procedure: BIOPSY;  Surgeon: Jaynie Collins, DO;  Location: [facility] ENDOSCOPY;  Service: Gastroenterology;;   CATARACT EXTRACTION W/PHACO Right 12/10/2019   Procedure: CATARACT EXTRACTION PHACO AND INTRAOCULAR LENS PLACEMENT (IOC) RIGHT 5.43 00:53.3 10.2%;  Surgeon: Lockie Mola, MD;  Location: [facility] SURGERY CNTR;  Service: Ophthalmology;  Laterality: Right;  has to go day before surgery for covid testing per PAT testing   CHOLECYSTECTOMY     CORONARY ARTERY BYPASS GRAFT  03/07/2017   3 vessel - [facility]   CYSTOSCOPY WITH BIOPSY N/A 11/15/2015   Procedure: CYSTOSCOPY WITH BIOPSY;  Surgeon: Vanna Scotland, MD;  Location: [facility] ORS;  Service: Urology;  Laterality: N/A;   ESOPHAGOGASTRODUODENOSCOPY N/A 05/31/2023   Procedure: ESOPHAGOGASTRODUODENOSCOPY (EGD);  Surgeon: Jaynie Collins, DO;  Location: [facility] ENDOSCOPY;  Service: Gastroenterology;  Laterality: N/A;   INTRAOPERATIVE CHOLANGIOGRAM  08/12/2019   Procedure: INTRAOPERATIVE CHOLANGIOGRAM;  Surgeon: Everlene Farrier,  Merri Ray, MD;  Location: [facility] ORS;  Service: General;;   IR PERC CHOLECYSTOSTOMY  05/23/2019   LEFT HEART CATH AND CORONARY ANGIOGRAPHY Left 02/23/2017   Procedure: Left Heart Cath and Coronary Angiography;  Surgeon: Marcina Millard, MD;  Location: [facility] INVASIVE CV LAB;  Service: Cardiovascular;  Laterality: Left;   MALONEY DILATION  05/31/2023   Procedure: Elease Hashimoto DILATION;  Surgeon: Jaynie Collins, DO;  Location: [facility] ENDOSCOPY;  Service: Gastroenterology;;   TOE SURGERY     Social History:  reports  that she quit smoking about 41 years ago. Her smoking use included cigarettes. She has been exposed to tobacco smoke. She has never used smokeless tobacco. She reports current alcohol use. She reports that she does not use drugs.  Allergies  Allergen Reactions   Tetanus-Diphth-Acell Pertussis Swelling   Sulfa Antibiotics Rash    Family History  Problem Relation Age of Onset   Bladder Cancer Brother        x 3    Brain cancer Father    Kidney disease Father    Diabetes Other    Heart attack Mother    Heart failure Sister    Bladder Cancer Brother    Prostate cancer Neg Hx    Kidney cancer Neg Hx     Prior to Admission medications   Medication Sig Start Date End Date Taking? Authorizing Provider  apixaban (ELIQUIS) 5 MG TABS tablet Take 5 mg by mouth 2 (two) times daily.  03/18/17   [provider]  aspirin EC 81 MG tablet Take 81 mg by mouth at bedtime.    [provider]  atorvastatin (LIPITOR) 40 MG tablet Take 1 tablet by mouth daily. 03/30/21   [provider]  Calcium Carbonate-Vitamin D (CALTRATE 600+D PO) Take 1 tablet by mouth daily.     [provider]  estradiol (ESTRACE) 0.1 MG/GM vaginal cream APPLY A PEA SIZED AMOUNT TO TIP OF FINGER TO URETHRA BEFORE BED. [facility] HANDS WELL AFTER APPLICATION 10/03/22   Michiel Cowboy A, PA-C  isosorbide mononitrate (IMDUR) 30 MG 24 hr tablet Take 30 mg by mouth daily.  06/03/19   [provider]  loratadine (CLARITIN) 10 MG tablet Take 10 mg by mouth daily as needed for allergies.    [provider]  metoprolol tartrate (LOPRESSOR) 25 MG tablet Take 25 mg by mouth 2 (two) times daily. 07/25/22   [provider]  Multiple Vitamins-Minerals (OCUVITE PRESERVISION PO) Take 1 tablet by mouth daily.    [provider]  nitrofurantoin, macrocrystal-monohydrate, (MACROBID) 100 MG capsule TAKE 1 CAPSULE BY MOUTH EVERY DAY 09/26/22   Sondra Come, MD  nitroGLYCERIN 2.5 MG CR  capsule Take 2.5 mg by mouth daily.     [provider]  pantoprazole (PROTONIX) 40 MG tablet Take 40 mg by mouth daily. 04/28/18   [provider]  solifenacin (VESICARE) 10 MG tablet TAKE 1 TABLET BY MOUTH EVERY DAY 04/02/23   Sondra Come, MD    Physical Exam: Vitals:   08/13/23 2200 08/13/23 2300 08/14/23 0016 08/14/23 0034  BP:  125/61 (!) 135/48 (!) 134/49  Pulse: 94 93 (!) 101 97  Resp: 16 17 18 16   Temp:  98.2 F (36.8 C) 98.4 F (36.9 C) 98.2 F (36.8 C)  TempSrc:  Oral Oral Oral  SpO2: 100% 96% 100% 98%  Weight:      Height:       Physical Exam Vitals and nursing note reviewed.  Constitutional:  General: She is not in acute distress. HENT:     Head: Normocephalic and atraumatic.  Cardiovascular:     Rate and Rhythm: Regular rhythm. Tachycardia present.     Heart sounds: Normal heart sounds.  Pulmonary:     Effort: Pulmonary effort is normal.     Breath sounds: Normal breath sounds.  Abdominal:     Palpations: Abdomen is soft.     Tenderness: There is no abdominal tenderness.  Neurological:     Mental Status: Mental status is at baseline.     Labs on Admission: I have personally reviewed following labs and imaging studies  CBC: Recent Labs  Lab 08/13/23 2017  WBC 11.2*  NEUTROABS 8.7*  HGB 4.1*  HCT 13.2*  MCV 98.5  PLT 231   Basic Metabolic Panel: Recent Labs  Lab 08/13/23 2017  NA 133*  K 4.4  CL 106  CO2 22  GLUCOSE 151*  BUN 28*  CREATININE 1.15*  CALCIUM 8.0*   GFR: Estimated Creatinine Clearance: 34.5 mL/min (A) (by C-G formula based on SCr of 1.15 mg/dL (H)). Liver Function Tests: Recent Labs  Lab 08/13/23 2017  AST 15  ALT 10  ALKPHOS 30*  BILITOT 0.9  PROT 5.3*  ALBUMIN 3.0*   No results for input(s): "LIPASE", "AMYLASE" in the last 168 hours. No results for input(s): "AMMONIA" in the last 168 hours. Coagulation Profile: Recent Labs  Lab 08/13/23 2017  INR 2.3*   Cardiac Enzymes: No  results for input(s): "CKTOTAL", "CKMB", "CKMBINDEX", "TROPONINI" in the last 168 hours. BNP (last 3 results) No results for input(s): "PROBNP" in the last 8760 hours. HbA1C: No results for input(s): "HGBA1C" in the last 72 hours. CBG: No results for input(s): "GLUCAP" in the last 168 hours. Lipid Profile: No results for input(s): "CHOL", "HDL", "LDLCALC", "TRIG", "CHOLHDL", "LDLDIRECT" in the last 72 hours. Thyroid Function Tests: No results for input(s): "TSH", "T4TOTAL", "FREET4", "T3FREE", "THYROIDAB" in the last 72 hours. Anemia Panel: No results for input(s): "VITAMINB12", "FOLATE", "FERRITIN", "TIBC", "IRON", "RETICCTPCT" in the last 72 hours. Urine analysis:    Component Value Date/Time   COLORURINE AMBER (A) 05/21/2019 2139   APPEARANCEUR Hazy (A) 03/05/2023 1537   LABSPEC 1.014 05/21/2019 2139   LABSPEC 1.014 06/06/2012 1054   PHURINE 6.0 05/21/2019 2139   GLUCOSEU Negative 03/05/2023 1537   GLUCOSEU Negative 06/06/2012 1054   HGBUR NEGATIVE 05/21/2019 2139   BILIRUBINUR Negative 03/05/2023 1537   BILIRUBINUR Negative 06/06/2012 1054   KETONESUR NEGATIVE 05/21/2019 2139   PROTEINUR Negative 03/05/2023 1537   PROTEINUR 30 (A) 05/21/2019 2139   NITRITE Negative 03/05/2023 1537   NITRITE NEGATIVE 05/21/2019 2139   LEUKOCYTESUR 3+ (A) 03/05/2023 1537   LEUKOCYTESUR NEGATIVE 05/21/2019 2139   LEUKOCYTESUR 3+ 06/06/2012 1054    Radiological Exams on Admission: CT ANGIO GI BLEED  Result Date: 08/14/2023 CLINICAL DATA:  Melanotic stools, anemic, noticeable tenderness to palpation in the right lower quadrant and right upper quadrant EXAM: CTA ABDOMEN AND PELVIS WITHOUT AND WITH CONTRAST TECHNIQUE: Multidetector CT imaging of the abdomen and pelvis was performed using the standard protocol during bolus administration of intravenous contrast. Multiplanar reconstructed images and MIPs were obtained and reviewed to evaluate the vascular anatomy. RADIATION DOSE REDUCTION: This  exam was performed according to the departmental dose-optimization program which includes automated exposure control, adjustment of the mA and/or kV according to patient size and/or use of iterative reconstruction technique. CONTRAST:  OMNIPAQUE IOHEXOL 350 MG/ML SOLN COMPARISON:  CT abdomen and pelvis  08/02/2022 FINDINGS: VASCULAR Scattered calcified atherosclerotic plaque without hemodynamically significant stenosis. No aortic aneurysm or dissection. The mesenteric and renal arteries are patent. Patent inflow and outflow arteries bilaterally. Review of the MIP images confirms the above findings. NON-VASCULAR Lower chest: No acute abnormality. Hepatobiliary: Cholecystectomy. No biliary dilation. Unremarkable liver. Pancreas: Unremarkable. Spleen: Unremarkable. Adrenals/Urinary Tract: Unremarkable adrenal glands. Bilateral cortical renal scarring. No urinary calculi or hydronephrosis. Unremarkable bladder. Stomach/Bowel: Normal caliber large and small bowel. The appendix is not visualized. Stomach is within normal limits. No evidence of active GI bleeding. Lymphatic: No lymphadenopathy. Reproductive: Unremarkable. Other: No free intraperitoneal fluid or air. Musculoskeletal: No acute fracture. IMPRESSION: 1. No evidence of active GI bleeding. 2. No acute abnormality in the abdomen or pelvis. Aortic Atherosclerosis (ICD10-I70.0). Electronically Signed   By: Minerva Fester M.D.   On: 08/14/2023 00:18   DG Chest Portable 1 View  Result Date: 08/13/2023 CLINICAL DATA:  Chest pain EXAM: PORTABLE CHEST 1 VIEW COMPARISON:  Chest x-ray 08/23/2019 FINDINGS: Sternotomy wires are again seen. There is linear scarring or atelectasis in the left lung base. The lungs are otherwise clear. The heart size and mediastinal contours are within normal limits. No pleural effusion or pneumothorax. The visualized skeletal structures are unremarkable. IMPRESSION: Linear scarring or atelectasis in the left lung base.  Electronically Signed   By: Darliss Cheney M.D.   On: 08/13/2023 23:25     Data Reviewed: Relevant notes from primary care and specialist visits, past discharge summaries as available in EHR, including Care Everywhere. Prior diagnostic testing as pertinent to current admission diagnoses Updated medications and problem lists for reconciliation ED course, including vitals, labs, imaging, treatment and response to treatment Triage notes, nursing and pharmacy notes and ED provider's notes Notable results as noted in HPI   Assessment and Plan: * Melena Acute blood loss anemia, Chronic anticoagulation History of EGD 05/2023 showing gastritis Patient with exertional chest pain, shortness of breath and melanotic stool x months Hemoglobin 4.1, down from 9.1 a month ago Hold Eliquis, hold antiplatelets Continue Protonix infusion Continue transfusion of PRBCs to hemoglobin over 8 GI consulted  Chest pain with high risk for cardiac etiology CAD s/p CABG x 3 Suspect chest pain is related to symptomatic anemia EKG nonacute, troponin 6 Patient was evaluated by cardiology on 11/4 with plans for nuclear stress test Hold antiplatelets and BP lowering cardiac meds in the setting of acute GI bleed Treat anemia as outlined above which might provide symptomatic relief Morphine as needed Will consult cardiology, though low suspicion for ACS  Atrial fibrillation (HCC) Chronic anticoagulation Holding Eliquis due to GI bleed  Stage 3a chronic kidney disease (HCC) Renal function at baseline  HTN (hypertension) Will hold antihypertensives in the setting of GI bleed  History of bladder cancer No acute issues suspected  Type 2 diabetes mellitus (HCC) Sliding scale insulin coverage        DVT prophylaxis: SCD  Consults: GI and cardiology, [facility]  Advance Care Planning:   Code Status: Prior   Family Communication: none  Disposition Plan: Back to previous home environment  Severity of  Illness: The appropriate patient status for this patient is INPATIENT. Inpatient status is judged to be reasonable and necessary in order to provide the required intensity of service to ensure the patient's safety. The patient's presenting symptoms, physical exam findings, and initial radiographic and laboratory data in the context of their chronic comorbidities is felt to place them at high risk for further clinical deterioration. Furthermore, it is  not anticipated that the patient will be medically stable for discharge from the hospital within 2 midnights of admission.   * I certify that at the point of admission it is my clinical judgment that the patient will require inpatient hospital care spanning beyond 2 midnights from the point of admission due to high intensity of service, high risk for further deterioration and high frequency of surveillance required.*  Author: Andris Baumann, MD 08/14/2023 1:07 AM  For on call review [email or web address].

## 2023-08-14 NOTE — Plan of Care (Signed)
Patient was seen and examined at bedside, denied any chest pain at this time.  Feels improvement after PRBC transfusion. Patient received 2 units of PRBC transfusion, posttransfusion hemoglobin 7.7. Due to chest pain cardiology was consulted, recommended no intervention as chest pain most likely due to anemia. Last dose of Eliquis was at night on 11/11 GI consulted, recommended to wait for 3 days for EGD and push enteroscopy, most likely it will be done on Friday. We will continue PPI twice daily and monitor H&H.

## 2023-08-14 NOTE — Plan of Care (Signed)
  Problem: Education: Goal: Ability to describe self-care measures that may prevent or decrease complications (Diabetes Survival Skills Education) will improve Outcome: Progressing Goal: Individualized Educational Video(s) Outcome: Progressing   Problem: Clinical Measurements: Goal: Ability to maintain clinical measurements within normal limits will improve Outcome: Progressing Goal: Will remain free from infection Outcome: Progressing Goal: Diagnostic test results will improve Outcome: Progressing Goal: Respiratory complications will improve Outcome: Progressing Goal: Cardiovascular complication will be avoided Outcome: Progressing   Problem: Elimination: Goal: Will not experience complications related to bowel motility Outcome: Progressing Goal: Will not experience complications related to urinary retention Outcome: Progressing   Problem: Pain Management: Goal: General experience of comfort will improve Outcome: Progressing   Problem: Safety: Goal: Ability to remain free from injury will improve Outcome: Progressing

## 2023-08-14 NOTE — ED Notes (Signed)
Pt high fall risk on assessment by writer and previous risk scoring. Writer initiates fall alarm on stretcher, ensures bed at lowest position, applies fall risk yellow band on pts wrist, opens door and places door magnet on outside door frame. Pt refuses to allow non-slip socks to be placed with stating, "I don't want those things on. My feet will get too hot." Education on call bell that is shown and within pts reach provided.

## 2023-08-14 NOTE — ED Notes (Signed)
Pt reclining on stretcher in nad with O2 sat 100% on 2L via [place]. Remains oriented x 4 with exception of difficulty with year. States "November 25." Denies any pn.

## 2023-08-14 NOTE — Assessment & Plan Note (Signed)
Sliding scale insulin coverage 

## 2023-08-14 NOTE — Assessment & Plan Note (Addendum)
Acute blood loss anemia, Chronic anticoagulation History of EGD 05/2023 showing gastritis Patient with exertional chest pain, shortness of breath and melanotic stool x months Hemoglobin 4.1, down from 9.1 a month ago Hold Eliquis, hold antiplatelets Continue Protonix infusion Continue transfusion of PRBCs to hemoglobin over 8 GI consulted

## 2023-08-14 NOTE — ED Notes (Signed)
Staff sent to blood bank to obtain unit #2 at this time.

## 2023-08-14 NOTE — Consult Note (Signed)
[facility] CLINIC CARDIOLOGY CONSULT NOTE       Patient ID: Adriana Anderson MRN: [id number] DOB/AGE: 81-Oct-1942 55 y.o.  Admit date: 08/13/2023 Referring Physician Lindajo Royal, MD Primary Physician Gracelyn Nurse, MD Primary Cardiologist Marcina Millard, MD Reason for Consultation Chest pain, severe anemia  HPI: Adriana Anderson is a 82 y.o. female  with a past medical history of coronary artery disease s/p CABG 3 (LIMA-LAD, SVG-LCx and PDA in 2018), paroxsymal atrial fibrillation (Eliquis), hyperlipidemia, hypertension, CKD3a, type 2 diabetes mellitus who presented to the ED on 08/13/2023 for chest pain and melanotic stools. Cardiology was consulted for further evaluation.   Patient states she presented to the ED due to intermittent chest pain that has been occurring for months. Patient reports the chest pain has gotten significantly worse over the past week. States the chest pain is exertional and resolves with rest, any time she does "too much". Patient says she's been also having associated dyspnea on exertion and generalized weakness. Reports her symptoms were significantly worse yesterday which prompted her to call EMS yesterday.   Work up in the ED notable for Na 133, K 4.4, Cr 1.15, Hgb 4.1, Troponin negative 7 > 6. CXR no active cardiopulmonary disease. EKG showed sinus rhythm, rate 92 bpm.  BP and heart rate controlled in the ED.  Given her severe anemia she was given 2 units PRBCs in the ED.   At the time of my evaluation this morning, patient was laying in her hospital bed at an incline with no acute distress. Patient reports she "feels so much better today" compared to yesterday.  She endorses black stools for the last 1.5 year, follows with GI outpatient.  Discussed her recent symptoms which have been present for many months.  However, her symptoms have gotten significantly worse over the past week. Patient reports she saw her primary cardiologist on 08/06/2023 because of her  chest pain and says she was supposed to have a stress test done this morning (11/12). Currently, patient denies chest pain, SOB, or palpitations.    Review of systems complete and found to be negative unless listed above    Past Medical History:  Diagnosis Date   A-fib (HCC)    Acid reflux    Anginal pain (HCC)    Bladder cancer (HCC)    Bladder mass    Coronary artery disease    COVID-19 09/11/2019   test done at [facility]   Dyspnea    GERD (gastroesophageal reflux disease)    History of biopsy of bladder    History of cystoscopy    HLD (hyperlipidemia)    Hypertension    Liver disease    Motion sickness    boats   PONV (postoperative nausea and vomiting)    Pre-diabetes     Past Surgical History:  Procedure Laterality Date   APPENDECTOMY     BIOPSY  05/31/2023   Procedure: BIOPSY;  Surgeon: Jaynie Collins, DO;  Location: [facility] ENDOSCOPY;  Service: Gastroenterology;;   CATARACT EXTRACTION W/PHACO Right 12/10/2019   Procedure: CATARACT EXTRACTION PHACO AND INTRAOCULAR LENS PLACEMENT (IOC) RIGHT 5.43 00:53.3 10.2%;  Surgeon: Lockie Mola, MD;  Location: [facility] SURGERY CNTR;  Service: Ophthalmology;  Laterality: Right;  has to go day before surgery for covid testing per PAT testing   CHOLECYSTECTOMY     CORONARY ARTERY BYPASS GRAFT  03/07/2017   3 vessel - [facility]   CYSTOSCOPY WITH BIOPSY N/A 11/15/2015   Procedure: CYSTOSCOPY WITH BIOPSY;  Surgeon:  Vanna Scotland, MD;  Location: [facility] ORS;  Service: Urology;  Laterality: N/A;   ESOPHAGOGASTRODUODENOSCOPY N/A 05/31/2023   Procedure: ESOPHAGOGASTRODUODENOSCOPY (EGD);  Surgeon: Jaynie Collins, DO;  Location: [facility] ENDOSCOPY;  Service: Gastroenterology;  Laterality: N/A;   INTRAOPERATIVE CHOLANGIOGRAM  08/12/2019   Procedure: INTRAOPERATIVE CHOLANGIOGRAM;  Surgeon: Leafy Ro, MD;  Location: [facility] ORS;  Service: General;;   IR PERC CHOLECYSTOSTOMY  05/23/2019   LEFT HEART CATH AND CORONARY ANGIOGRAPHY  Left 02/23/2017   Procedure: Left Heart Cath and Coronary Angiography;  Surgeon: Marcina Millard, MD;  Location: [facility] INVASIVE CV LAB;  Service: Cardiovascular;  Laterality: Left;   MALONEY DILATION  05/31/2023   Procedure: Elease Hashimoto DILATION;  Surgeon: Jaynie Collins, DO;  Location: [facility] ENDOSCOPY;  Service: Gastroenterology;;   TOE SURGERY      (Not in a hospital admission)  Social History   Socioeconomic History   Marital status: Married    Spouse name: Not on file   Number of children: Not on file   Years of education: Not on file   Highest education level: Not on file  Occupational History   Not on file  Tobacco Use   Smoking status: Former    Current packs/day: 0.00    Types: Cigarettes    Quit date: 04/20/1982    Years since quitting: 41.3    Passive exposure: Past   Smokeless tobacco: Never  Vaping Use   Vaping status: Never Used  Substance and Sexual Activity   Alcohol use: Yes    Alcohol/week: 0.0 standard drinks of alcohol   Drug use: No   Sexual activity: Not Currently  Other Topics Concern   Not on file  Social History Narrative   Not on file   Social Determinants of Health   Financial Resource Strain: Not on file  Food Insecurity: Not on file  Transportation Needs: Not on file  Physical Activity: Not on file  Stress: Not on file  Social Connections: Not on file  Intimate Partner Violence: Not on file    Family History  Problem Relation Age of Onset   Bladder Cancer Brother        x 3    Brain cancer Father    Kidney disease Father    Diabetes Other    Heart attack Mother    Heart failure Sister    Bladder Cancer Brother    Prostate cancer Neg Hx    Kidney cancer Neg Hx      Vitals:   08/14/23 0817 08/14/23 0900 08/14/23 1401 08/14/23 1402  BP:  (!) 129/55  115/89  Pulse:  94  95  Resp:  20  14  Temp: 98 F (36.7 C) 98.1 F (36.7 C) 98 F (36.7 C)   TempSrc: Oral Oral Oral   SpO2:  99%  100%  Weight:      Height:         PHYSICAL EXAM General: well-appearing elderly women, well nourished, in no acute distress, laying in her hospital bed at an incline. HEENT: Normocephalic and atraumatic. Neck: No JVD.   Lungs: Normal respiratory effort on RA. Clear bilaterally to auscultation. No wheezes, crackles, rhonchi.  Heart: HRRR. Normal S1 and S2 without gallops or murmurs.  Abdomen: Non-distended appearing.  Msk: Normal strength and tone for age. Extremities: Warm and well perfused. No clubbing, cyanosis. No edema.  Neuro: Alert and oriented X 3. Psych: Answers questions appropriately.   Labs: Basic Metabolic Panel: Recent Labs    08/13/23 2017  NA 133*  K 4.4  CL 106  CO2 22  GLUCOSE 151*  BUN 28*  CREATININE 1.15*  CALCIUM 8.0*   Liver Function Tests: Recent Labs    08/13/23 2017  AST 15  ALT 10  ALKPHOS 30*  BILITOT 0.9  PROT 5.3*  ALBUMIN 3.0*   No results for input(s): "LIPASE", "AMYLASE" in the last 72 hours. CBC: Recent Labs    08/13/23 2017 08/14/23 0556 08/14/23 0836  WBC 11.2* 8.5  --   NEUTROABS 8.7*  --   --   HGB 4.1* 6.6* 7.7*  HCT 13.2* 20.4* 23.3*  MCV 98.5 94.4  --   PLT 231 221  --    Cardiac Enzymes: Recent Labs    08/13/23 2017 08/13/23 2243  TROPONINIHS 7 6   BNP: No results for input(s): "BNP" in the last 72 hours. D-Dimer: No results for input(s): "DDIMER" in the last 72 hours. Hemoglobin A1C: Recent Labs    08/14/23 0542  HGBA1C 5.2   Fasting Lipid Panel: No results for input(s): "CHOL", "HDL", "LDLCALC", "TRIG", "CHOLHDL", "LDLDIRECT" in the last 72 hours. Thyroid Function Tests: No results for input(s): "TSH", "T4TOTAL", "T3FREE", "THYROIDAB" in the last 72 hours.  Invalid input(s): "FREET3" Anemia Panel: Recent Labs    08/14/23 0943  FOLATE 7.0  TIBC 267  IRON 188*     Radiology: CT ANGIO GI BLEED  Result Date: 08/14/2023 CLINICAL DATA:  Melanotic stools, anemic, noticeable tenderness to palpation in the right lower  quadrant and right upper quadrant EXAM: CTA ABDOMEN AND PELVIS WITHOUT AND WITH CONTRAST TECHNIQUE: Multidetector CT imaging of the abdomen and pelvis was performed using the standard protocol during bolus administration of intravenous contrast. Multiplanar reconstructed images and MIPs were obtained and reviewed to evaluate the vascular anatomy. RADIATION DOSE REDUCTION: This exam was performed according to the departmental dose-optimization program which includes automated exposure control, adjustment of the mA and/or kV according to patient size and/or use of iterative reconstruction technique. CONTRAST:  OMNIPAQUE IOHEXOL 350 MG/ML SOLN COMPARISON:  CT abdomen and pelvis 08/02/2022 FINDINGS: VASCULAR Scattered calcified atherosclerotic plaque without hemodynamically significant stenosis. No aortic aneurysm or dissection. The mesenteric and renal arteries are patent. Patent inflow and outflow arteries bilaterally. Review of the MIP images confirms the above findings. NON-VASCULAR Lower chest: No acute abnormality. Hepatobiliary: Cholecystectomy. No biliary dilation. Unremarkable liver. Pancreas: Unremarkable. Spleen: Unremarkable. Adrenals/Urinary Tract: Unremarkable adrenal glands. Bilateral cortical renal scarring. No urinary calculi or hydronephrosis. Unremarkable bladder. Stomach/Bowel: Normal caliber large and small bowel. The appendix is not visualized. Stomach is within normal limits. No evidence of active GI bleeding. Lymphatic: No lymphadenopathy. Reproductive: Unremarkable. Other: No free intraperitoneal fluid or air. Musculoskeletal: No acute fracture. IMPRESSION: 1. No evidence of active GI bleeding. 2. No acute abnormality in the abdomen or pelvis. Aortic Atherosclerosis (ICD10-I70.0). Electronically Signed   By: Minerva Fester M.D.   On: 08/14/2023 00:18   DG Chest Portable 1 View  Result Date: 08/13/2023 CLINICAL DATA:  Chest pain EXAM: PORTABLE CHEST 1 VIEW COMPARISON:  Chest x-ray  08/23/2019 FINDINGS: Sternotomy wires are again seen. There is linear scarring or atelectasis in the left lung base. The lungs are otherwise clear. The heart size and mediastinal contours are within normal limits. No pleural effusion or pneumothorax. The visualized skeletal structures are unremarkable. IMPRESSION: Linear scarring or atelectasis in the left lung base. Electronically Signed   By: Darliss Cheney M.D.   On: 08/13/2023 23:25    ECHO 2019: INTERPRETATION  NORMAL  LEFT VENTRICULAR SYSTOLIC FUNCTION   WITH MILD LVH  NORMAL RIGHT VENTRICULAR SYSTOLIC FUNCTION  MODERATE VALVULAR REGURGITATION (See above)  NO VALVULAR STENOSIS  MODERATE MR  MILD AR, TR  EF 55%   Stress test 04/2021: Normal left ventricular function, normal wall motion. EF = 73%  TELEMETRY reviewed by me 08/14/2023: SR, rate 90s  EKG reviewed by me: sinus rhythm, rate 92 bpm.  Data reviewed by me 08/14/2023: last 24h vitals tele labs imaging I/O ED provider note, admission H&P  Principal Problem:   Melena Active Problems:   Type 2 diabetes mellitus (HCC)   Acute blood loss anemia   Atrial fibrillation (HCC)   Chest pain with high risk for cardiac etiology   S/P CABG x 3   History of bladder cancer   HTN (hypertension)   Stage 3a chronic kidney disease (HCC)   Chronic anticoagulation    ASSESSMENT AND PLAN:  Adriana Anderson is a 82 y.o. female  with a past medical history of coronary artery disease s/p CABG 3, LIMA-LAD, SVG-LCx and PDA (03/07/2017), paroxsymal atrial fibrillation (Eliquis), hyperlipidemia, hypertension, CKD3a, type 2 diabetes mellitus who presented to the ED on 08/13/2023 for chest pain and melanotic stools. Cardiology was consulted for further evaluation.   # Severe acute anemia  # Melena  Patient presented to the ED with exertional chest, SOB and melanotic stools. Patient has intermittent chest pain and SOB for months, however became significantly worse this past week. Upon admission, Hgb  4.1 and troponins negative x2.  S/p 2 units PRBCs. Most recent Hgb 6.6. Upon evaluation this AM, patient feeling much better today after transfusion.  -Monitor CBC, goal Hgb > 8.  -Hold anticoagulation and antiplatelets.  -GI following, planning for endoscopy Friday.  # Chest pain # CAD s/p CABG x3 (03/2017) # Hypertension # Hyperlipidemia  Troponin negative 7 > 6. EKG showed sinus rhythm, rate 92 bpm (non-acute) upon admission. Exertional chest pain and dyspnea most likely 2/2 acute anemia. BP and HR stable.  -Hold ASA due to severe anemia.  -Continue home atorvastatin 40 mg  -Home imdur held.  -No plan for further cardiac diagnostics. Suspect symptoms are 2/2 severe anemia.   # Paroxsymal atrial fibrillation  Patient is in NSR and rate is controlled.  -Continue holding eliquis.   This patient's plan of care was discussed and created with Dr. Juliann Pares and he is in agreement.  Signed: Gale Journey, PA-C  08/14/2023, 2:11 PM [facility]

## 2023-08-14 NOTE — Assessment & Plan Note (Signed)
No acute issues suspected 

## 2023-08-14 NOTE — ED Provider Notes (Signed)
Pt received in signout from Dr. Anner Crete. Hx gastritis, EGD in April suggestive of this. On eliquis for Afib. Presents with melena and symptomatic anemia. Hgb 4, getting blood, PPI and is stable. CTA GI bleed study with acute features, active extrav or other etiologies of pain. Likely bleeding gastritis. She is asymptomatic after morphine. Remains stable. No current indications to reverse eliquis. I consult with medicine who agrees to admit.   Marland Kitchen1-3 Lead EKG Interpretation  Performed by: Delton Prairie, MD Authorized by: Delton Prairie, MD     Interpretation: abnormal     ECG rate:  100   ECG rate assessment: tachycardic     Rhythm: sinus tachycardia     Ectopy: none     Conduction: normal   .Critical Care  Performed by: Delton Prairie, MD Authorized by: Delton Prairie, MD   Critical care provider statement:    Critical care time (minutes):  30   Critical care time was exclusive of:  Separately billable procedures and treating other patients   Critical care was necessary to treat or prevent imminent or life-threatening deterioration of the following conditions:  Circulatory failure and cardiac failure   Critical care was time spent personally by me on the following activities:  Development of treatment plan with patient or surrogate, discussions with consultants, evaluation of patient's response to treatment, examination of patient, ordering and review of laboratory studies, ordering and review of radiographic studies, ordering and performing treatments and interventions, pulse oximetry, re-evaluation of patient's condition and review of old charts      Delton Prairie, MD 08/14/23 [phone]

## 2023-08-14 NOTE — Assessment & Plan Note (Signed)
 Renal function at baseline

## 2023-08-14 NOTE — ED Notes (Signed)
Pt brought to rm 33 by this EDT and EDT Tracey. Pt moved to bed and hooked up to cardiac monitor. Pt call bell within reach. No further needs at this time. RN made aware that pt is in room.

## 2023-08-14 NOTE — Assessment & Plan Note (Signed)
Chronic anticoagulation Holding Eliquis due to GI bleed

## 2023-08-14 NOTE — ED Notes (Addendum)
CBG 114 

## 2023-08-14 NOTE — Assessment & Plan Note (Addendum)
CAD s/p CABG x 3 Suspect chest pain is related to symptomatic anemia EKG nonacute, troponin 6 Patient was evaluated by cardiology on 11/4 with plans for nuclear stress test Hold antiplatelets and BP lowering cardiac meds in the setting of acute GI bleed Treat anemia as outlined above which might provide symptomatic relief Morphine as needed Will consult cardiology, though low suspicion for ACS

## 2023-08-15 ENCOUNTER — Other Ambulatory Visit: Payer: Medicare PPO | Admitting: Urology

## 2023-08-15 DIAGNOSIS — R079 Chest pain, unspecified: Secondary | ICD-10-CM

## 2023-08-15 DIAGNOSIS — D62 Acute posthemorrhagic anemia: Secondary | ICD-10-CM

## 2023-08-15 DIAGNOSIS — K921 Melena: Secondary | ICD-10-CM | POA: Diagnosis not present

## 2023-08-15 LAB — PHOSPHORUS: Phosphorus: 2.8 mg/dL (ref 2.5–4.6)

## 2023-08-15 LAB — CBC
HCT: 24.6 % — ABNORMAL LOW (ref 36.0–46.0)
Hemoglobin: 8 g/dL — ABNORMAL LOW (ref 12.0–15.0)
MCH: 30.5 pg (ref 26.0–34.0)
MCHC: 32.5 g/dL (ref 30.0–36.0)
MCV: 93.9 fL (ref 80.0–100.0)
Platelets: 218 10*3/uL (ref 150–400)
RBC: 2.62 MIL/uL — ABNORMAL LOW (ref 3.87–5.11)
RDW: 15.4 % (ref 11.5–15.5)
WBC: 11.8 10*3/uL — ABNORMAL HIGH (ref 4.0–10.5)
nRBC: 0 % (ref 0.0–0.2)

## 2023-08-15 LAB — TYPE AND SCREEN
ABO/RH(D): A NEG
Antibody Screen: NEGATIVE
Unit division: 0
Unit division: 0

## 2023-08-15 LAB — GLUCOSE, CAPILLARY
Glucose-Capillary: 110 mg/dL — ABNORMAL HIGH (ref 70–99)
Glucose-Capillary: 129 mg/dL — ABNORMAL HIGH (ref 70–99)
Glucose-Capillary: 119 mg/dL — ABNORMAL HIGH (ref 70–99)
Glucose-Capillary: 101 mg/dL — ABNORMAL HIGH (ref 70–99)
Glucose-Capillary: 124 mg/dL — ABNORMAL HIGH (ref 70–99)

## 2023-08-15 LAB — BASIC METABOLIC PANEL
Anion gap: 6 (ref 5–15)
BUN: 15 mg/dL (ref 8–23)
CO2: 23 mmol/L (ref 22–32)
Calcium: 7.9 mg/dL — ABNORMAL LOW (ref 8.9–10.3)
Chloride: 108 mmol/L (ref 98–111)
Creatinine, Ser: 1.03 mg/dL — ABNORMAL HIGH (ref 0.44–1.00)
GFR, Estimated: 55 mL/min — ABNORMAL LOW (ref >60)
Glucose, Bld: 113 mg/dL — ABNORMAL HIGH (ref 70–99)
Potassium: 3.9 mmol/L (ref 3.5–5.1)
Sodium: 137 mmol/L (ref 135–145)

## 2023-08-15 LAB — BPAM RBC
Blood Product Expiration Date: 202411282359
Blood Product Expiration Date: 202411282359
ISSUE DATE / TIME: 202411112354
ISSUE DATE / TIME: 202411120427
Unit Type and Rh: 600
Unit Type and Rh: 600

## 2023-08-15 LAB — PROTIME-INR
INR: 1.3 — ABNORMAL HIGH (ref 0.8–1.2)
Prothrombin Time: 16.6 s — ABNORMAL HIGH (ref 11.4–15.2)

## 2023-08-15 LAB — MAGNESIUM: Magnesium: 2.3 mg/dL (ref 1.7–2.4)

## 2023-08-15 NOTE — Progress Notes (Signed)
  Progress Note   Patient: Adriana Anderson ZOX:[id number] DOB: 1941/08/18 DOA: 08/13/2023     1 DOS: the patient was seen and examined on 08/15/2023   Brief hospital course: Adriana Anderson is a 82 y.o. female with medical history significant for HTN, DM2, CKD stage III,  A-fib on Eliquis,NSVT,  CAD s/p CABG x 3, bladder cancer in remission, history of melena s/p EGD 05/2023 that showed gastritis but no active bleeding, who presents to the ED with a complaint of exertional chest pain and shortness of breath as well as intermittent melanotic stools for the past few months. Patient hemoglobin was 6.6, received 1 unit PRBC.  Consult from cardiology and GI obtained.   Principal Problem:   Melena Active Problems:   Chest pain with high risk for cardiac etiology   Atrial fibrillation (HCC)   Chronic anticoagulation   Type 2 diabetes mellitus (HCC)   Acute blood loss anemia   S/P CABG x 3   History of bladder cancer   HTN (hypertension)   Stage 3a chronic kidney disease (HCC)   Assessment and Plan:  * Melena Acute blood loss anemia, Chronic anticoagulation History of EGD 05/2023 showing gastritis Patient received 1 unit PRBC, hemoglobin increased to 8.0.  No additional black stools.  No additional short of breath today. Patient has been seen by GI, EGD scheduled on Friday due to recent dosing of Eliquis.  Continue Protonix IV twice a day.  Stable angina. CAD s/p CABG x 3 Patient has been having chest pain and shortness of breath with exertion for few months.  Consistent with stable angina. Patient has been seen by cardiology, no plan for workup at this time. Symptoms probably exacerbated by severe anemia.  Atrial fibrillation (HCC) Chronic anticoagulation Holding Eliquis due to GI bleed  Stage 3a chronic kidney disease (HCC) Renal function at baseline  HTN (hypertension) Will hold antihypertensives in the setting of GI bleed  History of bladder cancer No acute issues  suspected  Type 2 diabetes mellitus (HCC) Sliding scale insulin coverage      Subjective:  Patient feels better today, no nausea vomiting or diarrhea.  Physical Exam: Vitals:   08/14/23 2333 08/15/23 0437 08/15/23 0851 08/15/23 1240  BP: (!) 144/59 (!) 122/43 (!) 132/54 (!) 171/62  Pulse: 95 84 90 91  Resp: 18 18 16 16   Temp: 98.5 F (36.9 C) 98.1 F (36.7 C) 98.2 F (36.8 C) 98.2 F (36.8 C)  TempSrc: Oral Oral    SpO2: 100% 100% (!) 87% (!) 75%  Weight:      Height:       General exam: Appears calm and comfortable  Respiratory system: Clear to auscultation. Respiratory effort normal. Cardiovascular system: S1 & S2 heard, RRR. No JVD, murmurs, rubs, gallops or clicks. No pedal edema. Gastrointestinal system: Abdomen is nondistended, soft and nontender. No organomegaly or masses felt. Normal bowel sounds heard. Central nervous system: Alert and oriented. No focal neurological deficits. Extremities: Symmetric 5 x 5 power. Skin: No rashes, lesions or ulcers Psychiatry: Judgement and insight appear normal. Mood & affect appropriate.    Data Reviewed:  Lab results reviewed.  Family Communication: Husband at bedside updated.  Disposition: Status is: Inpatient Remains inpatient appropriate because: Severity of disease, IV treatment.     Time spent: 35 minutes  Author: Marrion Coy, MD 08/15/2023 1:34 PM  For on call review [email or web address].

## 2023-08-15 NOTE — Plan of Care (Signed)

## 2023-08-15 NOTE — Hospital Course (Addendum)
Adriana Anderson is a 82 y.o. female with medical history significant for HTN, DM2, CKD stage III,  A-fib on Eliquis,NSVT,  CAD s/p CABG x 3, bladder cancer in remission, history of melena s/p EGD 05/2023 that showed gastritis but no active bleeding, who presents to the ED with a complaint of exertional chest pain and shortness of breath as well as intermittent melanotic stools for the past few months. Patient hemoglobin was 6.6, received 1 unit PRBC.  Consult from cardiology and GI obtained. 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 Patient was monitored for another day after procedure, hemoglobin still 8.0, stable, medically stable for discharge.  Continue PPI twice a day, follow-up with GI and PCP as outpatient.

## 2023-08-15 NOTE — Progress Notes (Signed)
[facility] CLINIC CARDIOLOGY PROGRESS NOTE       Patient ID: Adriana Anderson MRN: [id number] DOB/AGE: [birth year] 82 y.o.  Admit date: 08/13/2023 Referring Physician Lindajo Royal, MD Primary Physician Gracelyn Nurse, MD Primary Cardiologist Marcina Millard, MD Reason for Consultation Chest pain, severe anemia  HPI: Adriana Anderson is a 82 y.o. female  with a past medical history of coronary artery disease s/p CABG 3 (LIMA-LAD, SVG-LCx and PDA in 2018), paroxsymal atrial fibrillation (Eliquis), hyperlipidemia, hypertension, CKD3a, type 2 diabetes mellitus who presented to the ED on 08/13/2023 for chest pain and melanotic stools. Cardiology was consulted for further evaluation.   Interval History -Patient laying in hospital bed at an incline with family at bedside. Patient reports feeling "so much better".  -Patient states she ambulated with no chest pain or shortness of breath. -Hemoglobin remains stable this morning.   Review of systems complete and found to be negative unless listed above    Past Medical History:  Diagnosis Date   A-fib (HCC)    Acid reflux    Anginal pain (HCC)    Bladder cancer (HCC)    Bladder mass    Coronary artery disease    COVID-19 09/11/2019   test done at [facility]   Dyspnea    GERD (gastroesophageal reflux disease)    History of biopsy of bladder    History of cystoscopy    HLD (hyperlipidemia)    Hypertension    Liver disease    Motion sickness    boats   PONV (postoperative nausea and vomiting)    Pre-diabetes     Past Surgical History:  Procedure Laterality Date   APPENDECTOMY     BIOPSY  05/31/2023   Procedure: BIOPSY;  Surgeon: Jaynie Collins, DO;  Location: [facility] ENDOSCOPY;  Service: Gastroenterology;;   CATARACT EXTRACTION W/PHACO Right 12/10/2019   Procedure: CATARACT EXTRACTION PHACO AND INTRAOCULAR LENS PLACEMENT (IOC) RIGHT 5.43 00:53.3 10.2%;  Surgeon: Lockie Mola, MD;  Location: [facility] SURGERY [facility];   Service: Ophthalmology;  Laterality: Right;  has to go day before surgery for covid testing per PAT testing   CHOLECYSTECTOMY     CORONARY ARTERY BYPASS GRAFT  03/07/2017   3 vessel - [facility]   CYSTOSCOPY WITH BIOPSY N/A 11/15/2015   Procedure: CYSTOSCOPY WITH BIOPSY;  Surgeon: Vanna Scotland, MD;  Location: [facility] ORS;  Service: Urology;  Laterality: N/A;   ESOPHAGOGASTRODUODENOSCOPY N/A 05/31/2023   Procedure: ESOPHAGOGASTRODUODENOSCOPY (EGD);  Surgeon: Jaynie Collins, DO;  Location: [facility] ENDOSCOPY;  Service: Gastroenterology;  Laterality: N/A;   INTRAOPERATIVE CHOLANGIOGRAM  08/12/2019   Procedure: INTRAOPERATIVE CHOLANGIOGRAM;  Surgeon: Leafy Ro, MD;  Location: [facility] ORS;  Service: General;;   IR PERC CHOLECYSTOSTOMY  05/23/2019   LEFT HEART CATH AND CORONARY ANGIOGRAPHY Left 02/23/2017   Procedure: Left Heart Cath and Coronary Angiography;  Surgeon: Marcina Millard, MD;  Location: [facility] INVASIVE CV LAB;  Service: Cardiovascular;  Laterality: Left;   MALONEY DILATION  05/31/2023   Procedure: Elease Hashimoto DILATION;  Surgeon: Jaynie Collins, DO;  Location: [facility] ENDOSCOPY;  Service: Gastroenterology;;   TOE SURGERY      Facility-Administered Medications Prior to Admission  Medication Dose Route Frequency Provider Last Rate Last Admin   indocyanine green (IC-GREEN) injection 7.5 mg  7.5 mg Intravenous Once Pabon, Diego F, MD       Medications Prior to Admission  Medication Sig Dispense Refill Last Dose   apixaban (ELIQUIS) 5 MG TABS tablet Take 5 mg by mouth 2 (two)  times daily.    08/13/2023 at 1900   aspirin EC 81 MG tablet Take 81 mg by mouth at bedtime.   Past Week   atorvastatin (LIPITOR) 40 MG tablet Take 1 tablet by mouth daily.   08/13/2023   Calcium Carbonate-Vitamin D (CALTRATE 600+D PO) Take 1 tablet by mouth daily.    08/13/2023   estradiol (ESTRACE) 0.1 MG/GM vaginal cream APPLY A PEA SIZED AMOUNT TO TIP OF FINGER TO URETHRA BEFORE BED. WASH HANDS WELL AFTER  APPLICATION 42.5 g 3 Past Week   famotidine (PEPCID) 20 MG tablet Take 1 tablet by mouth at bedtime.   Past Week   isosorbide mononitrate (IMDUR) 30 MG 24 hr tablet Take 30 mg by mouth daily.    08/13/2023   loratadine (CLARITIN) 10 MG tablet Take 10 mg by mouth daily as needed for allergies.   prn at unknown   metoprolol tartrate (LOPRESSOR) 25 MG tablet Take 25 mg by mouth 2 (two) times daily.   08/13/2023   Multiple Vitamins-Minerals (OCUVITE PRESERVISION PO) Take 1 tablet by mouth daily.   08/13/2023   nitrofurantoin, macrocrystal-monohydrate, (MACROBID) 100 MG capsule TAKE 1 CAPSULE BY MOUTH EVERY DAY 90 capsule 3 08/13/2023   nitroGLYCERIN 2.5 MG CR capsule Take 2.5 mg by mouth daily.    08/13/2023 at 0700   ondansetron (ZOFRAN-ODT) 4 MG disintegrating tablet Take 4 mg by mouth every 8 (eight) hours as needed.   prn at unknown   pantoprazole (PROTONIX) 40 MG tablet Take 40 mg by mouth 2 (two) times daily before a meal.  11 08/13/2023   solifenacin (VESICARE) 10 MG tablet TAKE 1 TABLET BY MOUTH EVERY DAY 90 tablet 3 08/13/2023   Social History   Socioeconomic History   Marital status: Married    Spouse name: Not on file   Number of children: Not on file   Years of education: Not on file   Highest education level: Not on file  Occupational History   Not on file  Tobacco Use   Smoking status: Former    Current packs/day: 0.00    Types: Cigarettes    Quit date: 04/20/1982    Years since quitting: 41.3    Passive exposure: Past   Smokeless tobacco: Never  Vaping Use   Vaping status: Never Used  Substance and Sexual Activity   Alcohol use: Yes    Alcohol/week: 0.0 standard drinks of alcohol   Drug use: No   Sexual activity: Not Currently  Other Topics Concern   Not on file  Social History Narrative   Not on file   Social Determinants of Health   Financial Resource Strain: Not on file  Food Insecurity: No Food Insecurity (08/14/2023)   Hunger Vital Sign    Worried About  Running Out of Food in the Last Year: Never true    Ran Out of Food in the Last Year: Never true  Transportation Needs: No Transportation Needs (08/14/2023)   PRAPARE - Administrator, Civil Service (Medical): No    Lack of Transportation (Non-Medical): No  Physical Activity: Not on file  Stress: Not on file  Social Connections: Not on file  Intimate Partner Violence: Not At Risk (08/14/2023)   Humiliation, Afraid, Rape, and Kick questionnaire    Fear of Current or Ex-Partner: No    Emotionally Abused: No    Physically Abused: No    Sexually Abused: No    Family History  Problem Relation Age of Onset   Bladder  Cancer Brother        x 3    Brain cancer Father    Kidney disease Father    Diabetes Other    Heart attack Mother    Heart failure Sister    Bladder Cancer Brother    Prostate cancer Neg Hx    Kidney cancer Neg Hx      Vitals:   08/14/23 2015 08/14/23 2333 08/15/23 0437 08/15/23 0851  BP: (!) 157/87 (!) 144/59 (!) 122/43 (!) 132/54  Pulse: (!) 102 95 84 90  Resp: 18 18 18 16   Temp: 98.5 F (36.9 C) 98.5 F (36.9 C) 98.1 F (36.7 C) 98.2 F (36.8 C)  TempSrc:  Oral Oral   SpO2: 100% 100% 100% (!) 87%  Weight:      Height:        PHYSICAL EXAM General: well-appearing elderly women, well nourished, in no acute distress, laying in her hospital bed at an incline with family present at bedside. HEENT: Normocephalic and atraumatic. Neck: No JVD.   Lungs: Normal respiratory effort on RA. Clear bilaterally to auscultation. No wheezes, crackles, rhonchi.  Heart: HRRR. Normal S1 and S2 without gallops or murmurs.  Abdomen: Non-distended appearing.  Msk: Normal strength and tone for age. Extremities: Warm and well perfused. No clubbing, cyanosis. No edema.  Neuro: Alert and oriented X 3. Psych: Answers questions appropriately.   Labs: Basic Metabolic Panel: Recent Labs    08/13/23 2017 08/15/23 0415  NA 133* 137  K 4.4 3.9  CL 106 108  CO2 22  23  GLUCOSE 151* 113*  BUN 28* 15  CREATININE 1.15* 1.03*  CALCIUM 8.0* 7.9*  MG  --  2.3  PHOS  --  2.8   Liver Function Tests: Recent Labs    08/13/23 2017  AST 15  ALT 10  ALKPHOS 30*  BILITOT 0.9  PROT 5.3*  ALBUMIN 3.0*   No results for input(s): "LIPASE", "AMYLASE" in the last 72 hours. CBC: Recent Labs    08/13/23 2017 08/14/23 0556 08/14/23 0836 08/14/23 1746 08/15/23 0415  WBC 11.2* 8.5  --   --  11.8*  NEUTROABS 8.7*  --   --   --   --   HGB 4.1* 6.6*   < > 7.7* 8.0*  HCT 13.2* 20.4*   < > 23.3* 24.6*  MCV 98.5 94.4  --   --  93.9  PLT 231 221  --   --  218   < > = values in this interval not displayed.   Cardiac Enzymes: Recent Labs    08/13/23 2017 08/13/23 2243  TROPONINIHS 7 6   BNP: No results for input(s): "BNP" in the last 72 hours. D-Dimer: No results for input(s): "DDIMER" in the last 72 hours. Hemoglobin A1C: Recent Labs    08/14/23 0542  HGBA1C 5.2   Fasting Lipid Panel: No results for input(s): "CHOL", "HDL", "LDLCALC", "TRIG", "CHOLHDL", "LDLDIRECT" in the last 72 hours. Thyroid Function Tests: No results for input(s): "TSH", "T4TOTAL", "T3FREE", "THYROIDAB" in the last 72 hours.  Invalid input(s): "FREET3" Anemia Panel: Recent Labs    08/14/23 0943  VITAMINB12 514  FOLATE 7.0  TIBC 267  IRON 188*     Radiology: CT ANGIO GI BLEED  Result Date: 08/14/2023 CLINICAL DATA:  Melanotic stools, anemic, noticeable tenderness to palpation in the right lower quadrant and right upper quadrant EXAM: CTA ABDOMEN AND PELVIS WITHOUT AND WITH CONTRAST TECHNIQUE: Multidetector CT imaging of the abdomen and pelvis was  performed using the standard protocol during bolus administration of intravenous contrast. Multiplanar reconstructed images and MIPs were obtained and reviewed to evaluate the vascular anatomy. RADIATION DOSE REDUCTION: This exam was performed according to the departmental dose-optimization program which includes automated  exposure control, adjustment of the mA and/or kV according to patient size and/or use of iterative reconstruction technique. CONTRAST:  OMNIPAQUE IOHEXOL 350 MG/ML SOLN COMPARISON:  CT abdomen and pelvis 08/02/2022 FINDINGS: VASCULAR Scattered calcified atherosclerotic plaque without hemodynamically significant stenosis. No aortic aneurysm or dissection. The mesenteric and renal arteries are patent. Patent inflow and outflow arteries bilaterally. Review of the MIP images confirms the above findings. NON-VASCULAR Lower chest: No acute abnormality. Hepatobiliary: Cholecystectomy. No biliary dilation. Unremarkable liver. Pancreas: Unremarkable. Spleen: Unremarkable. Adrenals/Urinary Tract: Unremarkable adrenal glands. Bilateral cortical renal scarring. No urinary calculi or hydronephrosis. Unremarkable bladder. Stomach/Bowel: Normal caliber large and small bowel. The appendix is not visualized. Stomach is within normal limits. No evidence of active GI bleeding. Lymphatic: No lymphadenopathy. Reproductive: Unremarkable. Other: No free intraperitoneal fluid or air. Musculoskeletal: No acute fracture. IMPRESSION: 1. No evidence of active GI bleeding. 2. No acute abnormality in the abdomen or pelvis. Aortic Atherosclerosis (ICD10-I70.0). Electronically Signed   By: Minerva Fester M.D.   On: 08/14/2023 00:18   DG Chest Portable 1 View  Result Date: 08/13/2023 CLINICAL DATA:  Chest pain EXAM: PORTABLE CHEST 1 VIEW COMPARISON:  Chest x-ray 08/23/2019 FINDINGS: Sternotomy wires are again seen. There is linear scarring or atelectasis in the left lung base. The lungs are otherwise clear. The heart size and mediastinal contours are within normal limits. No pleural effusion or pneumothorax. The visualized skeletal structures are unremarkable. IMPRESSION: Linear scarring or atelectasis in the left lung base. Electronically Signed   By: Darliss Cheney M.D.   On: 08/13/2023 23:25    ECHO 2019: INTERPRETATION  NORMAL  LEFT VENTRICULAR SYSTOLIC FUNCTION   WITH MILD LVH  NORMAL RIGHT VENTRICULAR SYSTOLIC FUNCTION  MODERATE VALVULAR REGURGITATION (See above)  NO VALVULAR STENOSIS  MODERATE MR  MILD AR, TR  EF 55%   Stress test 04/2021: Normal left ventricular function, normal wall motion. EF = 73%  TELEMETRY reviewed by me 08/15/2023: SR, rate 80s  EKG reviewed by me: sinus rhythm, rate 92 bpm.  Data reviewed by me 08/15/2023: last 24h vitals tele labs imaging I/O hospitalist progress note  Principal Problem:   Melena Active Problems:   Type 2 diabetes mellitus (HCC)   Acute blood loss anemia   Atrial fibrillation (HCC)   Chest pain with high risk for cardiac etiology   S/P CABG x 3   History of bladder cancer   HTN (hypertension)   Stage 3a chronic kidney disease (HCC)   Chronic anticoagulation    ASSESSMENT AND PLAN:  Adriana Anderson is a 82 y.o. female  with a past medical history of coronary artery disease s/p CABG 3, LIMA-LAD, SVG-LCx and PDA (03/07/2017), paroxsymal atrial fibrillation (Eliquis), hyperlipidemia, hypertension, CKD3a, type 2 diabetes mellitus who presented to the ED on 08/13/2023 for chest pain and melanotic stools. Cardiology was consulted for further evaluation.   # Severe acute anemia  # Melena  Patient presented to the ED with exertional chest, SOB and melanotic stools. Patient has intermittent chest pain and SOB for months, however became significantly worse this past week. Upon admission, Hgb 4.1 and troponins negative x2.  S/p 2 units PRBCs. Most recent Hgb 6.6. Upon evaluation this AM, patient feeling much better today after transfusion.  -Monitor  CBC, goal Hgb > 8.  -Hold anticoagulation and antiplatelets. Goal would be to restart prior to discharge pending results of further GI workup. -GI following, planning for endoscopy Friday.  # Chest pain # CAD s/p CABG x3 (03/2017) # Hypertension # Hyperlipidemia  Troponin negative 7 > 6. EKG showed sinus rhythm, rate  92 bpm (non-acute) upon admission. Exertional chest pain and dyspnea most likely 2/2 acute anemia. BP and HR stable.  -Hold ASA due to severe anemia. See above. -Continue home atorvastatin 40 mg  -Home imdur held.  -No plan for further cardiac diagnostics. Suspect symptoms are 2/2 severe anemia as they resolved with transfusion.   # Paroxsymal atrial fibrillation  Patient is in NSR and rate is controlled.  -Continue holding eliquis. See above.   This patient's plan of care was discussed and created with Dr. Juliann Pares and he is in agreement.  Signed: Gale Journey, PA-C  08/15/2023, 10:31 AM [facility] Cardiology

## 2023-08-16 DIAGNOSIS — Z8719 Personal history of other diseases of the digestive system: Secondary | ICD-10-CM | POA: Diagnosis not present

## 2023-08-16 DIAGNOSIS — I480 Paroxysmal atrial fibrillation: Secondary | ICD-10-CM

## 2023-08-16 DIAGNOSIS — N179 Acute kidney failure, unspecified: Secondary | ICD-10-CM | POA: Diagnosis not present

## 2023-08-16 DIAGNOSIS — K921 Melena: Secondary | ICD-10-CM | POA: Diagnosis not present

## 2023-08-16 DIAGNOSIS — D62 Acute posthemorrhagic anemia: Secondary | ICD-10-CM | POA: Diagnosis not present

## 2023-08-16 DIAGNOSIS — D649 Anemia, unspecified: Secondary | ICD-10-CM | POA: Diagnosis not present

## 2023-08-16 LAB — BASIC METABOLIC PANEL
Anion gap: 8 (ref 5–15)
BUN: 10 mg/dL (ref 8–23)
CO2: 23 mmol/L (ref 22–32)
Calcium: 8.4 mg/dL — ABNORMAL LOW (ref 8.9–10.3)
Chloride: 108 mmol/L (ref 98–111)
Creatinine, Ser: 0.92 mg/dL (ref 0.44–1.00)
GFR, Estimated: >60 mL/min (ref >60)
Glucose, Bld: 107 mg/dL — ABNORMAL HIGH (ref 70–99)
Potassium: 4 mmol/L (ref 3.5–5.1)
Sodium: 139 mmol/L (ref 135–145)

## 2023-08-16 LAB — GLUCOSE, CAPILLARY
Glucose-Capillary: 110 mg/dL — ABNORMAL HIGH (ref 70–99)
Glucose-Capillary: 114 mg/dL — ABNORMAL HIGH (ref 70–99)
Glucose-Capillary: 116 mg/dL — ABNORMAL HIGH (ref 70–99)
Glucose-Capillary: 117 mg/dL — ABNORMAL HIGH (ref 70–99)
Glucose-Capillary: 156 mg/dL — ABNORMAL HIGH (ref 70–99)
Glucose-Capillary: 82 mg/dL (ref 70–99)
Glucose-Capillary: 97 mg/dL (ref 70–99)

## 2023-08-16 LAB — PROTIME-INR
INR: 1.3 — ABNORMAL HIGH (ref 0.8–1.2)
Prothrombin Time: 16.3 s — ABNORMAL HIGH (ref 11.4–15.2)

## 2023-08-16 LAB — PHOSPHORUS: Phosphorus: 3.1 mg/dL (ref 2.5–4.6)

## 2023-08-16 LAB — CBC
HCT: 25.8 % — ABNORMAL LOW (ref 36.0–46.0)
Hemoglobin: 8 g/dL — ABNORMAL LOW (ref 12.0–15.0)
MCH: 30.2 pg (ref 26.0–34.0)
MCHC: 31 g/dL (ref 30.0–36.0)
MCV: 97.4 fL (ref 80.0–100.0)
Platelets: 210 10*3/uL (ref 150–400)
RBC: 2.65 MIL/uL — ABNORMAL LOW (ref 3.87–5.11)
RDW: 15.2 % (ref 11.5–15.5)
WBC: 6.7 10*3/uL (ref 4.0–10.5)
nRBC: 0 % (ref 0.0–0.2)

## 2023-08-16 LAB — MAGNESIUM: Magnesium: 2.3 mg/dL (ref 1.7–2.4)

## 2023-08-16 LAB — ABO/RH: ABO/RH(D): A NEG

## 2023-08-16 NOTE — Plan of Care (Signed)
  Problem: Education: Goal: Ability to describe self-care measures that may prevent or decrease complications (Diabetes Survival Skills Education) will improve Outcome: Progressing Goal: Individualized Educational Video(s) Outcome: Progressing   Problem: Coping: Goal: Ability to adjust to condition or change in health will improve Outcome: Progressing   

## 2023-08-16 NOTE — Progress Notes (Signed)
   Wyline Mood , MD [address], [address], [place], [place], [address] [address], [address], [place], [place], [address] Phone: [phone]  Fax: [phone]   Adriana Anderson is being followed for melena  Day   Subjective: Doing well normal stool color   Objective: Vital signs in last 24 hours: Vitals:   08/15/23 2016 08/15/23 2352 08/16/23 0419 08/16/23 0742  BP: (!) 157/66 (!) 139/55 (!) 137/57 (!) 156/52  Pulse: 96 90 86 92  Resp: 18 18 18 16   Temp: 98.8 F (37.1 C) 97.9 F (36.6 C) (!) 97.4 F (36.3 C) (!) 97.5 F (36.4 C)  TempSrc: Oral Oral Oral Oral  SpO2: 99% 100% 100% 99%  Weight:      Height:       Weight change:   Intake/Output Summary (Last 24 hours) at 08/16/2023 1139 Last data filed at 08/16/2023 0900 Gross per 24 hour  Intake 1560 ml  Output --  Net 1560 ml     Exam: Heart:: Regular rate and rhythm Lungs: normal Abdomen: soft, nontender, normal bowel sounds   Lab Results: @LABTEST2 @ Micro Results: No results found for this or any previous visit (from the past 240 hour(s)). Studies/Results: No results found. Medications: I have reviewed the patient's current medications. Scheduled Meds:  atorvastatin  40 mg Oral Daily   insulin aspart  0-9 Units Subcutaneous Q4H   pantoprazole (PROTONIX) IV  40 mg Intravenous Q12H   sodium chloride flush  10 mL Intravenous Q12H   Continuous Infusions: PRN Meds:.acetaminophen **OR** acetaminophen, morphine injection, ondansetron **OR** ondansetron (ZOFRAN) IV   Assessment: Principal Problem:   Melena Active Problems:   Type 2 diabetes mellitus (HCC)   Acute blood loss anemia   Atrial fibrillation (HCC)   Chest pain with high risk for cardiac etiology   S/P CABG x 3   History of bladder cancer   HTN (hypertension)   Stage 3a chronic kidney disease (HCC)   Chronic anticoagulation   Adriana Anderson is a 82 y.o. y/o female with history of CABG, CAD, atrial fibrillation on Eliquis, GERD  presented to the hospital with melena and a hemoglobin of 4 g.  In 05/22/2023 evaluated by Dr. Timothy Lasso for dysphagia and had her esophagus dilated to 34 [place] and evidence of gastritis was noted with no H. pylori.  At that point of time the patient had macrocytic anemia.  Presently has normocytic anemia, AKI.   Plan 1.  Monitor CBC transfuse as needed 2.  IV PPI 3.  Eloquis  has been held for 3 days.  We will plan for EGD/push enteroscopy tomorrow.  History of melena if negative will require colonoscopy.   I have discussed alternative options, risks & benefits,  which include, but are not limited to, bleeding, infection, perforation,respiratory complication & drug reaction.  The patient agrees with this plan & written consent will be obtained.     LOS: 2 days   Wyline Mood, MD 08/16/2023, 11:39 AM

## 2023-08-16 NOTE — Plan of Care (Signed)

## 2023-08-16 NOTE — Progress Notes (Addendum)
Progress Note   Patient: Adriana Anderson DOB: 10-16-1940 DOA: 08/13/2023     2 DOS: the patient was seen and examined on 08/16/2023   Brief hospital course: Adriana Anderson is a 82 y.o. female with medical history significant for HTN, DM2, CKD stage III,  A-fib on Eliquis,NSVT,  CAD s/p CABG x 3, bladder cancer in remission, history of melena s/p EGD 05/2023 that showed gastritis but no active bleeding, who presents to the ED with a complaint of exertional chest pain and shortness of breath as well as intermittent melanotic stools for the past few months. Patient hemoglobin was 6.6, received 1 unit PRBC.  Consult from cardiology and GI obtained.   Principal Problem:   Melena Active Problems:   Chest pain with high risk for cardiac etiology   Atrial fibrillation (HCC)   Chronic anticoagulation   Type 2 diabetes mellitus (HCC)   Acute blood loss anemia   S/P CABG x 3   History of bladder cancer   HTN (hypertension)   Stage 3a chronic kidney disease (HCC)   Assessment and Plan:  Melena Acute blood loss anemia, Chronic anticoagulation History of EGD 05/2023 showing gastritis Patient received 1 unit PRBC, hemoglobin increased to 8.0.  No additional black stools.  No additional short of breath today. Patient has been seen by GI, EGD scheduled on Friday due to recent dosing of Eliquis.  Continue Protonix IV twice a day. Hemoglobin 8.0 today stable.   Stable angina. CAD s/p CABG x 3 Patient has been having chest pain and shortness of breath with exertion for few months.  Consistent with stable angina. Patient has been seen by cardiology, no plan for workup at this time.  Cardiology has signed off, will need to follow-up with cardiology as outpatient. Symptoms probably exacerbated by severe anemia.   Paroxysmal atrial fibrillation (HCC) Chronic anticoagulation Holding Eliquis due to GI bleed   Stage 3a chronic kidney disease (HCC) Renal function at baseline   HTN  (hypertension) Will hold antihypertensives in the setting of GI bleed   History of bladder cancer No acute issues suspected   Type 2 diabetes mellitus (HCC) Sliding scale insulin coverage      Subjective:  Patient feels better today, no shortness of breath.  No chest pain today.  Physical Exam: Vitals:   08/15/23 2352 08/16/23 0419 08/16/23 0742 08/16/23 1220  BP: (!) 139/55 (!) 137/57 (!) 156/52 (!) 144/57  Pulse: 90 86 92 88  Resp: 18 18 16 16   Temp: 97.9 F (36.6 C) (!) 97.4 F (36.3 C) (!) 97.5 F (36.4 C) (!) 97.3 F (36.3 C)  TempSrc: Oral Oral Oral Oral  SpO2: 100% 100% 99% 99%  Weight:      Height:       General exam: Appears calm and comfortable  Respiratory system: Clear to auscultation. Respiratory effort normal. Cardiovascular system: S1 & S2 heard, RRR. No JVD, murmurs, rubs, gallops or clicks. No pedal edema. Gastrointestinal system: Abdomen is nondistended, soft and nontender. No organomegaly or masses felt. Normal bowel sounds heard. Central nervous system: Alert and oriented. No focal neurological deficits. Extremities: Symmetric 5 x 5 power. Skin: No rashes, lesions or ulcers Psychiatry: Judgement and insight appear normal. Mood & affect appropriate.    Data Reviewed:  Lab results reviewed.  Family Communication: None  Disposition: Status is: Inpatient Remains inpatient appropriate because: Severity of disease, inpatient procedure, IV treatment.     Time spent: 35 minutes  Author: Marrion Coy, MD 08/16/2023  12:25 PM  For on call review [email or web address].

## 2023-08-17 ENCOUNTER — Inpatient Hospital Stay: Payer: Medicare PPO | Admitting: Certified Registered"

## 2023-08-17 ENCOUNTER — Encounter
Admission: EM | Disposition: A | Discharge status: Home / Self Care | Payer: Self-pay | Source: Home / Self Care | Attending: Internal Medicine

## 2023-08-17 DIAGNOSIS — K31811 Angiodysplasia of stomach and duodenum with bleeding: Secondary | ICD-10-CM | POA: Diagnosis not present

## 2023-08-17 DIAGNOSIS — D62 Acute posthemorrhagic anemia: Secondary | ICD-10-CM | POA: Diagnosis not present

## 2023-08-17 DIAGNOSIS — R079 Chest pain, unspecified: Secondary | ICD-10-CM | POA: Diagnosis not present

## 2023-08-17 HISTORY — PX: LASER ABLATION CONDOLAMATA: SHX5941

## 2023-08-17 HISTORY — PX: ENTEROSCOPY: SHX5533

## 2023-08-17 LAB — MAGNESIUM: Magnesium: 2.1 mg/dL (ref 1.7–2.4)

## 2023-08-17 LAB — IRON AND TIBC
Iron: 27 ug/dL — ABNORMAL LOW (ref 28–170)
Saturation Ratios: 11 % (ref 10.4–31.8)
TIBC: 251 ug/dL (ref 250–450)
UIBC: 224 ug/dL

## 2023-08-17 LAB — BASIC METABOLIC PANEL
Anion gap: 9 (ref 5–15)
BUN: 7 mg/dL — ABNORMAL LOW (ref 8–23)
CO2: 22 mmol/L (ref 22–32)
Calcium: 8.4 mg/dL — ABNORMAL LOW (ref 8.9–10.3)
Chloride: 108 mmol/L (ref 98–111)
Creatinine, Ser: 0.92 mg/dL (ref 0.44–1.00)
GFR, Estimated: >60 mL/min (ref >60)
Glucose, Bld: 106 mg/dL — ABNORMAL HIGH (ref 70–99)
Potassium: 3.8 mmol/L (ref 3.5–5.1)
Sodium: 139 mmol/L (ref 135–145)

## 2023-08-17 LAB — CBC
HCT: 25.2 % — ABNORMAL LOW (ref 36.0–46.0)
Hemoglobin: 8 g/dL — ABNORMAL LOW (ref 12.0–15.0)
MCH: 30.2 pg (ref 26.0–34.0)
MCHC: 31.7 g/dL (ref 30.0–36.0)
MCV: 95.1 fL (ref 80.0–100.0)
Platelets: 255 10*3/uL (ref 150–400)
RBC: 2.65 MIL/uL — ABNORMAL LOW (ref 3.87–5.11)
RDW: 15.4 % (ref 11.5–15.5)
WBC: 7.8 10*3/uL (ref 4.0–10.5)
nRBC: 0 % (ref 0.0–0.2)

## 2023-08-17 LAB — GLUCOSE, CAPILLARY
Glucose-Capillary: 105 mg/dL — ABNORMAL HIGH (ref 70–99)
Glucose-Capillary: 120 mg/dL — ABNORMAL HIGH (ref 70–99)
Glucose-Capillary: 188 mg/dL — ABNORMAL HIGH (ref 70–99)
Glucose-Capillary: 109 mg/dL — ABNORMAL HIGH (ref 70–99)

## 2023-08-17 LAB — PROTIME-INR
INR: 1.3 — ABNORMAL HIGH (ref 0.8–1.2)
Prothrombin Time: 16.2 s — ABNORMAL HIGH (ref 11.4–15.2)

## 2023-08-17 LAB — PHOSPHORUS: Phosphorus: 3.7 mg/dL (ref 2.5–4.6)

## 2023-08-17 SURGERY — ENTEROSCOPY
Anesthesia: General | Site: Abdomen

## 2023-08-17 MED ORDER — LIDOCAINE HCL (PF) 2 % IJ SOLN
INTRAMUSCULAR | Status: DC | PRN
  Administered 2023-08-17: 40 mg via INTRADERMAL

## 2023-08-17 MED ORDER — PROPOFOL 10 MG/ML IV BOLUS
INTRAVENOUS | Status: DC | PRN
  Administered 2023-08-17: 20 mg via INTRAVENOUS
  Administered 2023-08-17: 50 mg via INTRAVENOUS
  Administered 2023-08-17 (×3): 20 mg via INTRAVENOUS

## 2023-08-17 MED ORDER — SODIUM CHLORIDE 0.9 % IV SOLN: INTRAVENOUS | Status: DC

## 2023-08-17 NOTE — Anesthesia Preprocedure Evaluation (Signed)
Anesthesia Evaluation  Patient identified by MRN, date of birth, ID band Patient awake    Reviewed: Allergy & Precautions, NPO status , Patient's Chart, lab work & pertinent test results  History of Anesthesia Complications (+) PONV and history of anesthetic complications  Airway Mallampati: III  TM Distance: >3 FB Neck ROM: full    Dental  (+) Teeth Intact   Pulmonary neg pulmonary ROS, shortness of breath, former smoker   Pulmonary exam normal breath sounds clear to auscultation       Cardiovascular Exercise Tolerance: Good hypertension, Pt. on medications + angina  + CAD and + CABG  negative cardio ROS Normal cardiovascular exam Rhythm:Regular Rate:Normal     Neuro/Psych negative neurological ROS  negative psych ROS   GI/Hepatic negative GI ROS, Neg liver ROS,GERD  Medicated,,  Endo/Other  negative endocrine ROSdiabetes, Type 2, Oral Hypoglycemic Agents    Renal/GU negative Renal ROS  negative genitourinary   Musculoskeletal   Abdominal Normal abdominal exam  (+)   Peds negative pediatric ROS (+)  Hematology negative hematology ROS (+)   Anesthesia Other Findings Past Medical History: No date: A-fib (HCC) No date: Acid reflux No date: Anginal pain (HCC) No date: Bladder cancer (HCC) No date: Bladder mass No date: Coronary artery disease 09/11/2019: COVID-19     Comment:  test done at [facility] No date: Dyspnea No date: GERD (gastroesophageal reflux disease) No date: History of biopsy of bladder No date: History of cystoscopy No date: HLD (hyperlipidemia) No date: Hypertension No date: Liver disease No date: Motion sickness     Comment:  boats No date: PONV (postoperative nausea and vomiting) No date: Pre-diabetes  Past Surgical History: No date: APPENDECTOMY 05/31/2023: BIOPSY     Comment:  Procedure: BIOPSY;  Surgeon: Jaynie Collins, DO;               Location: [facility] ENDOSCOPY;   Service: Gastroenterology;; 12/10/2019: CATARACT EXTRACTION W/PHACO; Right     Comment:  Procedure: CATARACT EXTRACTION PHACO AND INTRAOCULAR               LENS PLACEMENT (IOC) RIGHT 5.43 00:53.3 10.2%;  Surgeon:               Lockie Mola, MD;  Location: [facility] SURGERY CNTR;              Service: Ophthalmology;  Laterality: Right;  has to go               day before surgery for covid testing per PAT testing No date: CHOLECYSTECTOMY 03/07/2017: CORONARY ARTERY BYPASS GRAFT     Comment:  3 vessel - [facility] 11/15/2015: CYSTOSCOPY WITH BIOPSY; N/A     Comment:  Procedure: CYSTOSCOPY WITH BIOPSY;  Surgeon: Vanna Scotland, MD;  Location: [facility] ORS;  Service: Urology;                Laterality: N/A; 05/31/2023: ESOPHAGOGASTRODUODENOSCOPY; N/A     Comment:  Procedure: ESOPHAGOGASTRODUODENOSCOPY (EGD);  Surgeon:               Jaynie Collins, DO;  Location: [facility] ENDOSCOPY;                Service: Gastroenterology;  Laterality: N/A; 08/12/2019: INTRAOPERATIVE CHOLANGIOGRAM     Comment:  Procedure: INTRAOPERATIVE CHOLANGIOGRAM;  Surgeon:               Leafy Ro, MD;  Location: [facility] ORS;  Service:               General;; 05/23/2019: IR PERC CHOLECYSTOSTOMY 02/23/2017: LEFT HEART CATH AND CORONARY ANGIOGRAPHY; Left     Comment:  Procedure: Left Heart Cath and Coronary Angiography;                Surgeon: Marcina Millard, MD;  Location: [facility]               INVASIVE CV LAB;  Service: Cardiovascular;  Laterality:               Left; 05/31/2023: MALONEY DILATION     Comment:  Procedure: MALONEY DILATION;  Surgeon: Jaynie Collins, DO;  Location: [facility] ENDOSCOPY;  Service:               Gastroenterology;; No date: TOE SURGERY  BMI    Body Mass Index: 22.47 kg/m      Reproductive/Obstetrics negative OB ROS                             Anesthesia Physical Anesthesia Plan  ASA: 3  Anesthesia Plan: General   Post-op  Pain Management:    Induction: Intravenous  PONV Risk Score and Plan: Propofol infusion and TIVA  Airway Management Planned: Natural Airway and Nasal Cannula  Additional Equipment:   Intra-op Plan:   Post-operative Plan:   Informed Consent: I have reviewed the patients History and Physical, chart, labs and discussed the procedure including the risks, benefits and alternatives for the proposed anesthesia with the patient or authorized representative who has indicated his/her understanding and acceptance.     Dental Advisory Given  Plan Discussed with: CRNA and Surgeon  Anesthesia Plan Comments:        Anesthesia Quick Evaluation

## 2023-08-17 NOTE — H&P (Signed)
Wyline Mood, MD [address], [place], [place], [address] [address], [place], [place], [address] Phone: [phone]  Fax: [phone]  Primary Care Physician:  Gracelyn Nurse, MD   Pre-Procedure History & Physical: HPI:  Adriana Anderson is a 82 y.o. female is here for an endoscopy    Past Medical History:  Diagnosis Date   A-fib [facility])    Acid reflux    Anginal pain (HCC)    Bladder cancer [facility])    Bladder mass    Coronary artery disease    COVID-19 09/11/2019   test done at [facility]   Dyspnea    GERD (gastroesophageal reflux disease)    History of biopsy of bladder    History of cystoscopy    HLD (hyperlipidemia)    Hypertension    Liver disease    Motion sickness    boats   PONV (postoperative nausea and vomiting)    Pre-diabetes     Past Surgical History:  Procedure Laterality Date   APPENDECTOMY     BIOPSY  05/31/2023   Procedure: BIOPSY;  Surgeon: Jaynie Collins, DO;  Location[facility] ENDOSCOPY;  Service: Gastroenterology;;   CATARACT EXTRACTION W/PHACO Right 12/10/2019   Procedure: CATARACT EXTRACTION PHACO AND INTRAOCULAR LENS PLACEMENT (IOC) RIGHT 5.43 00:53.3 10.2%;  Surgeon: Lockie Mola, MD;  Location: [facility] SURGERY [facility]TR;  Service: Ophthalmology;  Laterality: Right;  has to go day before surgery for covid testing per PAT testing   CHOLECYSTECTOMY     CORONARY ARTERY BYPASS GRAFT  03/07/2017   3 vessel - [facility]   CYSTOSCOPY WITH BIOPSY N/A 11/15/2015   Procedure: CYSTOSCOPY WITH BIOPSY;  Surgeon: Vanna Scotland, MD;  Location: [facility] ORS;  Service: Urology;  Laterality: N/A;   ESOPHAGOGASTRODUODENOSCOPY N/A 05/31/2023   Procedure: ESOPHAGOGASTRODUODENOSCOPY (EGD);  Surgeon: Jaynie Collins, DO;  Location: [facility] ENDOSCOPY;  Service: Gastroenterology;  Laterality: N/A;   INTRAOPERATIVE CHOLANGIOGRAM  08/12/2019   Procedure: INTRAOPERATIVE CHOLANGIOGRAM;  Surgeon: Leafy Ro, MD;  Location: [facility] ORS;   Service: General;;   IR PERC CHOLECYSTOSTOMY  05/23/2019   LEFT HEART CATH AND CORONARY ANGIOGRAPHY Left 02/23/2017   Procedure: Left Heart Cath and Coronary Angiography;  Surgeon: Marcina Millard, MD;  Location: [facility] INVASIVE CV LAB;  Service: Cardiovascular;  Laterality: Left;   MALONEY DILATION  05/31/2023   Procedure: Elease Hashimoto DILATION;  Surgeon: Jaynie Collins, DO;  Location: [facility] ENDOSCOPY;  Service: Gastroenterology;;   TOE SURGERY      Prior to Admission medications   Medication Sig Start Date End Date Taking? Authorizing Provider  apixaban (ELIQUIS) 5 MG TABS tablet Take 5 mg by mouth 2 (two) times daily.  03/18/17  Yes [provider]  aspirin EC 81 MG tablet Take 81 mg by mouth at bedtime.   Yes [provider]  atorvastatin (LIPITOR) 40 MG tablet Take 1 tablet by mouth daily. 03/30/21  Yes [provider]  Calcium Carbonate-Vitamin D (CALTRATE 600+D PO) Take 1 tablet by mouth daily.    Yes [provider]  estradiol (ESTRACE) 0.1 MG/GM vaginal cream APPLY A PEA SIZED AMOUNT TO TIP OF FINGER TO URETHRA BEFORE BED. [facility] HANDS WELL AFTER APPLICATION 10/03/22  Yes McGowan, Carollee Herter A, PA-C  famotidine (PEPCID) 20 MG tablet Take 1 tablet by mouth at bedtime. 07/10/23  Yes [provider]  isosorbide mononitrate (IMDUR) 30 MG 24 hr tablet Take 30 mg by mouth daily.  06/03/19  Yes [provider]  loratadine (CLARITIN) 10 MG tablet Take 10 mg by mouth daily as needed for allergies.   Yes [provider]  metoprolol tartrate (LOPRESSOR) 25 MG tablet Take 25 mg by mouth 2 (two) times daily. 07/25/22  Yes [provider]  Multiple Vitamins-Minerals (OCUVITE PRESERVISION PO) Take 1 tablet by mouth daily.   Yes [provider]  nitrofurantoin, macrocrystal-monohydrate, (MACROBID) 100 MG capsule TAKE 1 CAPSULE BY MOUTH EVERY DAY 09/26/22  Yes Sondra Come, MD  nitroGLYCERIN 2.5 MG CR capsule Take 2.5 mg by  mouth daily.    Yes [provider]  ondansetron (ZOFRAN-ODT) 4 MG disintegrating tablet Take 4 mg by mouth every 8 (eight) hours as needed. 07/10/23  Yes [provider]  pantoprazole (PROTONIX) 40 MG tablet Take 40 mg by mouth 2 (two) times daily before a meal. 04/28/18  Yes [provider]  solifenacin (VESICARE) 10 MG tablet TAKE 1 TABLET BY MOUTH EVERY DAY 04/02/23  Yes Sondra Come, MD    Allergies as of 08/13/2023 - Review Complete 08/13/2023  Allergen Reaction Noted   Tetanus-diphth-acell pertussis Swelling 04/21/2015   Sulfa antibiotics Rash 04/21/2015    Family History  Problem Relation Age of Onset   Bladder Cancer Brother        x 3    Brain cancer Father    Kidney disease Father    Diabetes Other    Heart attack Mother    Heart failure Sister    Bladder Cancer Brother    Prostate cancer Neg Hx    Kidney cancer Neg Hx     Social History   Socioeconomic History   Marital status: Married    Spouse name: Not on file   Number of children: Not on file   Years of education: Not on file   Highest education level: Not on file  Occupational History   Not on file  Tobacco Use   Smoking status: Former    Current packs/day: 0.00    Types: Cigarettes    Quit date: 04/20/1982    Years since quitting: 41.3    Passive exposure: Past   Smokeless tobacco: Never  Vaping Use   Vaping status: Never Used  Substance and Sexual Activity   Alcohol use: Yes    Alcohol/week: 0.0 standard drinks of alcohol   Drug use: No   Sexual activity: Not Currently  Other Topics Concern   Not on file  Social History Narrative   Not on file   Social Determinants of Health   Financial Resource Strain: Not on file  Food Insecurity: No Food Insecurity (08/14/2023)   Hunger Vital Sign    Worried About Running Out of Food in the Last Year: Never true    Ran Out of Food in the Last Year: Never true  Transportation Needs: No Transportation Needs (08/14/2023)    PRAPARE - Administrator, Civil Service (Medical): No    Lack of Transportation (Non-Medical): No  Physical Activity: Not on file  Stress: Not on file  Social Connections: Not on file  Intimate Partner Violence: Not At Risk (08/14/2023)   Humiliation, Afraid, Rape, and Kick questionnaire    Fear of Current or Ex-Partner: No    Emotionally Abused: No    Physically Abused: No    Sexually Abused: No    Review of Systems: See HPI, otherwise negative ROS  Physical Exam: BP (!) 158/60   Pulse 92   Temp 97.8 F (36.6 C) (Temporal)  Resp 18   Ht 5\' 5"  (1.651 m)   Wt 61.2 kg   SpO2 100%   BMI 22.47 kg/m  General:   Alert,  pleasant and cooperative in NAD Head:  Normocephalic and atraumatic. Neck:  Supple; no masses or thyromegaly. Lungs:  Clear throughout to auscultation, normal respiratory effort.    Heart:  +S1, +S2, Regular rate and rhythm, No edema. Abdomen:  Soft, nontender and nondistended. Normal bowel sounds, without guarding, and without rebound.   Neurologic:  Alert and  oriented x4;  grossly normal neurologically.  Impression/Plan: Adriana Anderson is here for a push enteroscopy  to be performed for  evaluation of melena    Risks, benefits, limitations, and alternatives regarding endoscopy have been reviewed with the patient.  Questions have been answered.  All parties agreeable.   Wyline Mood, MD  08/17/2023, 12:36 PM

## 2023-08-17 NOTE — Op Note (Signed)
[facility] Patient Name: Adriana Anderson Procedure Date: 08/17/2023 12:37 PM MRN: [id number] Account #: [id number] Date of Birth: [birth year] Admit Type: Inpatient Age: 82 Room: [facility] ENDO ROOM 1 Gender: Female Note Status: Finalized Instrument Name: Peds Colonoscope [id number] Procedure:             Small bowel enteroscopy Indications:           Melena Providers:             Wyline Mood MD, MD Referring MD:          Gracelyn Nurse, MD (Referring MD) Medicines:             Monitored Anesthesia Care Complications:         No immediate complications. Procedure:             Pre-Anesthesia Assessment:                        - Prior to the procedure, a History and Physical was                         performed, and patient medications, allergies and                         sensitivities were reviewed. The patient's tolerance                         of previous anesthesia was reviewed.                        - The risks and benefits of the procedure and the                         sedation options and risks were discussed with the                         patient. All questions were answered and informed                         consent was obtained.                        - ASA Grade Assessment: II - A patient with mild                         systemic disease.                        After obtaining informed consent, the endoscope was                         passed under direct vision. Throughout the procedure,                         the patient's blood pressure, pulse, and oxygen                         saturations were monitored continuously. The                         Colonoscope was introduced  through the mouth and                         advanced to the proximal jejunum. The small bowel                         enteroscopy was accomplished with ease. The patient                         tolerated the procedure well. Findings:      The esophagus was  normal.      The examined duodenum was normal.      There was no evidence of significant pathology in the entire examined       portion of jejunum.      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 Coagulation for hemostasis using argon plasma at 0.5       liters/minute and 20 watts was successful. Impression:            - Normal esophagus.                        - Normal examined duodenum.                        - The examined portion of the jejunum was normal.                        - Gastric antral vascular ectasia with bleeding.                         Treated with argon plasma coagulation (APC).                        - No specimens collected. Recommendation:        - Return patient to hospital ward for ongoing care.                        - Full liquid diet today.                        - Use Prilosec (omeprazole) 40 mg PO BID for 8 weeks.                        - Home in am if stable, follopw up with [facility] her                         primary GI , if has further worsening of anemia                         consider colonoscopy +/- capsule and repeat APC to                         stomach , avoid nsaids Procedure Code(s):     --- Professional ---                        [phone], Small intestinal endoscopy, enteroscopy beyond  second portion of duodenum, not including ileum; with                         control of bleeding (eg, injection, bipolar cautery,                         unipolar cautery, laser, heater probe, stapler, plasma                         coagulator) Diagnosis Code(s):     --- Professional ---                        K92.1, Melena (includes Hematochezia)                        K31.811, Angiodysplasia of stomach and duodenum with                         bleeding CPT copyright 2022 American Medical Association. All rights reserved. The codes documented in this report are preliminary and upon coder  review may  be revised to meet current compliance requirements. Wyline Mood, MD Wyline Mood MD, MD 08/17/2023 1:05:55 PM This report has been signed electronically. Number of Addenda: 0 Note Initiated On: 08/17/2023 12:37 PM Estimated Blood Loss:  Estimated blood loss: none.      [facility]

## 2023-08-17 NOTE — Care Management Important Message (Signed)
Important Message  Patient Details  Name: Adriana Anderson MRN: [id number] Date of Birth: August 30, 1941   Important Message Given:  N/A - LOS <3 / Initial given by admissions     Olegario Messier A Adriana Anderson 08/17/2023, 12:18 PM

## 2023-08-17 NOTE — Anesthesia Postprocedure Evaluation (Signed)
Anesthesia Post Note  Patient: Adriana Anderson  Procedure(s) Performed: ENTEROSCOPY LASER ABLATION CONDOLAMATA (Abdomen)  Patient location during evaluation: PACU Anesthesia Type: General Level of consciousness: awake Pain management: satisfactory to patient Vital Signs Assessment: post-procedure vital signs reviewed and stable Respiratory status: spontaneous breathing Cardiovascular status: stable Anesthetic complications: no   No notable events documented.   Last Vitals:  Vitals:   08/17/23 1149 08/17/23 1307  BP: (!) 158/60   Pulse: 92   Resp: 18   Temp: 36.6 C 36.8 C  SpO2: 100%     Last Pain:  Vitals:   08/17/23 1317  TempSrc:   PainSc: 0-No pain                 VAN STAVEREN,Autie Vasudevan

## 2023-08-17 NOTE — Transfer of Care (Signed)
Immediate Anesthesia Transfer of Care Note  Patient: [name]  Procedure(s) Performed: ENTEROSCOPY LASER ABLATION CONDOLAMATA (Abdomen)  Patient Location: PACU and Endoscopy Unit  Anesthesia Type:MAC  Level of Consciousness: drowsy  Airway & Oxygen Therapy: Patient Spontanous Breathing and Patient connected to nasal cannula oxygen  Post-op Assessment: Report given to RN and Post -op Vital signs reviewed and stable  Post vital signs: Reviewed and stable  Last Vitals:  Vitals Value Taken Time  BP 117/37 08/17/23 1306  Temp    Pulse 92 08/17/23 1306  Resp 15 08/17/23 1306  SpO2 95 % 08/17/23 1306  Vitals shown include unfiled device data.  Last Pain:  Vitals:   08/17/23 1149  TempSrc: Temporal  PainSc: 0-No pain      Patients Stated Pain Goal: 0 (08/15/23 2100)  Complications: No notable events documented.

## 2023-08-17 NOTE — Progress Notes (Signed)
Progress Note   Patient: Adriana Anderson NWG:[id number] DOB: 1941-08-25 DOA: 08/13/2023     3 DOS: the patient was seen and examined on 08/17/2023   Brief hospital course: Adriana Anderson is a 82 y.o. female with medical history significant for HTN, DM2, CKD stage III,  A-fib on Eliquis,NSVT,  CAD s/p CABG x 3, bladder cancer in remission, history of melena s/p EGD 05/2023 that showed gastritis but no active bleeding, who presents to the ED with a complaint of exertional chest pain and shortness of breath as well as intermittent melanotic stools for the past few months. Patient hemoglobin was 6.6, received 1 unit PRBC.  Consult from cardiology and GI obtained. 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   Hemoglobin since then has been stable, however, patient continued to have black stools.  EGD is performed on 11/15, showed GAVE, with active bleeding.  Treated with argon laser. Will continue PPI twice a day.  Monitor for overnight, most likely will discharge home tomorrow.   Stable angina. CAD s/p CABG x 3 Patient has been having chest pain and shortness of breath with exertion for few months.  Consistent with stable angina. Patient has been seen by cardiology, no plan for workup at this time.  Cardiology has signed off, will need to follow-up with  cardiology as outpatient. Symptoms probably exacerbated by severe anemia.   Paroxysmal atrial fibrillation (HCC) Chronic anticoagulation Holding Eliquis due to GI bleed   Stage 3a chronic kidney disease (HCC) Renal function at baseline   HTN (hypertension) Will hold antihypertensives in the setting of GI bleed   History of bladder cancer No acute issues suspected   Type 2 diabetes mellitus (HCC) Sliding scale insulin coverage     Subjective:  Patient still has some black stools, but no nausea vomiting.  No short of breath or chest pain.  Physical Exam: Vitals:   08/17/23 0335 08/17/23 0817 08/17/23 1149 08/17/23 1307  BP: (!) 146/62 (!) 147/53 (!) 158/60   Pulse: 90 87 92   Resp: 16  18   Temp: 97.7 F (36.5 C) 98 F (36.7 C) 97.8 F (36.6 C) 98.2 F (36.8 C)  TempSrc:   Temporal Temporal  SpO2: 99% 97% 100%   Weight:      Height:       General exam: Appears calm and comfortable  Respiratory system: Clear to auscultation. Respiratory effort normal. Cardiovascular system: S1 & S2 heard, RRR. No JVD, murmurs, rubs, gallops or clicks. No pedal edema. Gastrointestinal system: Abdomen is nondistended, soft and nontender. No organomegaly or masses felt. Normal bowel sounds heard. Central nervous system: Alert and oriented. No focal neurological deficits. Extremities: Symmetric 5 x 5 power. Skin: No rashes, lesions or ulcers Psychiatry: Judgement and insight appear normal. Mood & affect appropriate.    Data Reviewed:  Lab  results reviewed.  Family Communication: Husband updated at bedside.  Disposition: Status is: Inpatient Remains inpatient appropriate because: Severity of disease, IV treatment.     Time spent: 35 minutes  Author: Marrion Coy, MD 08/17/2023 1:33 PM  For on call review [email or web address].

## 2023-08-18 DIAGNOSIS — I480 Paroxysmal atrial fibrillation: Secondary | ICD-10-CM | POA: Diagnosis not present

## 2023-08-18 DIAGNOSIS — K31811 Angiodysplasia of stomach and duodenum with bleeding: Secondary | ICD-10-CM | POA: Diagnosis not present

## 2023-08-18 DIAGNOSIS — D62 Acute posthemorrhagic anemia: Secondary | ICD-10-CM | POA: Diagnosis not present

## 2023-08-18 LAB — BASIC METABOLIC PANEL
Anion gap: 7 (ref 5–15)
BUN: 5 mg/dL — ABNORMAL LOW (ref 8–23)
CO2: 23 mmol/L (ref 22–32)
Calcium: 8.3 mg/dL — ABNORMAL LOW (ref 8.9–10.3)
Chloride: 108 mmol/L (ref 98–111)
Creatinine, Ser: 0.98 mg/dL (ref 0.44–1.00)
GFR, Estimated: 58 mL/min — ABNORMAL LOW (ref >60)
Glucose, Bld: 110 mg/dL — ABNORMAL HIGH (ref 70–99)
Potassium: 3.9 mmol/L (ref 3.5–5.1)
Sodium: 138 mmol/L (ref 135–145)

## 2023-08-18 LAB — CBC
HCT: 25.1 % — ABNORMAL LOW (ref 36.0–46.0)
Hemoglobin: 8 g/dL — ABNORMAL LOW (ref 12.0–15.0)
MCH: 30.8 pg (ref 26.0–34.0)
MCHC: 31.9 g/dL (ref 30.0–36.0)
MCV: 96.5 fL (ref 80.0–100.0)
Platelets: 231 10*3/uL (ref 150–400)
RBC: 2.6 MIL/uL — ABNORMAL LOW (ref 3.87–5.11)
RDW: 15.1 % (ref 11.5–15.5)
WBC: 7.6 10*3/uL (ref 4.0–10.5)
nRBC: 0 % (ref 0.0–0.2)

## 2023-08-18 LAB — GLUCOSE, CAPILLARY
Glucose-Capillary: 110 mg/dL — ABNORMAL HIGH (ref 70–99)
Glucose-Capillary: 117 mg/dL — ABNORMAL HIGH (ref 70–99)

## 2023-08-18 MED ORDER — PANTOPRAZOLE SODIUM 40 MG PO TBEC: 40.0000 mg | DELAYED_RELEASE_TABLET | Freq: Two times a day (BID) | ORAL | 0 refills | Status: AC

## 2023-08-18 NOTE — TOC Transition Note (Signed)
Transition of Care [facility]) - CM/SW Discharge Note   Patient Details  Name: Adriana Anderson MRN: [id number] Date of Birth: Sep 26, 1941  Transition of Care [facility]) CM/SW Contact:  Bing Quarry, RN Phone Number: 08/18/2023, 10:16 AM   Clinical Narrative:  08/18/23: Patient admitted 08/13/23 to ED with complaints of chest pain and some bloody stools intermittently. Paitent has discharge orders to today to home/self care. No HH or DME orders/needs.     PCP: Gracelyn Nurse, MD   Provider Recommendations at discharge:    Follow-up with PCP in 1 week. PCP: Gracelyn Nurse, MD [phone] Phone#.  Follow-up with GI in 1 week. Follow-up with cardiology as scheduled. Check a CBC at next office visit. Restart Eliquis if hemoglobin stable.  No discharge IP TOC needs at this time. To follow with PCP in 1 week and specialist follow up.   Gabriel Cirri MSN RN CM  Care Management Department.  [place]  [facility] Direct Dial: [phone] Main Office Phone: [phone] Weekends Only             Final next level of care: Home/Self Care     Patient Goals and CMS Choice      Discharge Placement                         Discharge Plan and Services Additional resources added to the After Visit Summary for                  DME Arranged: N/A DME Agency: NA       HH Arranged: NA HH Agency: NA        Social Determinants of Health (SDOH) Interventions SDOH Screenings   Food Insecurity: No Food Insecurity (08/14/2023)  Housing: Low Risk  (08/14/2023)  Transportation Needs: No Transportation Needs (08/14/2023)  Utilities: Not At Risk (08/14/2023)  Tobacco Use: Medium Risk (08/13/2023)     Readmission Risk Interventions     No data to display

## 2023-08-18 NOTE — Discharge Summary (Signed)
Physician Discharge Summary   Patient: Adriana Anderson MRN: [id number] DOB: 07/10/1941  Admit date:     08/13/2023  Discharge date: 08/18/23  Discharge Physician: Marrion Coy   PCP: Gracelyn Nurse, MD   Recommendations at discharge:   Follow-up with PCP in 1 week. Follow-up with GI in 1 week. Follow-up with cardiology as scheduled. Check a CBC at next office visit. Restart Eliquis if hemoglobin stable.  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 * Hyponatremia.  Hospital Course: Adriana Anderson is a 82 y.o. female with medical history significant for HTN, DM2, CKD stage III,  A-fib on Eliquis,NSVT,  CAD s/p CABG x 3, bladder cancer in remission, history of melena s/p EGD 05/2023 that showed gastritis but no active bleeding, who presents to the ED with a complaint of exertional chest pain and shortness of breath as well as intermittent melanotic stools for the past few months. Patient hemoglobin was 6.6, received 1 unit PRBC.  Consult from cardiology and GI obtained. 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 Patient was monitored for another day after procedure, hemoglobin still 8.0, stable, medically stable for discharge.  Continue PPI twice a day, follow-up with GI and PCP as outpatient.    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   Hemoglobin since then  has been stable, however, patient continued to have black stools.  EGD is performed on 11/15, showed GAVE, with active bleeding.  Treated with argon laser. Per GI recommendation, patient was monitored overnight after procedure, medically stable for discharge today with a stable hemoglobin.    Stable angina. CAD s/p CABG x 3 Patient has been having chest pain and shortness of breath with exertion for few months.  Consistent with stable angina. Patient has been seen by cardiology, no plan for workup at this time.  Cardiology has signed off, will need to follow-up with cardiology as outpatient. Symptoms probably exacerbated by severe anemia. Follow-up up with cardiology as scheduled.   Paroxysmal atrial fibrillation (HCC) Chronic anticoagulation Continue hold anticoagulation until seen by PCP.   Stage 3a chronic kidney disease (HCC) Renal function at baseline   HTN (hypertension) Resume home home medicines, blood pressure running high.   History of bladder cancer No acute issues suspected   Type 2 diabetes mellitus (HCC) Resume home treatment.            Consultants: GI Procedures performed: EGD  Disposition: Home Diet recommendation:  Discharge Diet Orders (From admission, onward)     Start     Ordered   08/18/23 0000  Diet - low sodium heart healthy        08/18/23 1005           Cardiac diet DISCHARGE MEDICATION: Allergies as of 08/18/2023       Reactions   Tetanus-diphth-acell Pertussis Swelling   Sulfa Antibiotics Rash  Medication List     STOP taking these medications    apixaban 5 MG Tabs tablet Commonly known as: ELIQUIS   famotidine 20 MG tablet Commonly known as: PEPCID   nitrofurantoin (macrocrystal-monohydrate) 100 MG capsule Commonly known as: MACROBID       TAKE these medications    aspirin EC 81 MG tablet Take 81 mg by mouth at bedtime.   atorvastatin 40 MG tablet Commonly known as: LIPITOR Take 1 tablet by mouth  daily.   CALTRATE 600+D PO Take 1 tablet by mouth daily.   estradiol 0.1 MG/GM vaginal cream Commonly known as: ESTRACE APPLY A PEA SIZED AMOUNT TO TIP OF FINGER TO URETHRA BEFORE BED. WASH HANDS WELL AFTER APPLICATION   isosorbide mononitrate 30 MG 24 hr tablet Commonly known as: IMDUR Take 30 mg by mouth daily.   loratadine 10 MG tablet Commonly known as: CLARITIN Take 10 mg by mouth daily as needed for allergies.   metoprolol tartrate 25 MG tablet Commonly known as: LOPRESSOR Take 25 mg by mouth 2 (two) times daily.   nitroGLYCERIN 2.5 MG CR capsule Take 2.5 mg by mouth daily.   OCUVITE PRESERVISION PO Take 1 tablet by mouth daily.   ondansetron 4 MG disintegrating tablet Commonly known as: ZOFRAN-ODT Take 4 mg by mouth every 8 (eight) hours as needed.   pantoprazole 40 MG tablet Commonly known as: PROTONIX Take 1 tablet (40 mg total) by mouth 2 (two) times daily before a meal.   solifenacin 10 MG tablet Commonly known as: VESICARE TAKE 1 TABLET BY MOUTH EVERY DAY        Follow-up Information     Paraschos, Alexander, MD. Go in 1 week(s).   Specialty: Cardiology Contact information: [address] [facility] [place] [address] [phone]         Gracelyn Nurse, MD Follow up in 1 week(s).   Specialty: Internal Medicine Contact information: [address] [facility] [address] [facility] [place] [address] [phone]         Jaynie Collins, DO Follow up in 1 week(s).   Specialty: Gastroenterology Contact information: [address] [facility] [place] [address] [phone]                Discharge Exam: Ceasar Mons Weights   08/13/23 1956  Weight: 61.2 kg   General exam: Appears calm and comfortable  Respiratory system: Clear to auscultation. Respiratory effort normal. Cardiovascular system: S1 & S2 heard, RRR. No JVD, murmurs, rubs, gallops or clicks. No pedal edema. Gastrointestinal  system: Abdomen is nondistended, soft and nontender. No organomegaly or masses felt. Normal bowel sounds heard. Central nervous system: Alert and oriented. No focal neurological deficits. Extremities: Symmetric 5 x 5 power. Skin: No rashes, lesions or ulcers Psychiatry: Judgement and insight appear normal. Mood & affect appropriate.    Condition at discharge: good  The results of significant diagnostics from this hospitalization (including imaging, microbiology, ancillary and laboratory) are listed below for reference.   Imaging Studies: CT ANGIO GI BLEED  Result Date: 08/14/2023 CLINICAL DATA:  Melanotic stools, anemic, noticeable tenderness to palpation in the right lower quadrant and right upper quadrant EXAM: CTA ABDOMEN AND PELVIS WITHOUT AND WITH CONTRAST TECHNIQUE: Multidetector CT imaging of the abdomen and pelvis was performed using the standard protocol during bolus administration of intravenous contrast. Multiplanar reconstructed images and MIPs were obtained and reviewed to evaluate the vascular anatomy. RADIATION DOSE REDUCTION: This exam was performed according to the departmental dose-optimization program  which includes automated exposure control, adjustment of the mA and/or kV according to patient size and/or use of iterative reconstruction technique. CONTRAST:  OMNIPAQUE IOHEXOL 350 MG/ML SOLN COMPARISON:  CT abdomen and pelvis 08/02/2022 FINDINGS: VASCULAR Scattered calcified atherosclerotic plaque without hemodynamically significant stenosis. No aortic aneurysm or dissection. The mesenteric and renal arteries are patent. Patent inflow and outflow arteries bilaterally. Review of the MIP images confirms the above findings. NON-VASCULAR Lower chest: No acute abnormality. Hepatobiliary: Cholecystectomy. No biliary dilation. Unremarkable liver. Pancreas: Unremarkable. Spleen: Unremarkable. Adrenals/Urinary Tract: Unremarkable adrenal glands. Bilateral cortical renal scarring. No  urinary calculi or hydronephrosis. Unremarkable bladder. Stomach/Bowel: Normal caliber large and small bowel. The appendix is not visualized. Stomach is within normal limits. No evidence of active GI bleeding. Lymphatic: No lymphadenopathy. Reproductive: Unremarkable. Other: No free intraperitoneal fluid or air. Musculoskeletal: No acute fracture. IMPRESSION: 1. No evidence of active GI bleeding. 2. No acute abnormality in the abdomen or pelvis. Aortic Atherosclerosis (ICD10-I70.0). Electronically Signed   By: Minerva Fester M.D.   On: 08/14/2023 00:18   DG Chest Portable 1 View  Result Date: 08/13/2023 CLINICAL DATA:  Chest pain EXAM: PORTABLE CHEST 1 VIEW COMPARISON:  Chest x-ray 08/23/2019 FINDINGS: Sternotomy wires are again seen. There is linear scarring or atelectasis in the left lung base. The lungs are otherwise clear. The heart size and mediastinal contours are within normal limits. No pleural effusion or pneumothorax. The visualized skeletal structures are unremarkable. IMPRESSION: Linear scarring or atelectasis in the left lung base. Electronically Signed   By: Darliss Cheney M.D.   On: 08/13/2023 23:25    Microbiology: Results for orders placed or performed in visit on 03/05/23  Microscopic Examination     Status: Abnormal   Collection Time: 03/05/23  3:37 PM   Urine  Result Value Ref Range Status   WBC, UA >30 (A) 0 - 5 /hpf Final   RBC, Urine 0-2 0 - 2 /hpf Final   Epithelial Cells (non renal) 0-10 0 - 10 /hpf Final   Bacteria, UA Moderate (A) None seen/Few Final  CULTURE, URINE COMPREHENSIVE     Status: Abnormal   Collection Time: 03/05/23  4:07 PM   Specimen: Urine   UR  Result Value Ref Range Status   Urine Culture, Comprehensive Final report (A)  Final   Organism ID, Bacteria Comment (A)  Final    Comment: Acinetobacter baumannii Greater than 100,000 colony forming units per mL    ANTIMICROBIAL SUSCEPTIBILITY Comment  Final    Comment:       ** S = Susceptible; I =  Intermediate; R = Resistant **                    P = Positive; N = Negative             MICS are expressed in micrograms per mL    Antibiotic                 RSLT#1    RSLT#2    RSLT#3    RSLT#4 Ampicillin/Sulbactam           S Cefepime                       S Cefotaxime                     S Ceftazidime  S Ceftriaxone                    S Ciprofloxacin                  S Gentamicin                     S Imipenem                       S Meropenem                      S Piperacillin                   S Tetracycline                   R Tobramycin                     S Trimethoprim/Sulfa             R     Labs: CBC: Recent Labs  Lab 08/13/23 2017 08/14/23 0556 08/14/23 0836 08/14/23 1746 08/15/23 0415 08/16/23 0433 08/17/23 0406 08/18/23 0424  WBC 11.2* 8.5  --   --  11.8* 6.7 7.8 7.6  NEUTROABS 8.7*  --   --   --   --   --   --   --   HGB 4.1* 6.6*   < > 7.7* 8.0* 8.0* 8.0* 8.0*  HCT 13.2* 20.4*   < > 23.3* 24.6* 25.8* 25.2* 25.1*  MCV 98.5 94.4  --   --  93.9 97.4 95.1 96.5  PLT 231 221  --   --  218 210 255 231   < > = values in this interval not displayed.   Basic Metabolic Panel: Recent Labs  Lab 08/13/23 2017 08/15/23 0415 08/16/23 0433 08/17/23 0406 08/18/23 0424  NA 133* 137 139 139 138  K 4.4 3.9 4.0 3.8 3.9  CL 106 108 108 108 108  CO2 22 23 23 22 23   GLUCOSE 151* 113* 107* 106* 110*  BUN 28* 15 10 7* 5*  CREATININE 1.15* 1.03* 0.92 0.92 0.98  CALCIUM 8.0* 7.9* 8.4* 8.4* 8.3*  MG  --  2.3 2.3 2.1  --   PHOS  --  2.8 3.1 3.7  --    Liver Function Tests: Recent Labs  Lab 08/13/23 2017  AST 15  ALT 10  ALKPHOS 30*  BILITOT 0.9  PROT 5.3*  ALBUMIN 3.0*   CBG: Recent Labs  Lab 08/17/23 0821 08/17/23 1624 08/17/23 2116 08/18/23 0527 08/18/23 0808  GLUCAP 120* 188* 109* 110* 117*    Discharge time spent: greater than 30 minutes.  Signed: Marrion Coy, MD [facility] 08/18/2023

## 2023-08-20 ENCOUNTER — Encounter: Payer: Self-pay | Admitting: Gastroenterology

## 2023-08-22 ENCOUNTER — Other Ambulatory Visit: Payer: Medicare PPO | Admitting: Urology

## 2023-08-23 ENCOUNTER — Other Ambulatory Visit: Payer: Medicare PPO | Admitting: Urology

## 2023-09-06 ENCOUNTER — Encounter: Payer: Self-pay | Admitting: Urology

## 2023-09-06 ENCOUNTER — Ambulatory Visit: Payer: Medicare PPO | Admitting: Urology

## 2023-09-06 VITALS — BP 138/72 | HR 86 | Ht 65.0 in | Wt 139.0 lb

## 2023-09-06 DIAGNOSIS — Z87440 Personal history of urinary (tract) infections: Secondary | ICD-10-CM

## 2023-09-06 DIAGNOSIS — Z8551 Personal history of malignant neoplasm of bladder: Secondary | ICD-10-CM | POA: Diagnosis not present

## 2023-09-06 DIAGNOSIS — Z08 Encounter for follow-up examination after completed treatment for malignant neoplasm: Secondary | ICD-10-CM

## 2023-09-06 DIAGNOSIS — Z8603 Personal history of neoplasm of uncertain behavior: Secondary | ICD-10-CM

## 2023-09-06 DIAGNOSIS — R399 Unspecified symptoms and signs involving the genitourinary system: Secondary | ICD-10-CM

## 2023-09-06 DIAGNOSIS — N3281 Overactive bladder: Secondary | ICD-10-CM

## 2023-09-06 MED ORDER — GEMTESA 75 MG PO TABS: 75.0000 mg | ORAL_TABLET | Freq: Every day | ORAL | Status: DC

## 2023-09-06 MED ORDER — CIPROFLOXACIN HCL 500 MG PO TABS
500.0000 mg | ORAL_TABLET | Freq: Once | ORAL | Status: AC
  Administered 2023-09-06: 500 mg via ORAL

## 2023-09-06 NOTE — Progress Notes (Signed)
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 Cystoscopy was performed with the a flexible cystoscope. The urethra, bladder neck and entire bladder was visualized in a standard fashion, and mucosa grossly normal. The ureteral orifices were visualized in their normal location and orientation. Retroflexion normal.     CT urogram 08/03/2022 benign CT abdomen and pelvis November 2024 benign   PLAN:  No recurrence RTC 1 year cystoscopy, consider discontinuing screening 2027 per guideline recommendations  Continue estradiol for recurrent UTI/GSM  Worsening OAB symptoms despite Vesicare, samples of Gemtesa given, RTC PA symptom check 4 weeks  Legrand Rams, MD 09/06/2023

## 2023-09-19 IMAGING — US US ABDOMEN COMPLETE
1 series · 14 of 25 positions shown · non-contrast
Comparison: None.

CLINICAL DATA: Chronic nausea. Elevated bilirubin. Previous
cholecystectomy.

EXAM:
ABDOMEN ULTRASOUND COMPLETE

[Series 1: us abdomen complete · 14 of 73 slices shown]
[im 1/73]
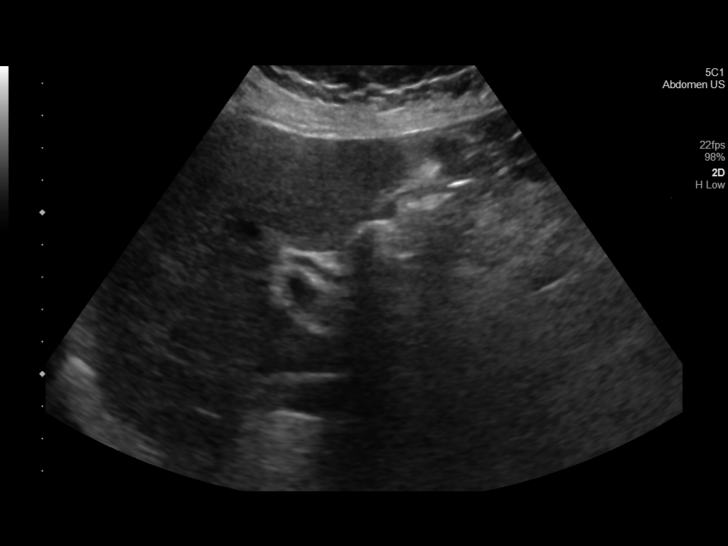
[im 7/73]
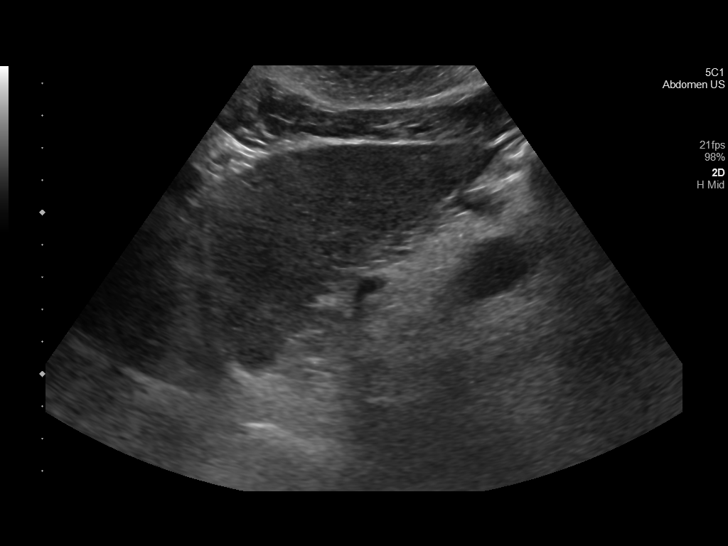
[im 13/73]
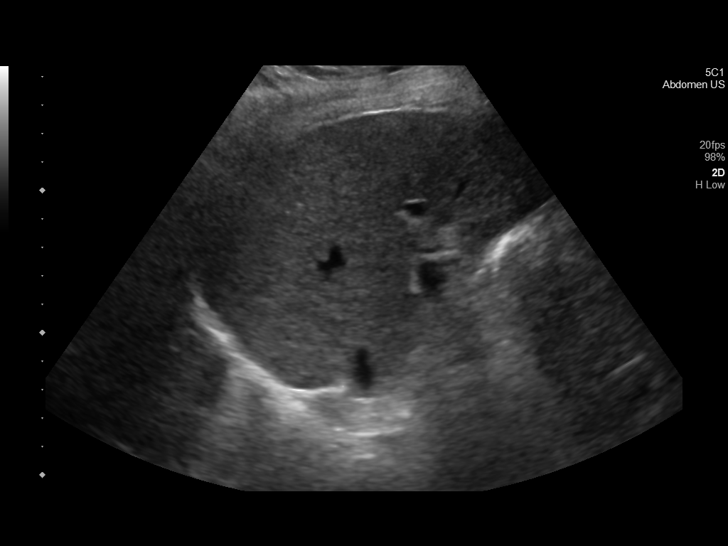
[im 19/73]
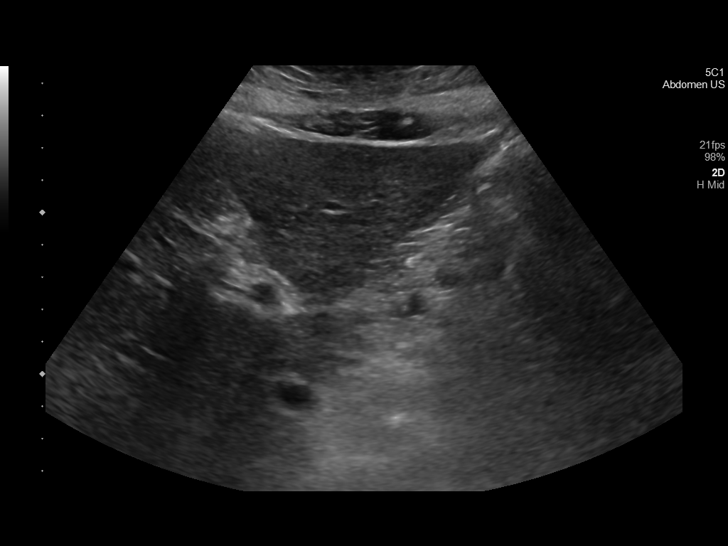
[im 25/73]
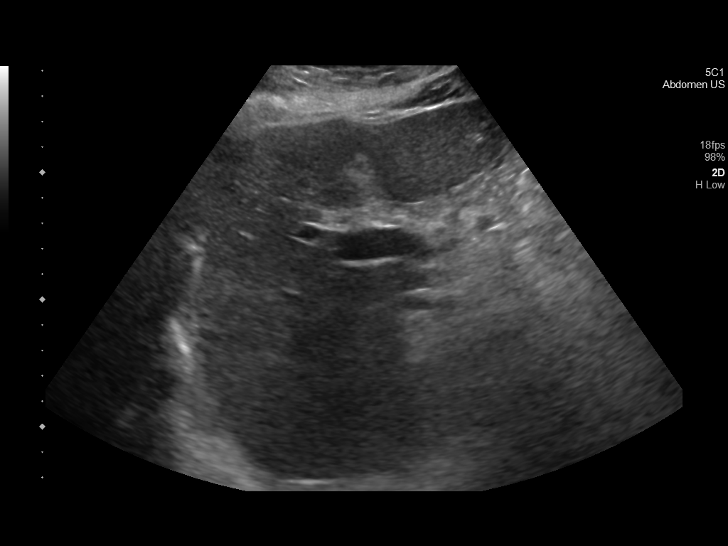
[im 28/73]
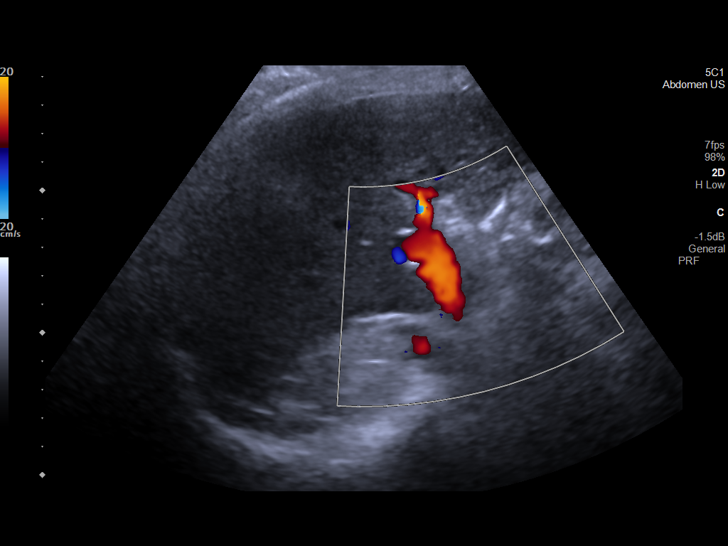
[im 34/73]
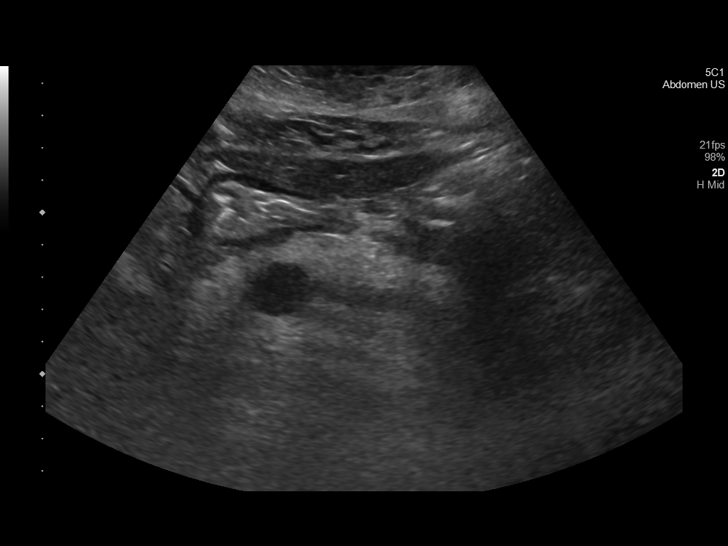
[im 40/73]
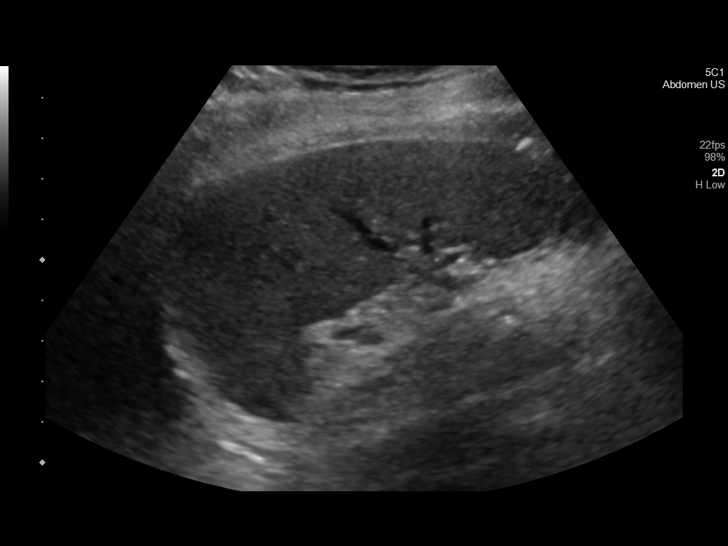
[im 46/73]
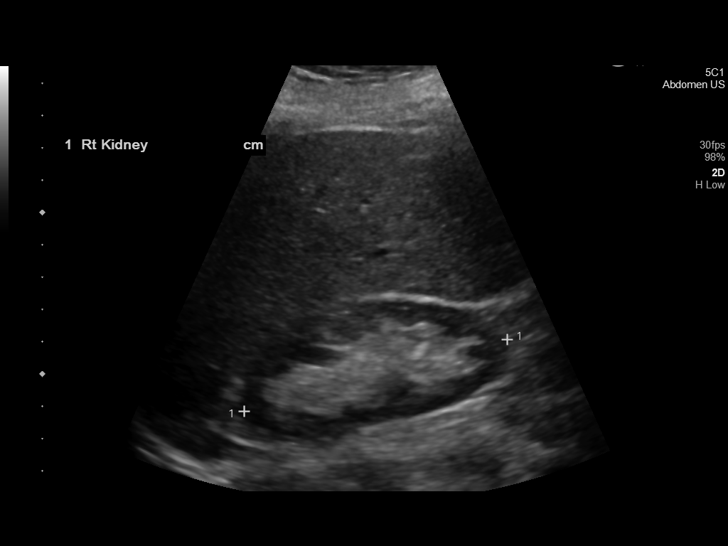
[im 49/73]
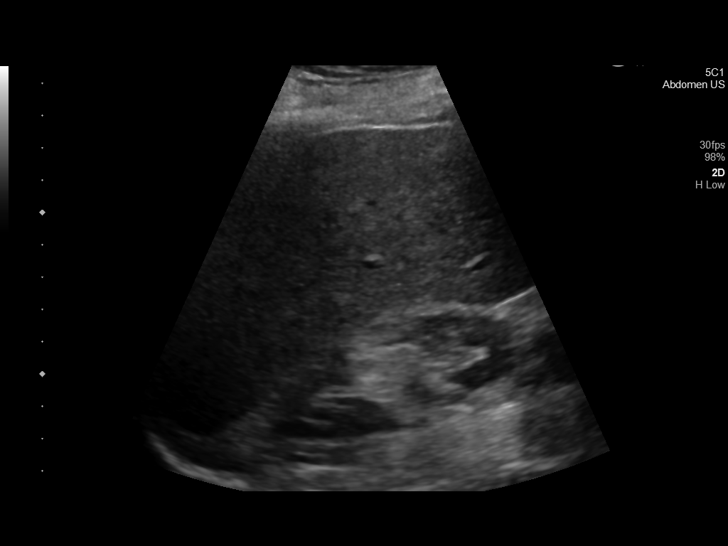
[im 55/73]
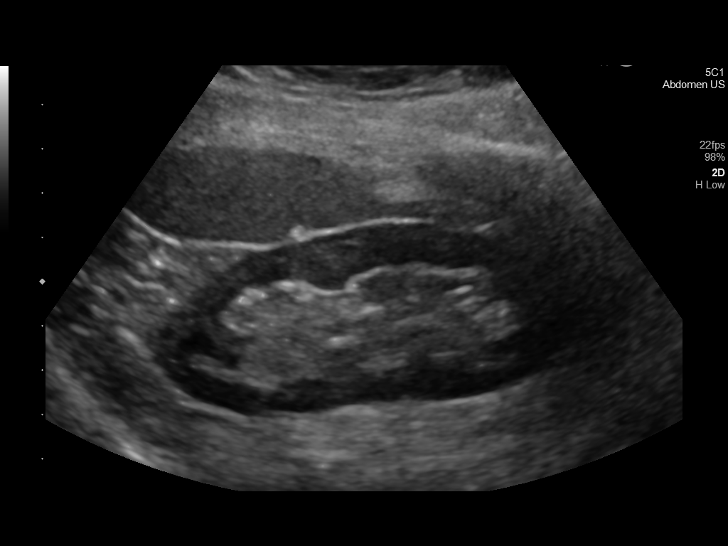
[im 61/73]
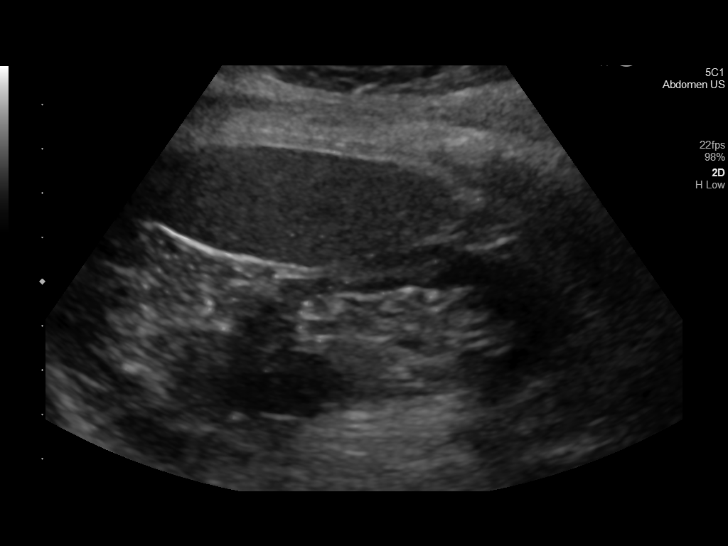
[im 67/73]
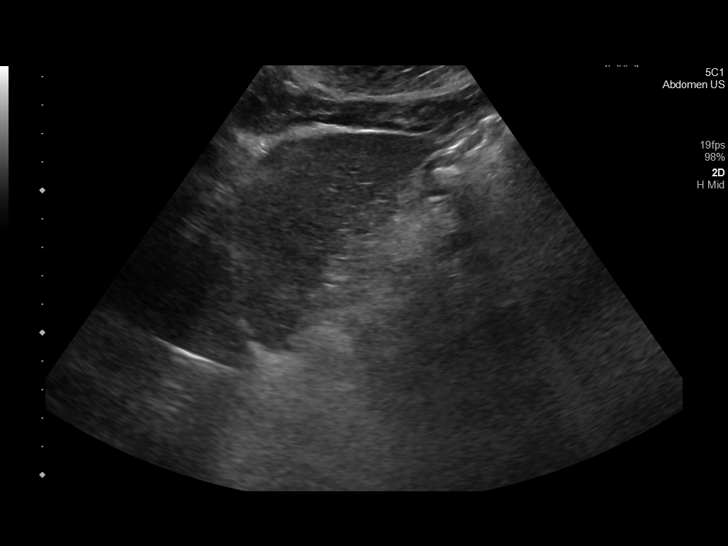
[im 73/73]
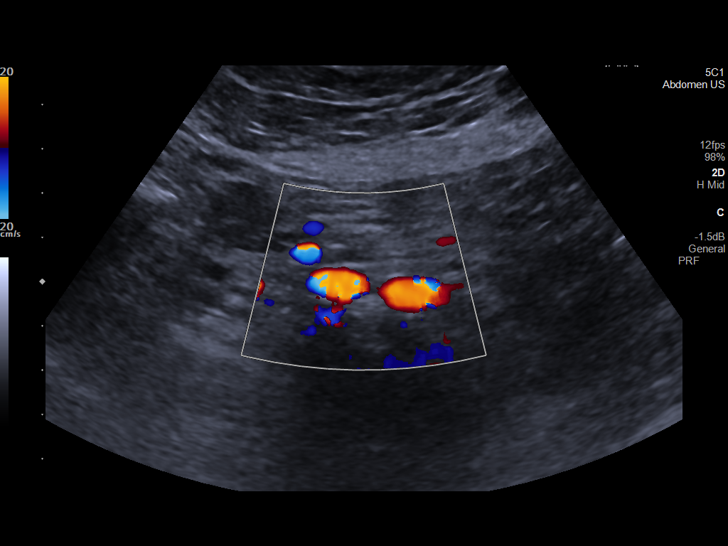

[14 of 25 positions shown; findings below may reference images not displayed]

FINDINGS: Gallbladder: Surgically absent.

Common bile duct: Diameter: 2.5 mm

Liver: No focal lesion identified. Within normal limits in
parenchymal echogenicity. Portal vein is patent on color Doppler
imaging with normal direction of blood flow towards the liver.

IVC: No abnormality visualized.

Pancreas: Visualized portion unremarkable.

Spleen: Size and appearance within normal limits.

Right Kidney: Length: 8.4 cm. Echogenicity within normal limits. No
mass or hydronephrosis visualized.

Left Kidney: Length: 9.3 cm. Echogenicity within normal limits. No
mass or hydronephrosis visualized.

Abdominal aorta: The proximal and mid aorta were obscured by bowel
contents. The distal aorta demonstrates atherosclerosis but no
aneurysm.

Other findings: None.
IMPRESSION: 1. Cholecystectomy.
2. Atherosclerotic change seen in the abdominal aorta.

## 2023-09-27 ENCOUNTER — Other Ambulatory Visit: Payer: Self-pay | Admitting: Urology

## 2023-10-01 ENCOUNTER — Telehealth: Payer: Self-pay | Admitting: Urology

## 2023-10-01 DIAGNOSIS — N3281 Overactive bladder: Secondary | ICD-10-CM

## 2023-10-01 MED ORDER — GEMTESA 75 MG PO TABS: 75.0000 mg | ORAL_TABLET | Freq: Every day | ORAL | 11 refills | Status: DC

## 2023-10-01 NOTE — Telephone Encounter (Signed)
Patient called and stated that she has run out of Gemtesa samples that she was given. She said it is working well, and she would like a prescription sent to [facility] on [address] [facility].

## 2023-10-01 NOTE — Telephone Encounter (Signed)
RX sent

## 2023-10-08 ENCOUNTER — Ambulatory Visit: Payer: Medicare PPO | Admitting: Physician Assistant

## 2023-10-08 VITALS — BP 158/78 | HR 80 | Ht 65.0 in | Wt 133.4 lb

## 2023-10-08 DIAGNOSIS — N3281 Overactive bladder: Secondary | ICD-10-CM

## 2023-10-08 DIAGNOSIS — Z87440 Personal history of urinary (tract) infections: Secondary | ICD-10-CM | POA: Diagnosis not present

## 2023-10-08 DIAGNOSIS — N390 Urinary tract infection, site not specified: Secondary | ICD-10-CM

## 2023-10-08 LAB — BLADDER SCAN AMB NON-IMAGING: Scan Result: 324

## 2023-10-08 MED ORDER — GEMTESA 75 MG PO TABS: 75.0000 mg | ORAL_TABLET | Freq: Every day | ORAL | 3 refills | Status: DC

## 2023-10-08 MED ORDER — NITROFURANTOIN MONOHYD MACRO 100 MG PO CAPS: 100.0000 mg | ORAL_CAPSULE | Freq: Every day | ORAL | 3 refills | Status: DC

## 2023-10-08 NOTE — Progress Notes (Signed)
 10/08/2023 2:11 PM   Adriana Anderson November 30, 1940 [id number]  CC: Chief Complaint  Patient presents with   Over Active Bladder   Recurrent UTI   HPI: Adriana Anderson is a 83 y.o. female with PMH bladder cancer, recurrent UTI on suppressive Macrobid  and estrogen cream, and OAB who presents today for symptom recheck on Gemtesa .  She is accompanied today by her daughter.  Today she reports significant symptomatic improvement on Gemtesa , which she feels is much more helpful than Vesicare .  She has noticed decreased urgency and frequency as well as nocturia, previously x 4-5, now x 1-2.  PVR 324 mL, no prior values available for comparison.  She admits that she tends to double void, but she did not do this today.  PMH: Past Medical History:  Diagnosis Date   A-fib (HCC)    Acid reflux    Anginal pain (HCC)    Bladder cancer (HCC)    Bladder mass    Coronary artery disease    COVID-19 09/11/2019   test done at [facility]   Dyspnea    GERD (gastroesophageal reflux disease)    History of biopsy of bladder    History of cystoscopy    HLD (hyperlipidemia)    Hypertension    Liver disease    Motion sickness    boats   PONV (postoperative nausea and vomiting)    Pre-diabetes     Surgical History: Past Surgical History:  Procedure Laterality Date   APPENDECTOMY     BIOPSY  05/31/2023   Procedure: BIOPSY;  Surgeon: Onita Elspeth Sharper, DO;  Location: [facility] ENDOSCOPY;  Service: Gastroenterology;;   CATARACT EXTRACTION W/PHACO Right 12/10/2019   Procedure: CATARACT EXTRACTION PHACO AND INTRAOCULAR LENS PLACEMENT (IOC) RIGHT 5.43 00:53.3 10.2%;  Surgeon: Mittie Gaskin, MD;  Location: [facility];  Service: Ophthalmology;  Laterality: Right;  has to go day before surgery for covid testing per PAT testing   CHOLECYSTECTOMY     CORONARY ARTERY BYPASS GRAFT  03/07/2017   3 vessel - [facility]   CYSTOSCOPY WITH BIOPSY N/A 11/15/2015   Procedure: CYSTOSCOPY WITH BIOPSY;   Surgeon: Rosina Riis, MD;  Location: [facility] ORS;  Service: Urology;  Laterality: N/A;   ENTEROSCOPY N/A 08/17/2023   Procedure: ENTEROSCOPY;  Surgeon: Therisa Bi, MD;  Location: [facility] ENDOSCOPY;  Service: Gastroenterology;  Laterality: N/A;   ESOPHAGOGASTRODUODENOSCOPY N/A 05/31/2023   Procedure: ESOPHAGOGASTRODUODENOSCOPY (EGD);  Surgeon: Onita Elspeth Sharper, DO;  Location: [facility] ENDOSCOPY;  Service: Gastroenterology;  Laterality: N/A;   INTRAOPERATIVE CHOLANGIOGRAM  08/12/2019   Procedure: INTRAOPERATIVE CHOLANGIOGRAM;  Surgeon: Jordis Laneta FALCON, MD;  Location: [facility] ORS;  Service: General;;   IR PERC CHOLECYSTOSTOMY  05/23/2019   LASER ABLATION CONDOLAMATA  08/17/2023   Procedure: LASER ABLATION CONDOLAMATA;  Surgeon: Therisa Bi, MD;  Location: [facility] ENDOSCOPY;  Service: Gastroenterology;;   LEFT HEART CATH AND CORONARY ANGIOGRAPHY Left 02/23/2017   Procedure: Left Heart Cath and Coronary Angiography;  Surgeon: Ammon Blunt, MD;  Location: [facility] INVASIVE CV LAB;  Service: Cardiovascular;  Laterality: Left;   MALONEY DILATION  05/31/2023   Procedure: AGAPITO DILATION;  Surgeon: Onita Elspeth Sharper, DO;  Location: [facility] ENDOSCOPY;  Service: Gastroenterology;;   TOE SURGERY      Home Medications:  Allergies as of 10/08/2023       Reactions   Tetanus-diphth-acell Pertussis Swelling   Sulfa Antibiotics Rash        Medication List        Accurate as of October 08, 2023  2:11 PM. If you have any questions, ask your nurse or doctor.          aspirin  EC 81 MG tablet Take 81 mg by mouth at bedtime.   atorvastatin  40 MG tablet Commonly known as: LIPITOR Take 1 tablet by mouth daily.   CALTRATE 600+D PO Take 1 tablet by mouth daily.   estradiol  0.1 MG/GM vaginal cream Commonly known as: ESTRACE  APPLY A PEA SIZED AMOUNT TO TIP OF FINGER TO URETHRA BEFORE BED. WASH HANDS WELL AFTER APPLICATION   Gemtesa  75 MG Tabs Generic drug: Vibegron  Take 1 tablet (75 mg total) by mouth  daily.   isosorbide mononitrate 30 MG 24 hr tablet Commonly known as: IMDUR Take 30 mg by mouth daily.   loratadine 10 MG tablet Commonly known as: CLARITIN Take 10 mg by mouth daily as needed for allergies.   metoprolol tartrate 25 MG tablet Commonly known as: LOPRESSOR Take 25 mg by mouth 2 (two) times daily.   nitrofurantoin  (macrocrystal-monohydrate) 100 MG capsule Commonly known as: MACROBID  Take 1 capsule (100 mg total) by mouth daily.   nitroGLYCERIN 2.5 MG CR capsule Take 2.5 mg by mouth daily.   OCUVITE PRESERVISION PO Take 1 tablet by mouth daily.   ondansetron  4 MG disintegrating tablet Commonly known as: ZOFRAN -ODT Take 4 mg by mouth every 8 (eight) hours as needed.   pantoprazole  40 MG tablet Commonly known as: PROTONIX  Take 1 tablet (40 mg total) by mouth 2 (two) times daily before a meal.        Allergies:  Allergies  Allergen Reactions   Tetanus-Diphth-Acell Pertussis Swelling   Sulfa Antibiotics Rash    Family History: Family History  Problem Relation Age of Onset   Bladder Cancer Brother        x 3    Brain cancer Father    Kidney disease Father    Diabetes Other    Heart attack Mother    Heart failure Sister    Bladder Cancer Brother    Prostate cancer Neg Hx    Kidney cancer Neg Hx     Social History:   reports that she quit smoking about 41 years ago. Her smoking use included cigarettes. She has been exposed to tobacco smoke. She has never used smokeless tobacco. She reports current alcohol use. She reports that she does not use drugs.  Physical Exam: BP (!) 158/78   Pulse 80   Ht 5' 5 (1.651 m)   Wt 133 lb 6 oz (60.5 kg)   BMI 22.19 kg/m   Constitutional:  Alert and oriented, no acute distress, nontoxic appearing HEENT: [place], AT Cardiovascular: No clubbing, cyanosis, or edema Respiratory: Normal respiratory effort, no increased work of breathing Skin: No rashes, bruises or suspicious lesions Neurologic: Grossly intact, no  focal deficits, moving all 4 extremities Psychiatric: Normal mood and affect  Laboratory Data: Results for orders placed or performed in visit on 10/08/23  Bladder Scan (Post Void Residual) in office   Collection Time: 10/08/23  9:48 AM  Result Value Ref Range   Scan Result 324 ml    Assessment & Plan:   1. OAB (overactive bladder) (Primary) Significant improvement in urgency, frequency, and nocturia on Gemtesa .  PVR is elevated, however unclear if this is secondary to Gemtesa  or if she incompletely empties at baseline.  Given that she is asymptomatic of her incomplete emptying, we discussed continuing pharmacotherapy and just continuing to monitor her bladder scans over time. - Bladder Scan (Post  Void Residual) in office - Vibegron  (GEMTESA ) 75 MG TABS; Take 1 tablet (75 mg total) by mouth daily.  Dispense: 90 tablet; Refill: 3  2. Recurrent UTI Will continue suppressive Macrobid .  If she starts having frequent breakthrough infections on Gemtesa , this is likely due to incomplete bladder emptying and we will need to reconsider that pharmacotherapy. - nitrofurantoin , macrocrystal-monohydrate, (MACROBID ) 100 MG capsule; Take 1 capsule (100 mg total) by mouth daily.  Dispense: 90 capsule; Refill: 3  Return in about 1 year (around 10/07/2024) for Cystoscopy with Dr. Francisca.  Lucie Hones, PA-C  [facility] [place] [address], [address] [place], [place] [address] [phone]

## 2023-10-11 ENCOUNTER — Other Ambulatory Visit: Payer: Self-pay

## 2023-10-11 ENCOUNTER — Ambulatory Visit
Admission: RE | Admit: 2023-10-11 | Discharge: 2023-10-11 | Disposition: A | Discharge status: Home / Self Care | Payer: Medicare PPO | Attending: Gastroenterology | Admitting: Gastroenterology

## 2023-10-11 ENCOUNTER — Ambulatory Visit: Payer: Medicare PPO | Admitting: Certified Registered"

## 2023-10-11 ENCOUNTER — Encounter: Payer: Self-pay | Admitting: Gastroenterology

## 2023-10-11 ENCOUNTER — Encounter
Admission: RE | Disposition: A | Discharge status: Home / Self Care | Payer: Self-pay | Source: Home / Self Care | Attending: Gastroenterology

## 2023-10-11 DIAGNOSIS — Z8551 Personal history of malignant neoplasm of bladder: Secondary | ICD-10-CM | POA: Diagnosis not present

## 2023-10-11 DIAGNOSIS — K640 First degree hemorrhoids: Secondary | ICD-10-CM | POA: Diagnosis not present

## 2023-10-11 DIAGNOSIS — K31811 Angiodysplasia of stomach and duodenum with bleeding: Secondary | ICD-10-CM | POA: Diagnosis not present

## 2023-10-11 DIAGNOSIS — E119 Type 2 diabetes mellitus without complications: Secondary | ICD-10-CM | POA: Diagnosis not present

## 2023-10-11 DIAGNOSIS — K219 Gastro-esophageal reflux disease without esophagitis: Secondary | ICD-10-CM | POA: Insufficient documentation

## 2023-10-11 DIAGNOSIS — I2510 Atherosclerotic heart disease of native coronary artery without angina pectoris: Secondary | ICD-10-CM | POA: Diagnosis not present

## 2023-10-11 DIAGNOSIS — Z951 Presence of aortocoronary bypass graft: Secondary | ICD-10-CM | POA: Insufficient documentation

## 2023-10-11 DIAGNOSIS — I10 Essential (primary) hypertension: Secondary | ICD-10-CM | POA: Diagnosis not present

## 2023-10-11 DIAGNOSIS — I4891 Unspecified atrial fibrillation: Secondary | ICD-10-CM | POA: Insufficient documentation

## 2023-10-11 DIAGNOSIS — Z87891 Personal history of nicotine dependence: Secondary | ICD-10-CM | POA: Diagnosis not present

## 2023-10-11 DIAGNOSIS — D120 Benign neoplasm of cecum: Secondary | ICD-10-CM | POA: Insufficient documentation

## 2023-10-11 DIAGNOSIS — D509 Iron deficiency anemia, unspecified: Secondary | ICD-10-CM | POA: Diagnosis present

## 2023-10-11 HISTORY — PX: ESOPHAGOGASTRODUODENOSCOPY (EGD) WITH PROPOFOL: SHX5813

## 2023-10-11 HISTORY — PX: POLYPECTOMY: SHX5525

## 2023-10-11 HISTORY — PX: HOT HEMOSTASIS: SHX5433

## 2023-10-11 HISTORY — PX: COLONOSCOPY WITH PROPOFOL: SHX5780

## 2023-10-11 SURGERY — COLONOSCOPY WITH PROPOFOL
Anesthesia: General

## 2023-10-11 MED ORDER — PROPOFOL 500 MG/50ML IV EMUL
INTRAVENOUS | Status: DC | PRN
  Administered 2023-10-11: 145 ug/kg/min via INTRAVENOUS

## 2023-10-11 MED ORDER — SODIUM CHLORIDE 0.9 % IV SOLN: INTRAVENOUS | Status: DC

## 2023-10-11 MED ORDER — GLYCOPYRROLATE 0.2 MG/ML IJ SOLN
INTRAMUSCULAR | Status: DC | PRN
  Administered 2023-10-11: .2 mg via INTRAVENOUS

## 2023-10-11 MED ORDER — PROPOFOL 10 MG/ML IV BOLUS
INTRAVENOUS | Status: DC | PRN
  Administered 2023-10-11 (×3): 10 mg via INTRAVENOUS
  Administered 2023-10-11: 40 mg via INTRAVENOUS

## 2023-10-11 MED ORDER — LIDOCAINE HCL (CARDIAC) PF 100 MG/5ML IV SOSY
PREFILLED_SYRINGE | INTRAVENOUS | Status: DC | PRN
  Administered 2023-10-11: 80 mg via INTRAVENOUS

## 2023-10-11 NOTE — Anesthesia Postprocedure Evaluation (Signed)
 Anesthesia Post Note  Patient: Adriana Anderson  Procedure(s) Performed: COLONOSCOPY WITH PROPOFOL  ESOPHAGOGASTRODUODENOSCOPY (EGD) WITH PROPOFOL   Patient location during evaluation: Endoscopy Anesthesia Type: General Level of consciousness: awake and alert Pain management: pain level controlled Vital Signs Assessment: post-procedure vital signs reviewed and stable Respiratory status: spontaneous breathing, nonlabored ventilation, respiratory function stable and patient connected to nasal cannula oxygen Cardiovascular status: blood pressure returned to baseline and stable Postop Assessment: no apparent nausea or vomiting Anesthetic complications: no   No notable events documented.   Last Vitals:  Vitals:   10/11/23 1016 10/11/23 1026  BP: (!) 127/50 (!) 146/67  Pulse: 78 87  Resp: 13 18  Temp: (!) 35.6 C   SpO2: 100% 100%    Last Pain:  Vitals:   10/11/23 1026  TempSrc:   PainSc: 0-No pain                 Fairy POUR [place]

## 2023-10-11 NOTE — H&P (Signed)
 Pre-Procedure H&P   Patient ID: Adriana Anderson is a 83 y.o. female.  Gastroenterology Provider: Elspeth Ozell Jungling, DO  Referring Provider: Luke Barefoot PCP: Rudolpho Norleen JONETTA, MD  Date: 10/11/2023  HPI Adriana Anderson is a 83 y.o. female who presents today for Esophagogastroduodenoscopy and Colonoscopy for GAVE, iron deficiency anemia .  Pt suffered GIB in 08/2023 and underwent endoscopy revealing bleeding gave- underwent apc with resolution. Hgb is now 10.6.  No longer on Eliquis   Remote history of colonoscopy.  Previously declined undergoing repeat colonoscopy  No family history of colon cancer or colon polyps   Past Medical History:  Diagnosis Date   A-fib (HCC)    Acid reflux    Anginal pain (HCC)    Bladder cancer (HCC)    Bladder mass    Coronary artery disease    COVID-19 09/11/2019   test done at [facility]   Dyspnea    GERD (gastroesophageal reflux disease)    History of biopsy of bladder    History of cystoscopy    HLD (hyperlipidemia)    Hypertension    Liver disease    Motion sickness    boats   PONV (postoperative nausea and vomiting)    Pre-diabetes     Past Surgical History:  Procedure Laterality Date   APPENDECTOMY     BIOPSY  05/31/2023   Procedure: BIOPSY;  Surgeon: Jungling Elspeth Ozell, DO;  Location: [facility] ENDOSCOPY;  Service: Gastroenterology;;   CATARACT EXTRACTION W/PHACO Right 12/10/2019   Procedure: CATARACT EXTRACTION PHACO AND INTRAOCULAR LENS PLACEMENT (IOC) RIGHT 5.43 00:53.3 10.2%;  Surgeon: Mittie Gaskin, MD;  Location: [facility];  Service: Ophthalmology;  Laterality: Right;  has to go day before surgery for covid testing per PAT testing   CHOLECYSTECTOMY     CORONARY ARTERY BYPASS GRAFT  03/07/2017   3 vessel - [facility]   CYSTOSCOPY WITH BIOPSY N/A 11/15/2015   Procedure: CYSTOSCOPY WITH BIOPSY;  Surgeon: Rosina Riis, MD;  Location: [facility] ORS;  Service: Urology;  Laterality: N/A;   ENTEROSCOPY N/A  08/17/2023   Procedure: ENTEROSCOPY;  Surgeon: Therisa Bi, MD;  Location: [facility] END[facility]COPY;  Service: Gastroenterology;  Laterality: N/A;   ESOPHAGOGASTRODUODENOSCOPY N/A 05/31/2023   Procedure: ESOPHAGOGASTRODUODENOSCOPY (EGD);  Surgeon: Jungling Elspeth Ozell, DO;  Location: [facility] ENDOSCOPY;  Service: Gastroenterology;  Laterality: N/A;   INTRAOPERATIVE CHOLANGIOGRAM  08/12/2019   Procedure: INTRAOPERATIVE CHOLANGIOGRAM;  Surgeon: Jordis Laneta FALCON, MD;  Location: [facility] ORS;  Service: General;;   IR PERC CHOLECYSTOSTOMY  05/23/2019   LASER ABLATION CONDOLAMATA  08/17/2023   Procedure: LASER ABLATION CONDOLAMATA;  Surgeon: Therisa Bi, MD;  Location: [facility] ENDOSCOPY;  Service: Gastroenterology;;   LEFT HEART CATH AND CORONARY ANGIOGRAPHY Left 02/23/2017   Procedure: Left Heart Cath and Coronary Angiography;  Surgeon: Ammon Blunt, MD;  Location: [facility] INVASIVE CV LAB;  Service: Cardiovascular;  Laterality: Left;   MALONEY DILATION  05/31/2023   Procedure: AGAPITO DILATION;  Surgeon: Jungling Elspeth Ozell, DO;  Location: [facility] ENDOSCOPY;  Service: Gastroenterology;;   TOE SURGERY      Family History No h/o GI disease or malignancy  Review of Systems  Constitutional:  Negative for activity change, appetite change, chills, diaphoresis, fatigue, fever and unexpected weight change.  HENT:  Negative for trouble swallowing and voice change.   Respiratory:  Negative for shortness of breath and wheezing.   Cardiovascular:  Negative for chest pain, palpitations and leg swelling.  Gastrointestinal:  Negative for abdominal distention, abdominal pain, anal bleeding,  blood in stool, constipation, diarrhea, nausea, rectal pain and vomiting.  Musculoskeletal:  Negative for arthralgias and myalgias.  Skin:  Negative for color change and pallor.  Neurological:  Negative for dizziness, syncope and weakness.  Psychiatric/Behavioral:  Negative for confusion.   All other systems reviewed and are negative.     Medications No current facility-administered medications on file prior to encounter.   Current Outpatient Medications on File Prior to Encounter  Medication Sig Dispense Refill   Calcium  Carbonate-Vitamin D  (CALTRATE 600+D PO) Take 1 tablet by mouth daily.      loratadine (CLARITIN) 10 MG tablet Take 10 mg by mouth daily as needed for allergies.     metoprolol tartrate (LOPRESSOR) 25 MG tablet Take 25 mg by mouth 2 (two) times daily.     Multiple Vitamins-Minerals (OCUVITE PRESERVISION PO) Take 1 tablet by mouth daily.     pantoprazole  (PROTONIX ) 40 MG tablet Take 1 tablet (40 mg total) by mouth 2 (two) times daily before a meal. 60 tablet 0   aspirin  EC 81 MG tablet Take 81 mg by mouth at bedtime.     atorvastatin  (LIPITOR) 40 MG tablet Take 1 tablet by mouth daily.     estradiol  (ESTRACE ) 0.1 MG/GM vaginal cream APPLY A PEA SIZED AMOUNT TO TIP OF FINGER TO URETHRA BEFORE BED. WASH HANDS WELL AFTER APPLICATION 42.5 g 3   isosorbide mononitrate (IMDUR) 30 MG 24 hr tablet Take 30 mg by mouth daily.      nitroGLYCERIN 2.5 MG CR capsule Take 2.5 mg by mouth daily.      ondansetron  (ZOFRAN -ODT) 4 MG disintegrating tablet Take 4 mg by mouth every 8 (eight) hours as needed.      Pertinent medications related to GI and procedure were reviewed by me with the patient prior to the procedure   Current Facility-Administered Medications:    0.9 %  sodium chloride  infusion, , Intravenous, Continuous, Onita Elspeth Sharper, DO, Last Rate: 20 mL/hr at 10/11/23 0901, New Bag at 10/11/23 0901  sodium chloride  20 mL/hr at 10/11/23 0901       Allergies  Allergen Reactions   Tetanus-Diphth-Acell Pertussis Swelling   Sulfa Antibiotics Rash   Allergies were reviewed by me prior to the procedure  Objective   Body mass index is 21.63 kg/m. Vitals:   10/11/23 0851  BP: (!) 153/57  Pulse: 70  Resp: 18  Temp: (!) 96.2 F (35.7 C)  TempSrc: Temporal  SpO2: 100%  Weight: 59 kg  Height: 5' 5  (1.651 m)     Physical Exam Vitals and nursing note reviewed.  Constitutional:      General: She is not in acute distress.    Appearance: Normal appearance. She is not ill-appearing, toxic-appearing or diaphoretic.  HENT:     Head: Normocephalic and atraumatic.     Nose: Nose normal.     Mouth/Throat:     Mouth: Mucous membranes are moist.     Pharynx: Oropharynx is clear.  Eyes:     General: No scleral icterus.    Extraocular Movements: Extraocular movements intact.  Cardiovascular:     Rate and Rhythm: Normal rate and regular rhythm.     Heart sounds: Normal heart sounds. No murmur heard.    No friction rub. No gallop.  Pulmonary:     Effort: Pulmonary effort is normal. No respiratory distress.     Breath sounds: Normal breath sounds. No wheezing, rhonchi or rales.  Abdominal:     General: Abdomen is flat.  Bowel sounds are normal. There is no distension.     Palpations: Abdomen is soft.     Tenderness: There is no abdominal tenderness. There is no guarding or rebound.  Musculoskeletal:     Cervical back: Neck supple.     Right lower leg: No edema.     Left lower leg: No edema.  Skin:    General: Skin is warm and dry.     Coloration: Skin is not jaundiced or pale.  Neurological:     General: No focal deficit present.     Mental Status: She is alert and oriented to person, place, and time. Mental status is at baseline.  Psychiatric:        Mood and Affect: Mood normal.        Behavior: Behavior normal.        Thought Content: Thought content normal.        Judgment: Judgment normal.      Assessment:  Ms. LAURALEE WATERS is a 83 y.o. female  who presents today for Esophagogastroduodenoscopy and Colonoscopy for gave, ida.  Plan:  Esophagogastroduodenoscopy and Colonoscopy with possible intervention today  Esophagogastroduodenoscopy and Colonoscopy with possible biopsy, control of bleeding, polypectomy, and interventions as necessary has been discussed with the  patient/patient representative. Informed consent was obtained from the patient/patient representative after explaining the indication, nature, and risks of the procedure including but not limited to death, bleeding, perforation, missed neoplasm/lesions, cardiorespiratory compromise, and reaction to medications. Opportunity for questions was given and appropriate answers were provided. Patient/patient representative has verbalized understanding is amenable to undergoing the procedure.   Elspeth Ozell Jungling, DO  [facility] Gastroenterology  Portions of the record may have been created with voice recognition software. Occasional wrong-word or 'sound-a-like' substitutions may have occurred due to the inherent limitations of voice recognition software.  Read the chart carefully and recognize, using context, where substitutions may have occurred.

## 2023-10-11 NOTE — Transfer of Care (Addendum)
 Immediate Anesthesia Transfer of Care Note  Patient: Adriana Anderson  Procedure(s) Performed: COLONOSCOPY WITH PROPOFOL  ESOPHAGOGASTRODUODENOSCOPY (EGD) WITH PROPOFOL   Patient Location: Nursing Unit  Anesthesia Type:General  Level of Consciousness: awake  Airway & Oxygen Therapy: Patient Spontanous Breathing and Patient connected to nasal cannula oxygen  Post-op Assessment: Report given to RN and Post -op Vital signs reviewed and stable  Post vital signs: Reviewed and stable  Last Vitals:  Vitals Value Taken Time  BP 146/67 10/11/23 1026  Temp 35.6 C 10/11/23 1016  Pulse 87 10/11/23 1026  Resp 18 10/11/23 1026  SpO2 100 % 10/11/23 1026    Last Pain:  Vitals:   10/11/23 1026  TempSrc:   PainSc: 0-No pain         Complications: No notable events documented.

## 2023-10-11 NOTE — Op Note (Signed)
 [facility] Gastroenterology Patient Name: Adriana Anderson Procedure Date: 10/11/2023 9:40 AM MRN: [id number] Account #: [id number] Date of Birth: [birth year] Admit Type: Outpatient Age: 83 Room: [facility] ENDO ROOM 1 Gender: Female Note Status: Finalized Instrument Name: Donnita [id number] Procedure:             Upper GI endoscopy Indications:           Iron deficiency anemia Providers:             Elspeth Ozell Onita ROSALEA, DO Referring MD:          Elspeth Ozell Onita DO, DO (Referring MD), Norleen RONAL Louder, MD (Referring MD) Medicines:             Monitored Anesthesia Care Complications:         No immediate complications. Estimated blood loss:                         Minimal. Procedure:             Pre-Anesthesia Assessment:                        - Prior to the procedure, a History and Physical was                         performed, and patient medications and allergies were                         reviewed. The patient is competent. The risks and                         benefits of the procedure and the sedation options and                         risks were discussed with the patient. All questions                         were answered and informed consent was obtained.                         Patient identification and proposed procedure were                         verified by the physician, the anesthetist and the                         technician in the endoscopy suite. Mental Status                         Examination: alert and oriented. Airway Examination:                         normal oropharyngeal airway and neck mobility.                         Respiratory Examination: clear to auscultation. CV  Examination: RRR, no murmurs, no S3 or S4.                         Prophylactic Antibiotics: The patient does not require                         prophylactic antibiotics. Prior Anticoagulants: The                          patient has taken no anticoagulant or antiplatelet                         agents. ASA Grade Assessment: III - A patient with                         severe systemic disease. After reviewing the risks and                         benefits, the patient was deemed in satisfactory                         condition to undergo the procedure. The anesthesia                         plan was to use monitored anesthesia care (MAC).                         Immediately prior to administration of medications,                         the patient was re-assessed for adequacy to receive                         sedatives. The heart rate, respiratory rate, oxygen                         saturations, blood pressure, adequacy of pulmonary                         ventilation, and response to care were monitored                         throughout the procedure. The physical status of the                         patient was re-assessed after the procedure.                        After obtaining informed consent, the endoscope was                         passed under direct vision. Throughout the procedure,                         the patient's blood pressure, pulse, and oxygen                         saturations were monitored continuously. The Endoscope  was introduced through the mouth, and advanced to the                         second part of duodenum. The upper GI endoscopy was                         accomplished without difficulty. The patient tolerated                         the procedure well. Findings:      The duodenal bulb, first portion of the duodenum and second portion of       the duodenum were normal. Estimated blood loss: none.      The Z-line was regular. Estimated blood loss: none.      Esophagogastric landmarks were identified: the gastroesophageal junction       was found at 40 cm from the incisors.      The exam of the esophagus was otherwise normal.       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 Coagulation for bleeding prevention using argon       plasma was successful. Estimated blood loss was minimal.      The exam of the stomach was otherwise normal. Impression:            - Normal duodenal bulb, first portion of the duodenum                         and second portion of the duodenum.                        - Z-line regular.                        - Esophagogastric landmarks identified.                        - Gastric antral vascular ectasia with bleeding.                         Treated with argon plasma coagulation (APC).                        - No specimens collected. Recommendation:        - Patient has a contact number available for                         emergencies. The signs and symptoms of potential                         delayed complications were discussed with the patient.                         Return to normal activities tomorrow. Written                         discharge instructions were provided to the patient.                        - Discharge patient to home.                        -  Resume previous diet.                        - Continue present medications.                        - Repeat upper endoscopy PRN for retreatment.                        - Return to GI office as previously scheduled.                        - The findings and recommendations were discussed with                         the patient. Procedure Code(s):     --- Professional ---                        [phone], Esophagogastroduodenoscopy, flexible,                         transoral; with control of bleeding, any method Diagnosis Code(s):     --- Professional ---                        K31.811, Angiodysplasia of stomach and duodenum with                         bleeding                        D50.9, Iron deficiency anemia, unspecified CPT copyright 2022 American Medical Association. All  rights reserved. The codes documented in this report are preliminary and upon coder review may  be revised to meet current compliance requirements. Attending Participation:      I personally performed the entire procedure. Elspeth Jungling, DO Elspeth Ozell Jungling DO, DO 10/11/2023 9:53:05 AM This report has been signed electronically. Number of Addenda: 0 Note Initiated On: 10/11/2023 9:40 AM Estimated Blood Loss:  Estimated blood loss was minimal.      [facility]

## 2023-10-11 NOTE — Op Note (Signed)
 [facility] Gastroenterology Patient Name: Adriana Anderson Procedure Date: 10/11/2023 9:39 AM MRN: [id number] Account #: [id number] Date of Birth: 1941-01-28 Admit Type: Outpatient Age: 83 Room: [facility] ENDO ROOM 1 Gender: Female Note Status: Finalized Instrument Name: Peds Colonoscope [id number] Procedure:             Colonoscopy Indications:           Iron deficiency anemia Providers:             Elspeth Ozell Onita ROSALEA, DO Referring MD:          Elspeth Ozell Onita DO, DO (Referring MD), Lynwood MICAEL Rush, MD (Referring MD) Medicines:             Monitored Anesthesia Care Complications:         No immediate complications. Estimated blood loss:                         Minimal. Procedure:             Pre-Anesthesia Assessment:                        - Prior to the procedure, a History and Physical was                         performed, and patient medications and allergies were                         reviewed. The patient is competent. The risks and                         benefits of the procedure and the sedation options and                         risks were discussed with the patient. All questions                         were answered and informed consent was obtained.                         Patient identification and proposed procedure were                         verified by the physician, the nurse, the anesthetist                         and the technician in the endoscopy suite. Mental                         Status Examination: alert and oriented. Airway                         Examination: normal oropharyngeal airway and neck                         mobility. Respiratory Examination: clear to  auscultation. CV Examination: RRR, no murmurs, no S3                         or S4. Prophylactic Antibiotics: The patient does not                         require prophylactic antibiotics. Prior                          Anticoagulants: The patient has taken no anticoagulant                         or antiplatelet agents. ASA Grade Assessment: III - A                         patient with severe systemic disease. After reviewing                         the risks and benefits, the patient was deemed in                         satisfactory condition to undergo the procedure. The                         anesthesia plan was to use monitored anesthesia care                         (MAC). Immediately prior to administration of                         medications, the patient was re-assessed for adequacy                         to receive sedatives. The heart rate, respiratory                         rate, oxygen saturations, blood pressure, adequacy of                         pulmonary ventilation, and response to care were                         monitored throughout the procedure. The physical                         status of the patient was re-assessed after the                         procedure.                        After obtaining informed consent, the colonoscope was                         passed under direct vision. Throughout the procedure,                         the patient's blood pressure, pulse, and oxygen  saturations were monitored continuously. The                         Colonoscope was introduced through the anus and                         advanced to the the cecum, identified by appendiceal                         orifice and ileocecal valve. The colonoscopy was                         performed without difficulty. The patient tolerated                         the procedure well. The quality of the bowel                         preparation was evaluated using the BBPS [facility] Bowel                         Preparation Scale) with scores of: Right Colon = 3,                         Transverse Colon = 3 and Left Colon = 3 (entire mucosa                         seen well  with no residual staining, small fragments                         of stool or opaque liquid). The total BBPS score                         equals 9. The ileocecal valve, appendiceal orifice,                         and rectum were photographed. Findings:      Hemorrhoids were found on perianal exam.      The digital rectal exam findings include decreased sphincter tone.      A 1 mm polyp was found in the cecum. The polyp was sessile. The polyp       was removed with a jumbo cold forceps. Resection and retrieval were       complete. Estimated blood loss was minimal.      An 8 to 9 mm polyp was found in the cecum. The polyp was sessile. The       polyp was removed with a cold snare. Resection and retrieval were       complete. Estimated blood loss was minimal.      Non-bleeding internal hemorrhoids were found during retroflexion. The       hemorrhoids were Grade I (internal hemorrhoids that do not prolapse).      The exam was otherwise without abnormality on direct and retroflexion       views.      Retroflexion in the right colon was performed. Impression:            - Hemorrhoids found on perianal exam.                        -  Decreased sphincter tone found on digital rectal                         exam.                        - One 1 mm polyp in the cecum, removed with a jumbo                         cold forceps. Resected and retrieved.                        - One 8 to 9 mm polyp in the cecum, removed with a                         cold snare. Resected and retrieved.                        - Non-bleeding internal hemorrhoids.                        - The examination was otherwise normal on direct and                         retroflexion views. Recommendation:        - Patient has a contact number available for                         emergencies. The signs and symptoms of potential                         delayed complications were discussed with the patient.                          Return to normal activities tomorrow. Written                         discharge instructions were provided to the patient.                        - Discharge patient to home.                        - Resume previous diet.                        - Continue present medications.                        - No ibuprofen, naproxen, or other non-steroidal                         anti-inflammatory drugs for 5 days after polyp removal.                        - Await pathology results.                        - Repeat colonoscopy for surveillance based on  pathology results.                        - Return to GI office as previously scheduled.                        - The findings and recommendations were discussed with                         the patient. Procedure Code(s):     --- Professional ---                        [phone], Colonoscopy, flexible; with removal of                         tumor(s), polyp(s), or other lesion(s) by snare                         technique                        [id number], 59, Colonoscopy, flexible; with biopsy, single                         or multiple Diagnosis Code(s):     --- Professional ---                        K62.89, Other specified diseases of anus and rectum                        D12.0, Benign neoplasm of cecum                        K64.0, First degree hemorrhoids                        D50.9, Iron deficiency anemia, unspecified CPT copyright 2022 American Medical Association. All rights reserved. The codes documented in this report are preliminary and upon coder review may  be revised to meet current compliance requirements. Attending Participation:      I personally performed the entire procedure. Elspeth Jungling, DO Elspeth Ozell Jungling DO, DO 10/11/2023 10:18:42 AM This report has been signed electronically. Number of Addenda: 0 Note Initiated On: 10/11/2023 9:39 AM Scope Withdrawal Time: 0 hours 11 minutes 36 seconds  Total  Procedure Duration: 0 hours 17 minutes 46 seconds  Estimated Blood Loss:  Estimated blood loss was minimal.      [facility]

## 2023-10-11 NOTE — Interval H&P Note (Signed)
 History and Physical Interval Note: Preprocedure H&P from 10/11/23  was reviewed and there was no interval change after seeing and examining the patient.  Written consent was obtained from the patient after discussion of risks, benefits, and alternatives. 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  The various methods of treatment have been discussed with the patient and family. After consideration of risks, benefits and other options for treatment, the patient has consented to  Procedure(s): COLONOSCOPY WITH PROPOFOL  (N/A) ESOPHAGOGASTRODUODENOSCOPY (EGD) WITH PROPOFOL  (N/A) as a surgical intervention.  The patient's history has been reviewed, patient examined, no change in status, stable for surgery.  I have reviewed the patient's chart and labs.  Questions were answered to the patient's satisfaction.     Elspeth Ozell Jungling

## 2023-10-11 NOTE — Anesthesia Procedure Notes (Signed)
 Procedure Name: General with mask airway Date/Time: 10/11/2023 9:42 AM  Performed by: Ledora Duncan, CRNAPre-anesthesia Checklist: Patient identified, Emergency Drugs available, Suction available and Patient being monitored Patient Re-evaluated:Patient Re-evaluated prior to induction Oxygen Delivery Method: Simple face mask Induction Type: IV induction Placement Confirmation: positive ETCO2, CO2 detector and breath sounds checked- equal and bilateral Dental Injury: Teeth and Oropharynx as per pre-operative assessment

## 2023-10-11 NOTE — Anesthesia Preprocedure Evaluation (Signed)
 Anesthesia Evaluation  Patient identified by MRN, date of birth, ID band Patient awake    Reviewed: Allergy & Precautions, NPO status , Patient's Chart, lab work & pertinent test results  History of Anesthesia Complications (+) PONV and history of anesthetic complications  Airway Mallampati: III  TM Distance: <3 FB Neck ROM: full    Dental  (+) Chipped, Implants   Pulmonary neg shortness of breath, former smoker   Pulmonary exam normal        Cardiovascular Exercise Tolerance: Good hypertension, (-) angina + CAD  (-) DOE Normal cardiovascular exam     Neuro/Psych negative neurological ROS  negative psych ROS   GI/Hepatic Neg liver ROS,GERD  Controlled,,  Endo/Other  diabetes    Renal/GU Renal disease  negative genitourinary   Musculoskeletal   Abdominal   Peds  Hematology negative hematology ROS (+)   Anesthesia Other Findings Past Medical History: No date: A-fib (HCC) No date: Acid reflux No date: Anginal pain (HCC) No date: Bladder cancer (HCC) No date: Bladder mass No date: Coronary artery disease 09/11/2019: COVID-19     Comment:  test done at [facility] No date: Dyspnea No date: GERD (gastroesophageal reflux disease) No date: History of biopsy of bladder No date: History of cystoscopy No date: HLD (hyperlipidemia) No date: Hypertension No date: Liver disease No date: Motion sickness     Comment:  boats No date: PONV (postoperative nausea and vomiting) No date: Pre-diabetes  Past Surgical History: No date: APPENDECTOMY 05/31/2023: BIOPSY     Comment:  Procedure: BIOPSY;  Surgeon: Onita Elspeth Sharper, DO;               Location: [facility] END[facility];  Service: Gastroenterology;; 12/10/2019: CATARACT EXTRACTION W/PHACO; Right     Comment:  Procedure: CATARACT EXTRACTION PHACO AND INTRAOCULAR               LENS PLACEMENT (IOC) RIGHT 5.43 00:53.3 10.2%;  Surgeon:               Mittie Gaskin, MD;  Location: [facility];              Service: Ophthalmology;  Laterality: Right;  has to go               day before surgery for covid testing per PAT testing No date: CHOLECYSTECTOMY 03/07/2017: CORONARY ARTERY BYPASS GRAFT     Comment:  3 vessel - [facility] 11/15/2015: CYSTOSCOPY WITH BIOPSY; N/A     Comment:  Procedure: CYSTOSCOPY WITH BIOPSY;  Surgeon: Rosina Riis, MD;  Location: [facility] ORS;  Service: Urology;                Laterality: N/A; 08/17/2023: ENTEROSCOPY; N/A     Comment:  Procedure: ENTEROSCOPY;  Surgeon: Therisa Bi, MD;                Location: [facility] END[facility]COPY;  Service: Gastroenterology;                Laterality: N/A; 05/31/2023: ESOPHAGOGASTRODUODENOSCOPY; N/A     Comment:  Procedure: ESOPHAGOGASTRODUODENOSCOPY (EGD);  Surgeon:               Onita Elspeth Sharper, DO;  Location: [facility] ENDOSCOPY;                Service: Gastroenterology;  Laterality: N/A; 08/12/2019: INTRAOPERATIVE CHOLANGIOGRAM     Comment:  Procedure: INTRAOPERATIVE CHOLANGIOGRAM;  Surgeon:  Jordis Laneta FALCON, MD;  Location: [facility] ORS;  Service:               General;; 05/23/2019: IR PERC CHOLECYSTOSTOMY 08/17/2023: LASER ABLATION CONDOLAMATA     Comment:  Procedure: LASER ABLATION CONDOLAMATA;  Surgeon: Therisa Bi, MD;  Location: [facility] ENDOSCOPY;  Service:               Gastroenterology;; 02/23/2017: LEFT HEART CATH AND CORONARY ANGIOGRAPHY; Left     Comment:  Procedure: Left Heart Cath and Coronary Angiography;                Surgeon: Ammon Blunt, MD;  Location: [facility]               INVASIVE CV LAB;  Service: Cardiovascular;  Laterality:               Left; 05/31/2023: MALONEY DILATION     Comment:  Procedure: MALONEY DILATION;  Surgeon: Onita Elspeth Sharper, DO;  Location: [facility] ENDOSCOPY;  Service:               Gastroenterology;; No date: TOE SURGERY  BMI    Body Mass Index: 21.63 kg/m       Reproductive/Obstetrics negative OB ROS                             Anesthesia Physical Anesthesia Plan  ASA: 3  Anesthesia Plan: General   Post-op Pain Management:    Induction: Intravenous  PONV Risk Score and Plan: Propofol  infusion and TIVA  Airway Management Planned: Natural Airway and Nasal Cannula  Additional Equipment:   Intra-op Plan:   Post-operative Plan:   Informed Consent: I have reviewed the patients History and Physical, chart, labs and discussed the procedure including the risks, benefits and alternatives for the proposed anesthesia with the patient or authorized representative who has indicated his/her understanding and acceptance.     Dental Advisory Given  Plan Discussed with: Anesthesiologist, CRNA and Surgeon  Anesthesia Plan Comments: (Patient consented for risks of anesthesia including but not limited to:  - adverse reactions to medications - risk of airway placement if required - damage to eyes, teeth, lips or other oral mucosa - nerve damage due to positioning  - sore throat or hoarseness - Damage to heart, brain, nerves, lungs, other parts of body or loss of life  Patient voiced understanding and assent.)       Anesthesia Quick Evaluation

## 2023-10-12 ENCOUNTER — Encounter: Payer: Self-pay | Admitting: Gastroenterology

## 2023-10-12 LAB — SURGICAL PATHOLOGY

## 2023-12-07 ENCOUNTER — Other Ambulatory Visit: Payer: Self-pay | Admitting: Urology

## 2024-03-27 ENCOUNTER — Other Ambulatory Visit: Payer: Self-pay | Admitting: Neurology

## 2024-03-27 DIAGNOSIS — G20A1 Parkinson's disease without dyskinesia, without mention of fluctuations: Secondary | ICD-10-CM

## 2024-03-29 ENCOUNTER — Ambulatory Visit
Admission: RE | Admit: 2024-03-29 | Discharge: 2024-03-29 | Disposition: A | Discharge status: Home / Self Care | Source: Ambulatory Visit | Attending: Neurology | Admitting: Neurology

## 2024-03-29 DIAGNOSIS — G20A1 Parkinson's disease without dyskinesia, without mention of fluctuations: Secondary | ICD-10-CM | POA: Insufficient documentation

## 2024-05-15 ENCOUNTER — Encounter: Payer: Self-pay | Admitting: Urology

## 2024-09-10 ENCOUNTER — Ambulatory Visit: Admitting: Urology

## 2024-09-11 ENCOUNTER — Ambulatory Visit: Payer: Self-pay | Admitting: Urology

## 2024-10-04 ENCOUNTER — Other Ambulatory Visit: Payer: Self-pay | Admitting: Physician Assistant

## 2024-10-04 DIAGNOSIS — N390 Urinary tract infection, site not specified: Secondary | ICD-10-CM

## 2024-10-15 ENCOUNTER — Ambulatory Visit: Admitting: Urology

## 2024-10-15 VITALS — BP 150/83 | HR 88

## 2024-10-15 DIAGNOSIS — Z8603 Personal history of neoplasm of uncertain behavior: Secondary | ICD-10-CM | POA: Diagnosis not present

## 2024-10-15 MED ORDER — LIDOCAINE HCL URETHRAL/MUCOSAL 2 % EX GEL
1.0000 | Freq: Once | CUTANEOUS | Status: AC
  Administered 2024-10-15: 1 via URETHRAL

## 2024-10-15 NOTE — Progress Notes (Signed)
 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 Cystoscopy was performed with the a flexible cystoscope. The urethra, bladder neck and entire bladder was visualized in a standard fashion, and mucosa grossly normal. The ureteral orifices were visualized in their normal location and orientation. Retroflexion normal.     CT urogram 08/03/2022 benign CT abdomen and pelvis November 2024 benign   PLAN:  She has since discontinued topical estradiol  cream as she felt this was not particularly helpful.  She continues on Gemtesa  but today does not think that has made much improvement in her overactive symptoms.  From the last note in January 2025 she reported the Gemtesa  was much more helpful than Vesicare  with improved urgency, frequency, and improved nocturia.  Also reportedly has a history of incomplete emptying which could be exacerbated by the Gemtesa .  She is on daily nitrofurantoin  UTI prophylaxis.  No recurrence -RTC 1 year cystoscopy, consider discontinuing screening 2027 per guideline recommendations -Stop Gemtesa , she is consuming large volumes of fluids and we reviewed behavioral strategies.  Close follow-up in 4 to 6 weeks with PA for PVR and symptom check.  If worsening symptoms can go back on the Gemtesa     Redell Burnet, MD 10/15/2024

## 2024-11-04 ENCOUNTER — Other Ambulatory Visit: Payer: Self-pay | Admitting: Physician Assistant

## 2024-11-04 DIAGNOSIS — N390 Urinary tract infection, site not specified: Secondary | ICD-10-CM

## 2024-11-12 ENCOUNTER — Ambulatory Visit: Admitting: Physician Assistant

## 2024-11-18 ENCOUNTER — Ambulatory Visit: Admitting: Urology

## 2025-10-15 ENCOUNTER — Other Ambulatory Visit: Admitting: Urology
